# Patient Record
Sex: Female | Born: 1950 | Race: White | Hispanic: No | Marital: Married | Smoking: Never smoker
Health system: Southern US, Community
[De-identification: ages and names within clinical notes are randomized; demographics above are authoritative.]

## PROBLEM LIST (undated history)

## (undated) DIAGNOSIS — S73004A Unspecified dislocation of right hip, initial encounter: Secondary | ICD-10-CM

## (undated) DIAGNOSIS — L719 Rosacea, unspecified: Secondary | ICD-10-CM

## (undated) DIAGNOSIS — K219 Gastro-esophageal reflux disease without esophagitis: Secondary | ICD-10-CM

## (undated) DIAGNOSIS — I7300 Raynaud's syndrome without gangrene: Secondary | ICD-10-CM

## (undated) DIAGNOSIS — M797 Fibromyalgia: Secondary | ICD-10-CM

## (undated) DIAGNOSIS — J309 Allergic rhinitis, unspecified: Secondary | ICD-10-CM

## (undated) DIAGNOSIS — I493 Ventricular premature depolarization: Secondary | ICD-10-CM

## (undated) DIAGNOSIS — L718 Other rosacea: Secondary | ICD-10-CM

## (undated) DIAGNOSIS — G4733 Obstructive sleep apnea (adult) (pediatric): Secondary | ICD-10-CM

## (undated) DIAGNOSIS — M79671 Pain in right foot: Secondary | ICD-10-CM

## (undated) DIAGNOSIS — Z9289 Personal history of other medical treatment: Secondary | ICD-10-CM

## (undated) DIAGNOSIS — G43909 Migraine, unspecified, not intractable, without status migrainosus: Secondary | ICD-10-CM

## (undated) DIAGNOSIS — I10 Essential (primary) hypertension: Secondary | ICD-10-CM

## (undated) DIAGNOSIS — S73005A Unspecified dislocation of left hip, initial encounter: Secondary | ICD-10-CM

## (undated) HISTORY — PX: TOTAL HIP ARTHROPLASTY: SHX124

## (undated) HISTORY — DX: Raynaud's syndrome without gangrene: I73.00

## (undated) HISTORY — PX: VAGINAL HYSTERECTOMY: SUR661

## (undated) HISTORY — DX: Rosacea, unspecified: L71.9

## (undated) HISTORY — PX: HIP SURGERY: SHX245

## (undated) HISTORY — DX: Obstructive sleep apnea (adult) (pediatric): G47.33

## (undated) HISTORY — DX: Other rosacea: L71.8

## (undated) HISTORY — PX: HIP ARTHROPLASTY: SHX981

## (undated) HISTORY — PX: CARDIAC CATHETERIZATION: SHX172

## (undated) HISTORY — DX: Migraine, unspecified, not intractable, without status migrainosus: G43.909

## (undated) HISTORY — DX: Allergic rhinitis, unspecified: J30.9

## (undated) HISTORY — DX: Ventricular premature depolarization: I49.3

## (undated) HISTORY — DX: Unspecified dislocation of right hip, initial encounter: S73.005A

## (undated) HISTORY — DX: Unspecified dislocation of right hip, initial encounter: S73.004A

## (undated) HISTORY — DX: Essential (primary) hypertension: I10

## (undated) HISTORY — PX: BREAST BIOPSY: SHX20

## (undated) HISTORY — DX: Fibromyalgia: M79.7

## (undated) HISTORY — DX: Personal history of other medical treatment: Z92.89

## (undated) HISTORY — DX: Gastro-esophageal reflux disease without esophagitis: K21.9

## (undated) HISTORY — DX: Pain in right foot: M79.671

## (undated) HISTORY — PX: LEG SURGERY: SHX1003

---

## 1998-05-11 ENCOUNTER — Ambulatory Visit (HOSPITAL_COMMUNITY): Admission: RE | Admit: 1998-05-11 | Discharge: 1998-05-11 | Payer: Self-pay | Admitting: Orthopaedic Surgery

## 1999-02-01 ENCOUNTER — Ambulatory Visit (HOSPITAL_COMMUNITY): Admission: RE | Admit: 1999-02-01 | Discharge: 1999-02-01 | Payer: Self-pay | Admitting: *Deleted

## 1999-10-10 ENCOUNTER — Ambulatory Visit (HOSPITAL_COMMUNITY): Admission: RE | Admit: 1999-10-10 | Discharge: 1999-10-10 | Payer: Self-pay | Admitting: *Deleted

## 2000-02-06 ENCOUNTER — Encounter: Payer: Self-pay | Admitting: Family Medicine

## 2000-02-06 ENCOUNTER — Ambulatory Visit (HOSPITAL_COMMUNITY): Admission: RE | Admit: 2000-02-06 | Discharge: 2000-02-06 | Payer: Self-pay | Admitting: Family Medicine

## 2001-02-09 ENCOUNTER — Ambulatory Visit (HOSPITAL_COMMUNITY): Admission: RE | Admit: 2001-02-09 | Discharge: 2001-02-09 | Payer: Self-pay | Admitting: Family Medicine

## 2001-02-09 ENCOUNTER — Encounter: Payer: Self-pay | Admitting: Family Medicine

## 2002-02-11 ENCOUNTER — Ambulatory Visit (HOSPITAL_COMMUNITY): Admission: RE | Admit: 2002-02-11 | Discharge: 2002-02-11 | Payer: Self-pay | Admitting: Obstetrics & Gynecology

## 2002-02-11 ENCOUNTER — Encounter: Payer: Self-pay | Admitting: Obstetrics & Gynecology

## 2002-03-31 ENCOUNTER — Encounter: Payer: Self-pay | Admitting: Orthopedic Surgery

## 2002-04-05 ENCOUNTER — Encounter: Payer: Self-pay | Admitting: Orthopedic Surgery

## 2002-04-05 ENCOUNTER — Inpatient Hospital Stay (HOSPITAL_COMMUNITY): Admission: RE | Admit: 2002-04-05 | Discharge: 2002-04-09 | Payer: Self-pay | Admitting: Orthopedic Surgery

## 2003-03-14 ENCOUNTER — Encounter: Payer: Self-pay | Admitting: Obstetrics & Gynecology

## 2003-03-14 ENCOUNTER — Ambulatory Visit (HOSPITAL_COMMUNITY): Admission: RE | Admit: 2003-03-14 | Discharge: 2003-03-14 | Payer: Self-pay | Admitting: Obstetrics & Gynecology

## 2003-10-12 ENCOUNTER — Other Ambulatory Visit: Admission: RE | Admit: 2003-10-12 | Discharge: 2003-10-12 | Payer: Self-pay | Admitting: Obstetrics & Gynecology

## 2004-04-02 ENCOUNTER — Ambulatory Visit (HOSPITAL_COMMUNITY): Admission: RE | Admit: 2004-04-02 | Discharge: 2004-04-02 | Payer: Self-pay | Admitting: Obstetrics & Gynecology

## 2004-04-05 ENCOUNTER — Encounter: Admission: RE | Admit: 2004-04-05 | Discharge: 2004-04-05 | Payer: Self-pay | Admitting: Obstetrics & Gynecology

## 2004-07-10 ENCOUNTER — Ambulatory Visit (HOSPITAL_COMMUNITY): Admission: RE | Admit: 2004-07-10 | Discharge: 2004-07-10 | Payer: Self-pay | Admitting: Gastroenterology

## 2005-04-03 ENCOUNTER — Ambulatory Visit (HOSPITAL_COMMUNITY): Admission: RE | Admit: 2005-04-03 | Discharge: 2005-04-03 | Payer: Self-pay | Admitting: Obstetrics & Gynecology

## 2005-06-03 ENCOUNTER — Other Ambulatory Visit: Admission: RE | Admit: 2005-06-03 | Discharge: 2005-06-03 | Payer: Self-pay | Admitting: Obstetrics & Gynecology

## 2005-07-25 ENCOUNTER — Encounter: Admission: RE | Admit: 2005-07-25 | Discharge: 2005-07-25 | Payer: Self-pay | Admitting: Obstetrics & Gynecology

## 2006-04-04 ENCOUNTER — Ambulatory Visit (HOSPITAL_COMMUNITY): Admission: RE | Admit: 2006-04-04 | Discharge: 2006-04-04 | Payer: Self-pay | Admitting: Obstetrics & Gynecology

## 2007-09-02 ENCOUNTER — Ambulatory Visit (HOSPITAL_COMMUNITY): Admission: RE | Admit: 2007-09-02 | Discharge: 2007-09-02 | Payer: Self-pay | Admitting: Family Medicine

## 2009-01-05 ENCOUNTER — Ambulatory Visit (HOSPITAL_COMMUNITY): Admission: RE | Admit: 2009-01-05 | Discharge: 2009-01-05 | Payer: Self-pay | Admitting: Obstetrics & Gynecology

## 2009-10-11 ENCOUNTER — Ambulatory Visit (HOSPITAL_BASED_OUTPATIENT_CLINIC_OR_DEPARTMENT_OTHER): Admission: RE | Admit: 2009-10-11 | Discharge: 2009-10-11 | Payer: Self-pay | Admitting: Rheumatology

## 2009-10-15 ENCOUNTER — Ambulatory Visit: Payer: Self-pay | Admitting: Internal Medicine

## 2010-02-15 ENCOUNTER — Ambulatory Visit (HOSPITAL_COMMUNITY): Admission: RE | Admit: 2010-02-15 | Discharge: 2010-02-15 | Payer: Self-pay | Admitting: Obstetrics & Gynecology

## 2010-12-16 ENCOUNTER — Encounter: Payer: Self-pay | Admitting: Obstetrics & Gynecology

## 2011-04-12 NOTE — H&P (Signed)
[facility]  Patient:    Emily Page, Emily Page Visit Number: [id number] MRN: [id number]          Service Type: Attending:  Ollen Gross, M.D. Dictated by:   Druscilla Brownie. Shela Nevin, P.A. Adm. Date:  04/05/02   CC:         Dorthula Perfect, M.D.  Vanita Panda, M.D.   History and Physical  DATE OF BIRTH:  Dec 16, 1950  CHIEF COMPLAINT:  Pain in right hip.  HISTORY OF PRESENT ILLNESS:  The patient is a 60 year old female with hip dysplasia since an infant.  It has progressed to the point where she now has severe problems with her right hip.  She also has problems on the left as well.  Her pain is refractory to nonoperative management.  After much discussion with Dr. Lequita Halt, it was decided the patient would benefit from hip replacement.  She is a relatively young lady, and this hip is markedly interfering with her day-to-day activities.  She has lost range of motion of the right hip with flexion to 95%, rotation to 20, and abduction about 30. She walks with an antalgic gait pattern as well.  She has been seen by Dr. Norlene Campbell for a second opinion, and he agrees that surgical intervention is indicated.  The patient is scheduled for total hip replacement arthroplasty of the right hip.  PAST SURGICAL HISTORY: 1. In 1953, she had bilateral hips casted. 2. Surgical intervention in 1957 for "straightening of the legs." 3. Wisdom teeth removed in 1967. 4. D&C in 1973. 5. Hysterectomy in 1975.  PAST MEDICAL HISTORY: 1. Migraine headaches.  Treated with medications by Dr. Lilian Kapur. 2. Postnasal drip and a dry, tight cough. 3. GERD, and takes the appropriate medication. 4. Fibromyalgia, takes Celebrex for that, which she will stop prior to    surgery.  ALLERGIES:  ZANAFLEX causes hallucinations.  PREMARIN causes nausea. Extremely sensitive to ALL ADHESIVE TAPES.  She is requesting a hypoallergenic tape and, if we do not have  that, then paper tape will be used.  CURRENT MEDICATIONS:  1. ______ 500 mg 1 tablet q.6h. p.r.n., usually takes one at night.  2. Celebrex 200 mg 1 capsule b.i.d.  3. Sonata 10 mg 1 q.d.  4. ______ 20 mg 1 capsule q.d.  5. Nasacort AQ spray 6.5 g 2 sprays each nostril q.d.  6. Astelin nasal spray 137 mcg 2 sprays each nostril b.i.d.  7. Medroxyprogesterone 2.5 mg 1 tablet q.d.  8. Estratest H.S. tablet solution 1 tablet q.d.  9. Vivelle patch 0.1 mg uses twice weekly. 10. Vagifem 25 mcg vaginal tablet use at bedtime two or three times a week. 11. Noritate 1% cream applied to face daily. 12. Relpax 40 mg tablets at start of migraine, repeat in two hours.  That is a     p.r.n. drug.  FAMILY HISTORY:  Positive for heart disease in the father and grandmother. Hypertension in the mother and grandmother.  Diabetes in the father. Grandfather with lymphoma.  Breast cancer in the grandmother.  The grandmother also had strokes and TIAs.  Arthritis in the mother and grandmother.  Paternal grandfather had bleeding ulcers.  SOCIAL HISTORY:  The patient is a married homemaker.  Has two grown children. Has no intake of tobacco products.  Has 0-2 glasses of wine q.d.  She lives in a ranch-style home.  REVIEW OF SYSTEMS:  CNS:  No seizure disorder, paralysis, numbness, or double vision.  The  patient does have migraines.  CARDIOVASCULAR:  No chest pain, no angina, no orthopnea, but she tells me that she has a history of "abnormal EKG" in her previous surgeries back in the 1970s.  CARDIORESPIRATORY:  No productive cough, primarily a dry cough which she attributes to postnasal drip which has been quite chronic.  No hemoptysis, no shortness of breath. GASTROINTESTINAL:  No nausea, vomiting, melena, or bloody stools.  She does quite well when she takes her medications for GERD.  GENITOURINARY:  No discharge, no dysuria, no hematuria.  MUSCULOSKELETAL:  Primarily in present illness.  PHYSICAL  EXAMINATION:  GENERAL:  Alert and cooperative, friendly 60 year old white female who is somewhat apprehensive.  VITAL SIGNS:  Blood pressure 130/82, pulse 60, respirations 12.  HEENT:  Normocephalic.  Oropharynx is clear.  There is some redness in the palatine pillars and some clear vesicles in the posterior pharynx.  No pustules seen.  No injection.  HEART:  Regular rate and rhythm.  No murmurs are heard.  LUNGS:  Clear to auscultation.  No rhonchi, no wheezes.  ABDOMEN:  Soft and nontender.  Spleen not felt.  GENITOURINARY, RECTAL, PELVIC, BREASTS:  Not done.  Not pertinent to present illness.  EXTREMITIES:  Left hip as in present illness above.  ADMISSION DIAGNOSES: 1. 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 2. Migraine headaches. 3. Gastroesophageal reflux disease. 4. Fibromyalgia. 5. 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  PLAN:  The patient will undergo right total hip replacement arthroplasty.  We plan to have home health, utilizing the services of [facility] after surgery.  We must be extremely cautious postoperatively not to use any of the usual tapes. If hypoallergenic tape cannot be found, then we need to use paper tape.  I told her to bring her nasal sprays as well as her creams and her Vagifem to the hospital with her, and we will order this postoperatively.  Due to her fibromyalgia, which is treated quite well with Celebrex, we may want to notify pharmacy and continue her Celebrex postoperatively. Dictated by:   Druscilla Brownie. Shela Nevin, P.A. Attending:  Ollen Gross, M.D. DD:  03/31/02 TD:  04/01/02 Job: [id number]

## 2011-04-12 NOTE — Op Note (Signed)
NAMEROBINETTE, ESTERS                       ACCOUNT NO.:  [id number]   MEDICAL RECORD NO.:  [id number]                   PATIENT TYPE:  AMB   LOCATION:  ENDO                                 FACILITY:  [facility]   PHYSICIAN:  James L. Malon Kindle., M.D.          DATE OF BIRTH:  06-27-1951   DATE OF PROCEDURE:  07/10/2004  DATE OF DISCHARGE:                                 OPERATIVE REPORT   PROCEDURE PERFORMED:  Colonoscopy.   ENDOSCOPIST:  Llana Aliment. Edwards, M.D.   MEDICATIONS:  Fentanyl 90 mcg, Versed 9 mg IV.  Ancef 1 g IV due to previous  hip replacement.   INSTRUMENT USED:  Pediatric Olympus colonoscope.   INDICATIONS FOR PROCEDURE:  Colon cancer screening.   DESCRIPTION OF PROCEDURE:  The procedure had been explained to the patient  and consent obtained.  With the patient in the left lateral decubitus  position, the Olympus scope was inserted and advanced under direct  visualization.  The prep was excellent.  We were able to reach the cecum  using abdominal pressure and position changes.  The scope was withdrawn and  the cecum, ascending colon, transverse colon, splenic flexure, descending  and sigmoid colon were seen well upon removal.  No polyps or other lesions  were seen.  The scope was withdrawn down to the rectum.  The rectum was free  of polyps or other lesions.  The scope was withdrawn.  The patient tolerated  the procedure well.   ASSESSMENT:  Normal screening colonoscopy.   PLAN:  Routine post colonoscopy instructions.  Will recommend yearly  Hemoccults as well.                                               James L. Malon Kindle., M.D.    Waldron Session  D:  07/10/2004  T:  07/10/2004  Job:  [id number]   cc:   Al Decant. Janey Greaser, MD  [address]  [place]  [place] [address]  Fax: [phone]

## 2011-04-12 NOTE — Discharge Summary (Signed)
[facility]  Patient:    Emily Page, Emily Page Visit Number: [id number] MRN: [id number]          Service Type: SUR Location: 4W 0454 01 Attending Physician:  Loanne Drilling Dictated by:   Druscilla Brownie Underwood III, P.A.-C. Admit Date:  04/05/2002 Discharge Date: 04/09/2002   CC:         Jackey Loge, Ph.D.  Vanita Panda, M.D.   Discharge Summary  ADMITTING DIAGNOSES: 1. 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 2. Migraine headaches. 3. Gastroesophageal reflux disease. 4. Fibromyalgia. 5. Extreme sensory ______________ .  DISCHARGE DIAGNOSES: 1. 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 2. Migraine headaches. 3. Gastroesophageal reflux disease. 4. Fibromyalgia. 5. Extreme sensory ______________ . 6. Mild postoperative anemia.  OPERATION:  On Apr 05, 2002, the patient underwent right total hip replacement arthroplasty; French Ana Shuford, P.A.-C. assisted.  CONSULTATIONS:  None.  BRIEF HISTORY OF PRESENT ILLNESS:  This 60 year old lady with hip dysplasia since an infant now has severe problems of the right hip.  Her pain as well as treatment of the right hip nonoperatively has been refractory without much success.  After much discussion, it was felt this patient would benefit with total hip replacement arthroplasty and after a second opinion agreeing with this procedure by Dr. Norlene Campbell surgery was planned.  COURSE IN THE HOSPITAL:  The patient tolerated surgical procedure quite well, was in total hip protocol and eager to progress.  The Hemovac was pulled on the first postop day without difficulty and PCA was discontinued as well. P.o. analgesics controlled this patients discomfort.  She was placed on the total hip protocol and did quite well.  The wound remained dry.  Hemoglobin was stable with the slight expected reduction postoperatively.  Felt this patient could be maintained in home environment and arrangements  were made for discharge.  She will have home health by [place].  She was afebrile and stable at discharge.  LABORATORY DATA:  Laboratory values in the hospital hematologically showed a CBC preoperatively which was 13 and was within normal limits, hemoglobin was 13.9, hematocrit was 40.2.  Final hemoglobin was 11.1 with hematocrit of 32.2. Blood chemistries were in normal limits.  Urinalysis was negative for urinary tract infection.  Electrocardiogram showed normal sinus rhythm and the chest x-ray showed no evidence of active disease in the chest.  CONDITION ON DISCHARGE:  Improved, stable.  PLAN:  The patient was discharged to her home in the care of her family as well as home health.  She is to continue with her home medications and diet. She will have Coumadin for 3 weeks after date of surgery per pharmacy as well as Genevieve Norlander managing this.  Robaxin 500 mg #40 one q.6h. p.r.n. muscle spasm, Vicodin ES #40 with a refill one q.4h. p.r.n. pain, and Benadryl p.r.n.  She will use cortisone cream p.r.n. to her rash area.  A 3-in-1 commode and rolling walker will be used at home. Dictated by:   Druscilla Brownie. Underwood III, P.A.-C. Attending Physician:  Loanne Drilling DD:  04/26/02 TD:  04/27/02 Job: [id number]

## 2011-04-12 NOTE — Op Note (Signed)
[facility]  Patient:    Emily Page, Emily Page Visit Number: [id number] MRN: [id number]          Service Type: SUR Location: 1S Y782 [id number] Attending Physician:  Loanne Drilling Dictated by:   Ollen Gross, M.D. Proc. Date: 04/05/02 Admit Date:  04/05/2002                             Operative Report  PREOPERATIVE DIAGNOSIS:  Right hip dysplasia with degenerative change.  POSTOPERATIVE DIAGNOSIS:  Right hip dysplasia with degenerative change.  PROCEDURE:  Right total hip arthroplasty.  SURGEON:  Ollen Gross, M.D.  ASSISTANT:  Ralene Bathe, P.A.  ANESTHESIA:  General.  ESTIMATED BLOOD LOSS:  300 cc.  DRAIN:  Hemovac x 1.  COMPLICATIONS:  None.  CONDITION:  Stable to recovery.  BRIEF CLINICAL NOTE:  Emily Page is a 60 year old female with severe dysplasia of her right hip and now with degenerative change and progressive subluxation. She has had pain refractory to nonoperative management and presents for total hip arthroplasty.  PROCEDURE IN DETAIL:  After successful administration of general anesthetic, the patient was placed in the left lateral decubitus position with the right side up and held with the hip positioner. The right lower extremity is isolated for her perineum with plastic drapes and prepped and draped in the usual sterile fashion. A standard short posterolateral incision is made with a #10 blade through subcutaneous tissue to the level of the fascia lata which is incised in line with the skin incision. Sciatic nerve is palpated and protected. External rotators isolated off the femur. The capsulectomy is performed and there is a tremendous amount of redundant capsule. Hip is dislocated and center of the femoral head marked. Trial prosthesis is placed such that the center of the trial head corresponds to the center of her native femoral head. An osteotomy line is marked on the femoral neck and then osteotomy made with an oscillating  saw. Femoral head and neck removed. The anterior labrum, which is severely hypertrophic and torn, is removed. Superior labrum is removed. There is a superior bony defect present. Anterior and posterior columns are fine. Acetabular exposure is obtained and the reaming is initiated centrally with a 45 mm reamer coursing in increments of two to a 51 mm. A 52 mm Pinnacle acetabular shell is then placed and approximately 40 degrees of abduction, 20 degrees of forward flexion. There is great coverage on the anterior and posterior column and the overwhelming majority of the superior portion is covered, too. We had a great tight fit once impacted. I then transfixed it with two additional dome screws. I then put the insertion device on there and the cup was moving with the entire pelvis, thus, we had a very stable fit. The 28 mm neutral trial liner is then placed.  Femoral preparation is initiated, first with a canal finder. Canal is irrigated and then axial reaming performed up to 13.5 mm. Proximal reaming is performed until an 18D and the sleeve machined to a small. An 18D small trial sleeve is placed with 18 x 13 stem and a 36 standard neck. A trial 28+0 head is placed. Hip is reduced and there is outstanding stability, full extension, full external rotation, 70 degrees flexion, 40 degrees adduction, 90 degrees internal rotation and 90 degrees flexion, 90 degrees internal rotation. Hip is dislocated, all trials are removed, then a permanent apex hole eliminator and permanent 28 mm neutral  Marathon liner are placed. The permanent 18D small sleeve is placed with an 18 x 13 stem, 36 standard neck, matching her native anteversion. Permanent 28+0 head is placed, hip reduced, and excellent stability again noted. The wound is copiously irrigated with antibiotic solution and short rotators reattached to the femur through drill holes. The fascia lata is closed over Hemovac drain with interrupted #1  Vicryl, subcutaneous closed interrupted 2-0 Vicryl, subcuticular running 4-0 Monocryl. Incision is clean, dry, and Steri-Strips and bulky sterile dressing applied. The patient is awakened and transported to recovery room in stable condition. Dictated by:   Ollen Gross, M.D. Attending Physician:  Loanne Drilling DD:  04/05/02 TD:  04/05/02 Job: [id number]

## 2012-05-12 ENCOUNTER — Other Ambulatory Visit: Payer: Self-pay | Admitting: Obstetrics & Gynecology

## 2012-05-12 DIAGNOSIS — N6310 Unspecified lump in the right breast, unspecified quadrant: Secondary | ICD-10-CM

## 2012-05-18 ENCOUNTER — Ambulatory Visit
Admission: RE | Admit: 2012-05-18 | Discharge: 2012-05-18 | Disposition: A | Discharge status: Home / Self Care | Payer: BC Managed Care – PPO | Source: Ambulatory Visit | Attending: Obstetrics & Gynecology | Admitting: Obstetrics & Gynecology

## 2012-05-18 ENCOUNTER — Other Ambulatory Visit: Payer: Self-pay | Admitting: Obstetrics & Gynecology

## 2012-05-18 DIAGNOSIS — N6310 Unspecified lump in the right breast, unspecified quadrant: Secondary | ICD-10-CM

## 2014-02-02 DIAGNOSIS — R739 Hyperglycemia, unspecified: Secondary | ICD-10-CM | POA: Insufficient documentation

## 2014-03-03 ENCOUNTER — Telehealth: Payer: Self-pay | Admitting: Hematology & Oncology

## 2014-03-03 NOTE — Telephone Encounter (Signed)
Spoke w NEW PATIENT today to remind them of their appointment with Dr. Ennever. Also, advised them to bring all meds and insurance information. ° °

## 2014-03-04 ENCOUNTER — Ambulatory Visit (HOSPITAL_BASED_OUTPATIENT_CLINIC_OR_DEPARTMENT_OTHER): Payer: BC Managed Care – PPO | Admitting: Hematology & Oncology

## 2014-03-04 ENCOUNTER — Ambulatory Visit: Payer: BC Managed Care – PPO

## 2014-03-04 ENCOUNTER — Encounter: Payer: Self-pay | Admitting: Hematology & Oncology

## 2014-03-04 ENCOUNTER — Other Ambulatory Visit (HOSPITAL_BASED_OUTPATIENT_CLINIC_OR_DEPARTMENT_OTHER): Payer: BC Managed Care – PPO | Admitting: Lab

## 2014-03-04 ENCOUNTER — Other Ambulatory Visit (HOSPITAL_COMMUNITY)
Admission: RE | Admit: 2014-03-04 | Discharge: 2014-03-04 | Disposition: A | Discharge status: Home / Self Care | Payer: BC Managed Care – PPO | Source: Ambulatory Visit | Attending: Hematology & Oncology | Admitting: Hematology & Oncology

## 2014-03-04 VITALS — BP 132/62 | HR 61 | Temp 97.9°F | Resp 14 | Ht 62.0 in | Wt 120.0 lb

## 2014-03-04 DIAGNOSIS — D72820 Lymphocytosis (symptomatic): Secondary | ICD-10-CM

## 2014-03-04 LAB — CBC WITH DIFFERENTIAL (CANCER CENTER ONLY)
BASO#: 0 10*3/uL (ref 0.0–0.2)
BASO%: 0.4 % (ref 0.0–2.0)
EOS ABS: 0.1 10*3/uL (ref 0.0–0.5)
EOS%: 1.3 % (ref 0.0–7.0)
HEMATOCRIT: 41.3 % (ref 34.8–46.6)
HEMOGLOBIN: 14 g/dL (ref 11.6–15.9)
LYMPH#: 2.9 10*3/uL (ref 0.9–3.3)
LYMPH%: 53.2 % — AB (ref 14.0–48.0)
MCH: 30.2 pg (ref 26.0–34.0)
MCHC: 33.9 g/dL (ref 32.0–36.0)
MCV: 89 fL (ref 81–101)
MONO#: 0.7 10*3/uL (ref 0.1–0.9)
MONO%: 13 % (ref 0.0–13.0)
NEUT%: 32.1 % — ABNORMAL LOW (ref 39.6–80.0)
NEUTROS ABS: 1.7 10*3/uL (ref 1.5–6.5)
PLATELETS: 239 10*3/uL (ref 145–400)
RBC: 4.63 10*6/uL (ref 3.70–5.32)
RDW: 15.2 % (ref 11.1–15.7)
WBC: 5.4 10*3/uL (ref 3.9–10.0)

## 2014-03-04 LAB — CHCC SATELLITE - SMEAR

## 2014-03-04 NOTE — Progress Notes (Signed)
Referral MD  Reason for Referral: Abnormal blood counts   Chief Complaint  Patient presents with  . NEW PATIENT    high Lymphocytes for last 3 months  : I have not been feeling well. I had mono. My blood counts still or not normal.  HPI: Ms. Emily Page is a very nice 63 year old white female. She does have multiple medical problems. She is on a few medications.  She been doing okay until last October when she began to have some sinus infections. His on antibiotics in October and in December.  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 She apparently had abnormal liver tests. From what she says, her doctor did a mono test which was positive. She was noted to have a high amount of lymphocytes back in January. Addition 72% lymphocytes. Her white cell count was 10.2. She was not anemic. There is no thrombocytopenia.  She's not noted any swollen glands. She says when she has sinus infections, that she does have some swollen glands of her neck. There's been no rashes. She's had no significant weight loss. Her appetite has been okay. She's had no abdominal pain. There's been no change in bowel or bladder habits.  There's been no fever. She's had no bleeding. She's had occasional headaches.     No past medical history on file.:  No past surgical history on file.:  Current outpatient prescriptions:amLODipine (NORVASC) 5 MG tablet, Take 5 mg by mouth daily. , Disp: , Rfl: ;  aspirin 81 MG tablet, Take 81 mg by mouth daily., Disp: , Rfl: ;  azelastine (ASTELIN) 137 MCG/SPRAY nasal spray, Place 2 sprays into both nostrils 2 (two) times daily. Use in each nostril as directed, Disp: , Rfl: ;  diclofenac sodium (VOLTAREN) 1 % GEL, Apply 2 g topically as needed., Disp: , Rfl:  eletriptan (RELPAX) 40 MG tablet, Take 40 mg by mouth as needed for migraine or headache. One tablet by mouth at onset of headache. May repeat in 2 hours, Disp: , Rfl: ;  esomeprazole (NEXIUM) 40 MG capsule, Take by  mouth 2 (two) times daily before a meal. , Disp: , Rfl: ;  fluconazole (DIFLUCAN) 150 MG tablet, Take 150 mg by mouth as needed., Disp: , Rfl:  ketoconazole (NIZORAL) 2 % cream, Apply 1 application topically daily as needed for irritation., Disp: , Rfl: ;  ketoconazole (NIZORAL) 2 % shampoo, Apply 1 application topically 2 (two) times a week. , Disp: , Rfl: ;  LYRICA 50 MG capsule, Take 50 mg by mouth daily. , Disp: , Rfl: ;  magnesium 30 MG tablet, Take 30 mg by mouth 2 (two) times daily., Disp: , Rfl: ;  metronidazole (NORITATE) 1 % cream, Apply 1 % topically daily., Disp: , Rfl:  MINIVELLE 0.1 MG/24HR patch, Place 1 patch onto the skin 2 (two) times a week. , Disp: , Rfl: ;  NASONEX 50 MCG/ACT nasal spray, Place 2 sprays into the nose daily. , Disp: , Rfl: ;  RESTASIS 0.05 % ophthalmic emulsion, Place 1 drop into both eyes 2 (two) times daily. , Disp: , Rfl: ;  VITAMIN A PO, Take 2,000 Units by mouth every morning., Disp: , Rfl: :  :  Allergies  Allergen Reactions  . Penicillins     Swelling of throat  . Sulfa Antibiotics     Swelling of eyes  . Zanaflex [Tizanidine]     halluciations  :  No family history on file.:  History   Social History  .  Marital Status: Married    Spouse Name: N/A    Number of Children: N/A  . Years of Education: N/A   Occupational History  . Not on file.   Social History Main Topics  . Smoking status: Never Smoker   . Smokeless tobacco: Never Used     Comment: never used tobacco  . Alcohol Use: Not on file  . Drug Use: Not on file  . Sexual Activity: Not on file   Other Topics Concern  . Not on file   Social History Narrative  . No narrative on file  :  Pertinent items are noted in HPI.  Exam: @[facility] @  petite white female in no obvious distress. Vital signs show a temperature of 97.9. Pulse 61. Blood pressure 132/60 weight is 120 pounds. Head exam shows no ocular or oral lesions. There is no adenopathy on her neck. There is no scleral  icterus. Lungs are clear. Cardiac exam regular rate and rhythm with a normal S1-S2. There are no murmurs rubs or bruits. Abdomen is soft. She has good bowel sounds. There is no fluid wave. There is a palpable liver or spleen tip. Axillary exam shows no bilateral axillary lymph nodes. Back exam no tenderness over the spine ribs or hips. Extremities shows no clubbing cyanosis or edema. Has good range of motion of her joints. She is no venous cord in her legs. She has good pulses in her distal extremities. Skin exam no rashes ecchymoses or petechia. Neurological exam shows no focal neurological deficits.    Recent Labs  03/04/14 1032  WBC 5.4  HGB 14.0  HCT 41.3  PLT 239   No results found for this basename: NA, K, CL, CO2, GLUCOSE, BUN, CREATININE, CALCIUM,  in the last 72 hours  Blood smear review: Normochromic and normocytic red cells. There is no nucleated red cells. There are no target cells. I see no rouleau formation. White cells show a mild increase in lymphocytes. She has a couple atypical lymphocytes. I see no smudge cells. Myeloid cells appeared normal. There are no hypersegmented polys. This are adequate in number and size.  Pathology: None     Assessment and Plan: Emily Page is a 63 year old female. She has mild lymphocytosis. Her white cell count not elevated. Compared to January, prolymphocytes are coming down.  I am sending her  blood for flow cytometry. I would think however that is probably will be negative.  I still think that the changes in her  CBC are probably reflective of whenever syndrome that she had back in January. I cannot find anything on her physical exam that would suggest an underlying malignancy. I would be surprised if she had a monoclonal population of cells on her flow cytometry.  I spent a good 45 minutes with her and her husband. I answered all their questions. I went over my recommendations. We will see what the flow cytometry shows. If this is positive,  then she will likely need scans and a bone marrow test.  I reassured her. Again, I would think that we are not looking at any underlying marrow abnormalities.  I gave her a prayer blanket. She is very appreciative of this.

## 2014-03-11 LAB — FLOW CYTOMETRY - CHCC SATELLITE

## 2014-03-30 ENCOUNTER — Ambulatory Visit: Payer: BC Managed Care – PPO | Admitting: Cardiology

## 2014-03-31 ENCOUNTER — Encounter: Payer: Self-pay | Admitting: General Surgery

## 2014-03-31 DIAGNOSIS — I493 Ventricular premature depolarization: Secondary | ICD-10-CM

## 2014-03-31 DIAGNOSIS — R079 Chest pain, unspecified: Secondary | ICD-10-CM

## 2014-03-31 DIAGNOSIS — R0789 Other chest pain: Secondary | ICD-10-CM | POA: Insufficient documentation

## 2014-03-31 DIAGNOSIS — I119 Hypertensive heart disease without heart failure: Secondary | ICD-10-CM

## 2014-04-14 ENCOUNTER — Ambulatory Visit (INDEPENDENT_AMBULATORY_CARE_PROVIDER_SITE_OTHER): Payer: BC Managed Care – PPO | Admitting: Cardiology

## 2014-04-14 ENCOUNTER — Encounter: Payer: Self-pay | Admitting: Cardiology

## 2014-04-14 ENCOUNTER — Encounter: Payer: Self-pay | Admitting: *Deleted

## 2014-04-14 VITALS — BP 134/66 | HR 64 | Ht 62.0 in

## 2014-04-14 DIAGNOSIS — K219 Gastro-esophageal reflux disease without esophagitis: Secondary | ICD-10-CM | POA: Insufficient documentation

## 2014-04-14 DIAGNOSIS — M542 Cervicalgia: Secondary | ICD-10-CM | POA: Insufficient documentation

## 2014-04-14 DIAGNOSIS — I493 Ventricular premature depolarization: Secondary | ICD-10-CM

## 2014-04-14 DIAGNOSIS — M2550 Pain in unspecified joint: Secondary | ICD-10-CM | POA: Insufficient documentation

## 2014-04-14 DIAGNOSIS — I7300 Raynaud's syndrome without gangrene: Secondary | ICD-10-CM | POA: Insufficient documentation

## 2014-04-14 DIAGNOSIS — J309 Allergic rhinitis, unspecified: Secondary | ICD-10-CM | POA: Insufficient documentation

## 2014-04-14 DIAGNOSIS — I10 Essential (primary) hypertension: Secondary | ICD-10-CM | POA: Insufficient documentation

## 2014-04-14 DIAGNOSIS — F1990 Other psychoactive substance use, unspecified, uncomplicated: Secondary | ICD-10-CM | POA: Insufficient documentation

## 2014-04-14 DIAGNOSIS — G43009 Migraine without aura, not intractable, without status migrainosus: Secondary | ICD-10-CM | POA: Insufficient documentation

## 2014-04-14 DIAGNOSIS — M25549 Pain in joints of unspecified hand: Secondary | ICD-10-CM | POA: Insufficient documentation

## 2014-04-14 DIAGNOSIS — R5383 Other fatigue: Secondary | ICD-10-CM | POA: Insufficient documentation

## 2014-04-14 DIAGNOSIS — R0789 Other chest pain: Secondary | ICD-10-CM

## 2014-04-14 DIAGNOSIS — I4949 Other premature depolarization: Secondary | ICD-10-CM

## 2014-04-14 DIAGNOSIS — J301 Allergic rhinitis due to pollen: Secondary | ICD-10-CM | POA: Insufficient documentation

## 2014-04-14 LAB — BASIC METABOLIC PANEL
BUN: 17 mg/dL (ref 6–23)
CHLORIDE: 101 meq/L (ref 96–112)
CO2: 28 mEq/L (ref 19–32)
Calcium: 9.2 mg/dL (ref 8.4–10.5)
Creatinine, Ser: 0.8 mg/dL (ref 0.4–1.2)
GFR: 75.79 mL/min (ref >60.00)
Glucose, Bld: 136 mg/dL — ABNORMAL HIGH (ref 70–99)
Potassium: 4 mEq/L (ref 3.5–5.1)
Sodium: 136 mEq/L (ref 135–145)

## 2014-04-14 NOTE — Progress Notes (Addendum)
[address] [place], [place]  [address] Phone: [phone]) [phone] Fax:  [phone]) [phone]  Date:  04/14/2014   ID:  Emily HoltsDeborah J Page, DOB 08-12-1951, MRN [id number]  PCP:  Gaye AlkenBARNES,ELIZABETH STEWART, MD  Cardiologist:  Armanda Magicraci Shabre Kreher ,MD     History of Present Illness: Emily Page is a 63 y.o. female with a history of PVC's and HTN who presents today for followup.  She is doing well.  She occasionally she has chest pain similar to what she had a year ago at the time of her nuclear stress test but she thinks it is better.  She says that it may have happen every 1-2 months off and on for a day for a few minutes and is sharp pain at a pinpoint region in her left breast. She says that back in the fall she had a URI and had to take antibiotics and then her GERD starting acting up. She had severe fatigue at the time as well.  During that time around dinner time she would get palpitations, diaphoresis, nausea and shaking all over before meals.  She also was running a low grade fever.  She went to her PCP at the end of December and it was felt that she had an infection and was treated again with antibiotics and in January she was seen again and her Nexium was increased. She was eventually diagnosed with Mono after her LFTs and WBC were noted to be elevated.  Her symptoms evenatually abated.  She has had some problems with SOB that has gotten worse recently.    Wt Readings from Last 3 Encounters:  03/04/14 120 lb (54.432 kg)     Past Medical History  Diagnosis Date  . Fibromyalgia     followed by Dr Bedelia Personevenshwar  . Allergic rhinitis     followed by Dr Willa RoughHicks in the past  . Migraines     [facility].   . GERD (gastroesophageal reflux disease)     Followed by Dr Randa EvensEdwards in the past  . 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 History of EKG     w palpitations and nonspecific T-wave changes, stress cardiolite 6/08 showed normal LV size in a  systolic function,no maximum effort stress test with moderate work load and good HR achieved,no EKG criteria of ischemia. Did have occassional PVC's  . Raynaud's disease     followed by Dr Corliss Skainseveshwar and norvasc has helped  . Foot pain, right     w intermittent swelling felt to be related to how she is walking on her foot. evaluated by Dr Leticia PennaZiegler (podiatrist)  . Rosacea     and seborrheic dermatitis followed by Dr Levy Sjogrenrowe  . Hypertension   . PVC (premature ventricular contraction)     Current Outpatient Prescriptions  Medication Sig Dispense Refill  . amLODipine (NORVASC) 5 MG tablet Take 5 mg by mouth daily.       Marland Kitchen. aspirin 81 MG tablet Take 81 mg by mouth daily.      Marland Kitchen. azelastine (ASTELIN) 137 MCG/SPRAY nasal spray Place 2 sprays into both nostrils as needed. Use in each nostril as directed      . diclofenac sodium (VOLTAREN) 1 % GEL Apply 2 g topically as needed.      . eletriptan (RELPAX) 40 MG tablet Take 40 mg by mouth as needed for migraine or headache. One tablet by mouth at onset of headache. May  repeat in 2 hours      . esomeprazole (NEXIUM) 40 MG capsule Take by mouth 2 (two) times daily before a meal.       . fluconazole (DIFLUCAN) 150 MG tablet Take 150 mg by mouth as needed.      Marland Kitchen ketoconazole (NIZORAL) 2 % cream Apply 1 application topically daily as needed for irritation.      Marland Kitchen ketoconazole (NIZORAL) 2 % shampoo Apply 1 application topically 2 (two) times a week.       Marland Kitchen LYRICA 50 MG capsule Take 50 mg by mouth daily.       . magnesium 30 MG tablet Take 30 mg by mouth 2 (two) times daily.      . metronidazole (NORITATE) 1 % cream Apply 1 % topically daily.      Marland Kitchen MINIVELLE 0.1 MG/24HR patch Place 1 patch onto the skin 2 (two) times a week.       Marland Kitchen NASONEX 50 MCG/ACT nasal spray Place 2 sprays into the nose daily.       . RESTASIS 0.05 % ophthalmic emulsion Place 1 drop into both eyes 2 (two) times daily.       Marland Kitchen VITAMIN A PO Take 2,000 Units by mouth every morning.        No current facility-administered medications for this visit.    Allergies:    Allergies  Allergen Reactions  . Penicillins     Swelling of throat  . Sulfa Antibiotics     Swelling of eyes  . Zanaflex [Tizanidine]     halluciations  . Amoxil [Amoxicillin]     Felt like throat was swelling  . Nexium [Esomeprazole Magnesium] Nausea Only  . Polytrim [Polymyxin B-Trimethoprim]     Social History:  The patient  reports that she has never smoked. She has never used smokeless tobacco. She reports that she drinks alcohol. She reports that she does not use illicit drugs.   Family History:  The patient's family history is not on file.   ROS:  Please see the history of present illness.      All other systems reviewed and negative.   PHYSICAL EXAM: VS:  BP 134/66  Pulse 64  Ht 5\' 2"  (1.575 m) Well nourished, well developed, in no acute distress HEENT: normal Neck: no JVD Cardiac:  normal S1, S2; RRR; no murmur Lungs:  clear to auscultation bilaterally, no wheezing, rhonchi or rales Abd: soft, nontender, no hepatomegaly Ext: no edema Skin: warm and dry Neuro:  CNs 2-12 intact, no focal abnormalities noted  EKG:  NSR with ST/T wave abnormality in anterior leads - no change from EKG a year ago     ASSESSMENT AND PLAN:  1.  Chronically abnormal EKG with diffuse anterior T wave changes no change from EKG a year ago and no ischemia on nuclear stress test a year ago.  Her CP is atypical and her symptoms have remained stable from last year but she now has SOB that has worsened.  I will get an echo to assess LVF and coronary CTA to assess calcium score and CAD 2.  PVC's - well tolerated 3.  HTN well controlled  Followup with me in 1 year if studies are normal  Signed, Armanda Magic, MD 04/14/2014 2:58 PM   Addendum:  [name] is a patient of mine with a history of abnormal EKG and SOB. She had a normal nuclear stress test a year ago but now has worsening SOB which I am  concerned may be an anginal equivalent. 2D echo was normal. I would like to get a coronary CTA to rule out CAD giving worsening anginal symptoms.

## 2014-04-14 NOTE — Patient Instructions (Signed)
Your physician recommends that you continue on your current medications as directed. Please refer to the Current Medication list given to you today.  Your physician recommends that you go to the lab today for a BMET  Your physician has requested that you have an echocardiogram. Echocardiography is a painless test that uses sound waves to create images of your heart. It provides your doctor with information about the size and shape of your heart and how well your heart's chambers and valves are working. This procedure takes approximately one hour. There are no restrictions for this procedure.  Your physician has requested that you have cardiac CT. Cardiac computed tomography (CT) is a painless test that uses an x-ray machine to take clear, detailed pictures of your heart. For further information please visit [email or web address] Please follow instruction sheet as given.  Your physician wants you to follow-up in: 1 year with Dr Sherlyn Lick will receive a reminder letter in the mail two months in advance. If you don't receive a letter, please call our office to schedule the follow-up appointment.

## 2014-04-20 ENCOUNTER — Ambulatory Visit (HOSPITAL_COMMUNITY)
Admission: RE | Admit: 2014-04-20 | Discharge: 2014-04-20 | Disposition: A | Discharge status: Home / Self Care | Payer: BC Managed Care – PPO | Source: Ambulatory Visit | Attending: Internal Medicine | Admitting: Internal Medicine

## 2014-04-20 DIAGNOSIS — R0789 Other chest pain: Secondary | ICD-10-CM

## 2014-04-20 DIAGNOSIS — R072 Precordial pain: Secondary | ICD-10-CM

## 2014-04-20 DIAGNOSIS — R079 Chest pain, unspecified: Secondary | ICD-10-CM | POA: Insufficient documentation

## 2014-04-20 NOTE — Progress Notes (Signed)
2D Echocardiogram Complete.  04/20/2014   Carri Spillers, RDCS 

## 2014-04-21 ENCOUNTER — Other Ambulatory Visit: Payer: Self-pay | Admitting: *Deleted

## 2014-04-21 MED ORDER — AMLODIPINE BESYLATE 5 MG PO TABS: ORAL_TABLET | ORAL | Status: DC

## 2014-06-09 ENCOUNTER — Other Ambulatory Visit: Payer: Self-pay | Admitting: General Surgery

## 2014-06-09 DIAGNOSIS — R0602 Shortness of breath: Secondary | ICD-10-CM

## 2014-07-04 ENCOUNTER — Ambulatory Visit (HOSPITAL_COMMUNITY): Payer: BC Managed Care – PPO | Attending: Cardiology | Admitting: Radiology

## 2014-07-04 VITALS — BP 139/71 | HR 59 | Ht 62.0 in | Wt 121.0 lb

## 2014-07-04 DIAGNOSIS — Z8249 Family history of ischemic heart disease and other diseases of the circulatory system: Secondary | ICD-10-CM | POA: Insufficient documentation

## 2014-07-04 DIAGNOSIS — R42 Dizziness and giddiness: Secondary | ICD-10-CM | POA: Insufficient documentation

## 2014-07-04 DIAGNOSIS — R002 Palpitations: Secondary | ICD-10-CM | POA: Insufficient documentation

## 2014-07-04 DIAGNOSIS — R079 Chest pain, unspecified: Secondary | ICD-10-CM

## 2014-07-04 DIAGNOSIS — R0602 Shortness of breath: Secondary | ICD-10-CM | POA: Insufficient documentation

## 2014-07-04 MED ORDER — TECHNETIUM TC 99M SESTAMIBI GENERIC - CARDIOLITE
33.0000 | Freq: Once | INTRAVENOUS | Status: AC | PRN
  Administered 2014-07-04: 33 via INTRAVENOUS

## 2014-07-04 MED ORDER — TECHNETIUM TC 99M SESTAMIBI GENERIC - CARDIOLITE
11.0000 | Freq: Once | INTRAVENOUS | Status: AC | PRN
  Administered 2014-07-04: 11 via INTRAVENOUS

## 2014-07-04 MED ORDER — REGADENOSON 0.4 MG/5ML IV SOLN
0.4000 mg | Freq: Once | INTRAVENOUS | Status: AC
  Administered 2014-07-04: 0.4 mg via INTRAVENOUS

## 2014-07-04 NOTE — Progress Notes (Signed)
[facility] SITE 3 NUCLEAR MED [address] [place], [place] [address] [phone]    Cardiology Nuclear Med Study  Emily Page is a 63 y.o. female     MRN : [id number]     DOB: 1951/02/20  Procedure Date: 07/04/2014  Nuclear Med Background Indication for Stress Test:  Evaluation for Ischemia and Abnormal EKG:T wave abnormality History:  No known CAD, PVC's, Echo 2015 EF 55-60%, MPI 2014 (normal) Cardiac Risk Factors: Family History - CAD and Hypertension  Symptoms:  Chest Pain (last date of chest discomfort was two weeks ago), Dizziness, Palpitations and SOB   Nuclear Pre-Procedure Caffeine/Decaff Intake:  None NPO After: 9:30pm   Lungs:  clear O2 Sat: 98% on room air. IV 0.9% NS with Angio Cath:  22g  IV Site: R Forearm  IV Started by:  Cathlyn Parsons, RN  Chest Size (in):  34 Cup Size: B  Height: 5\' 2"  (1.575 m)  Weight:  121 lb (54.885 kg)  BMI:  Body mass index is 22.13 kg/(m^2). Tech Comments:  n/a    Nuclear Med Study 1 or 2 day study: 1 day  Stress Test Type:  Treadmill/Lexiscan  Reading MD: n/a  Order Authorizing Provider:  Gloris Manchester Turner,MD  Resting Radionuclide: Technetium 35m Sestamibi  Resting Radionuclide Dose: 11.0 mCi   Stress Radionuclide:  Technetium 73m Sestamibi  Stress Radionuclide Dose: 33.0 mCi           Stress Protocol Rest HR: 59 Stress HR: 129  Rest BP: 139/71 Stress BP: 123/85  Exercise Time (min): n/a METS: n/a           Dose of Adenosine (mg):  n/a Dose of Lexiscan: 0.4 mg  Dose of Atropine (mg): n/a Dose of Dobutamine: n/a mcg/kg/min (at max HR)  Stress Test Technologist: Nelson Chimes, BS-ES  Nuclear Technologist:  Doyne Keel, CNMT     Rest Procedure:  Myocardial perfusion imaging was performed at rest 45 minutes following the intravenous administration of Technetium 93m Sestamibi. Rest ECG: Sinus bradycardia, anterior and inferior T wave inversion  Stress Procedure:  The patient received IV Lexiscan 0.4 mg  over 15-seconds with concurrent low level exercise and then Technetium 63m Sestamibi was injected at 30-seconds while the patient continued walking one more minute.  Quantitative spect images were obtained after a 45-minute delay.  During the infusion of Lexiscan the patient complained of SOB, lightheadedness and headache.  These symptoms began to ease in recovery.  Stress ECG: No significant ST segment change suggestive of ischemia.  QPS Raw Data Images:  Significant bowel artifact; inferior wall difficult to assess. Stress Images:  There is decreased uptake in the apex. Rest Images:  There is decreased uptake in the apex. Subtraction (SDS):  No evidence of ischemia. Transient Ischemic Dilatation (Normal <1.22):  1.09 Lung/Heart Ratio (Normal <0.45):  0.29  Quantitative Gated Spect Images QGS EDV:  89 ml QGS ESV:  47 ml  Impression Exercise Capacity:  Lexiscan with low level exercise. BP Response:  Normal blood pressure response. Clinical Symptoms:  There is dyspnea. ECG Impression:  No significant ST segment change suggestive of ischemia. Comparison with Prior Nuclear Study: No images to compare  Overall Impression:  Low risk stress nuclear study with a small, mild intensity, fixed apical defect consistent with apical thinning; no ischemia; inferior wall difficult to evaluate due to bowel artifact.  LV Ejection Fraction: 47%.  LV Wall Motion:  Mild global hypokinesis.  Olga Millers

## 2014-07-05 ENCOUNTER — Other Ambulatory Visit: Payer: Self-pay | Admitting: General Surgery

## 2014-07-05 DIAGNOSIS — R9439 Abnormal result of other cardiovascular function study: Secondary | ICD-10-CM

## 2014-07-05 DIAGNOSIS — R0602 Shortness of breath: Secondary | ICD-10-CM

## 2014-07-12 ENCOUNTER — Ambulatory Visit (HOSPITAL_COMMUNITY): Payer: BC Managed Care – PPO | Attending: Cardiology

## 2014-07-12 DIAGNOSIS — R9439 Abnormal result of other cardiovascular function study: Secondary | ICD-10-CM

## 2014-07-12 DIAGNOSIS — R0602 Shortness of breath: Secondary | ICD-10-CM | POA: Diagnosis not present

## 2014-07-12 NOTE — Progress Notes (Signed)
2D Echo completed. 07/12/2014 

## 2014-07-14 ENCOUNTER — Ambulatory Visit (INDEPENDENT_AMBULATORY_CARE_PROVIDER_SITE_OTHER): Payer: BC Managed Care – PPO | Admitting: Cardiology

## 2014-07-14 ENCOUNTER — Encounter: Payer: Self-pay | Admitting: Cardiology

## 2014-07-14 ENCOUNTER — Encounter (INDEPENDENT_AMBULATORY_CARE_PROVIDER_SITE_OTHER): Payer: BC Managed Care – PPO

## 2014-07-14 ENCOUNTER — Encounter: Payer: Self-pay | Admitting: *Deleted

## 2014-07-14 VITALS — BP 124/74 | HR 78 | Ht 62.0 in | Wt 122.0 lb

## 2014-07-14 DIAGNOSIS — R9439 Abnormal result of other cardiovascular function study: Secondary | ICD-10-CM | POA: Insufficient documentation

## 2014-07-14 DIAGNOSIS — I519 Heart disease, unspecified: Secondary | ICD-10-CM | POA: Insufficient documentation

## 2014-07-14 DIAGNOSIS — R0789 Other chest pain: Secondary | ICD-10-CM

## 2014-07-14 DIAGNOSIS — I493 Ventricular premature depolarization: Secondary | ICD-10-CM

## 2014-07-14 DIAGNOSIS — R002 Palpitations: Secondary | ICD-10-CM | POA: Insufficient documentation

## 2014-07-14 DIAGNOSIS — I4949 Other premature depolarization: Secondary | ICD-10-CM

## 2014-07-14 DIAGNOSIS — R0602 Shortness of breath: Secondary | ICD-10-CM | POA: Insufficient documentation

## 2014-07-14 DIAGNOSIS — I10 Essential (primary) hypertension: Secondary | ICD-10-CM

## 2014-07-14 NOTE — Progress Notes (Signed)
[address] [place], [place]  [address] Phone: [phone]) [phone] Fax:  [phone]  Date:  07/14/2014   ID:  Emily HoltsDeborah J Parke, DOB 09-07-1951, MRN [id number]  PCP:  Gaye AlkenBARNES,ELIZABETH STEWART, MD  Cardiologist:  Armanda Magicraci Ebon Ketchum, MD     History of Present Illness: Emily HoltsDeborah J Page is a 63 y.o. female with a history of PVC's and HTN who presents today for followup. She is doing well. When I last saw her she was having some chest pain similar to what she had a year ago at the time of her nuclear stress test but she thought it was better but she was also having problems with SOB. Nuclear stress test showed a small, mild in intensity, fixed apical defect c/w apical thinning with no ischemia and mildly reduced LVF with EF 47%.  Since the stress test she has had a lot of palpitations that are even waking her up at night.  She continues to have CP that is sharp at a pinpoint location to the left of her sternum.  She denies any chest pressure but has been having a lot more SOB mainly when her pulse increases.   Wt Readings from Last 3 Encounters:  07/04/14 121 lb (54.885 kg)  03/04/14 120 lb (54.432 kg)     Past Medical History  Diagnosis Date  . Fibromyalgia     followed by Dr Bedelia Personevenshwar  . Allergic rhinitis     followed by Dr Willa RoughHicks in the past  . Migraines     followed by [facility] in past.   . GERD (gastroesophageal reflux disease)     Followed by Dr Randa EvensEdwards in the past  . 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 History of EKG     w palpitations and nonspecific T-wave changes, stress cardiolite 6/08 showed normal LV size in a systolic function,no maximum effort stress test with moderate work load and good HR achieved,no EKG criteria of ischemia. Did have occassional PVC's  . Raynaud's disease     followed by Dr Corliss Skainseveshwar and norvasc has helped  . Foot pain, right     w intermittent swelling felt to be related to how she is walking on her foot.  evaluated by Dr Leticia PennaZiegler (podiatrist)  . Rosacea     and seborrheic dermatitis followed by Dr Levy Sjogrenrowe  . Hypertension   . PVC (premature ventricular contraction)     Current Outpatient Prescriptions  Medication Sig Dispense Refill  . amLODipine (NORVASC) 5 MG tablet May take 1 1/2 tablets as needed  45 tablet  11  . aspirin 81 MG tablet Take 81 mg by mouth daily.      Marland Kitchen. azelastine (ASTELIN) 137 MCG/SPRAY nasal spray Place 2 sprays into both nostrils as needed. Use in each nostril as directed      . diclofenac sodium (VOLTAREN) 1 % GEL Apply 2 g topically as needed.      . eletriptan (RELPAX) 40 MG tablet Take 40 mg by mouth as needed for migraine or headache. One tablet by mouth at onset of headache. May repeat in 2 hours      . esomeprazole (NEXIUM) 40 MG capsule Take by mouth 2 (two) times daily before a meal.       . fluconazole (DIFLUCAN) 150 MG tablet Take 150 mg by mouth as needed.      Marland Kitchen. ketoconazole (NIZORAL) 2 % cream Apply 1 application topically daily as needed for irritation.      .Marland Kitchen  ketoconazole (NIZORAL) 2 % shampoo Apply 1 application topically 2 (two) times a week.       Marland Kitchen LYRICA 50 MG capsule Take 50 mg by mouth daily.       . magnesium 30 MG tablet Take 30 mg by mouth 2 (two) times daily.      . metronidazole (NORITATE) 1 % cream Apply 1 % topically daily.      Marland Kitchen MINIVELLE 0.1 MG/24HR patch Place 1 patch onto the skin 2 (two) times a week.       Marland Kitchen NASONEX 50 MCG/ACT nasal spray Place 2 sprays into the nose daily.       . RESTASIS 0.05 % ophthalmic emulsion Place 1 drop into both eyes 2 (two) times daily.       Marland Kitchen VITAMIN A PO Take 2,000 Units by mouth every morning.       No current facility-administered medications for this visit.    Allergies:    Allergies  Allergen Reactions  . Penicillins     Swelling of throat  . Sulfa Antibiotics     Swelling of eyes  . Zanaflex [Tizanidine]     halluciations  . Amoxil [Amoxicillin]     Felt like throat was swelling  .  Nexium [Esomeprazole Magnesium] Nausea Only  . Polytrim [Polymyxin B-Trimethoprim]     Social History:  The patient  reports that she has never smoked. She has never used smokeless tobacco. She reports that she drinks alcohol. She reports that she does not use illicit drugs.   Family History:  The patient's family history is not on file.   ROS:  Please see the history of present illness.      All other systems reviewed and negative.   PHYSICAL EXAM: VS:  There were no vitals taken for this visit. Well nourished, well developed, in no acute distress HEENT: normal Neck: no JVD Cardiac:  normal S1, S2; RRR; no murmur Lungs:  clear to auscultation bilaterally, no wheezing, rhonchi or rales Abd: soft, nontender, no hepatomegaly Ext: no edema Skin: warm and dry Neuro:  CNs 2-12 intact, no focal abnormalities noted      ASSESSMENT AND PLAN:  1. Atypical chest pain with apical thinning on recent stress test.  Since she continues to complain of severe SOB (which could be an anginal equivalent)  and EF is mildly reduced on both echo and nuclear stress test as well as a fixed apical defect, I will proceed with Coronary CTA to rule out CAD. 2. Mild LV dysfunction by echo EF 45-50% 3. HTN well controlled 4. PVC's - now with more palpitations.  The palpitations have significantly increased and she says it dose not feel like her PVC's and it wakens her at night. - will get a 30 day Lifewatch heart monitor to assess further.    Followup with me in 6 months  Signed, Armanda Magic, MD 07/14/2014 10:33 AM

## 2014-07-14 NOTE — Patient Instructions (Signed)
Your physician recommends that you continue on your current medications as directed. Please refer to the Current Medication list given to you today.  Your physician has recommended that you wear an event monitor. Event monitors are medical devices that record the heart's electrical activity. Doctors most often [place] these monitors to diagnose arrhythmias. Arrhythmias are problems with the speed or rhythm of the heartbeat. The monitor is a small, portable device. You can wear one while you do your normal daily activities. This is usually used to diagnose what is causing palpitations/syncope (passing out).  Your physician has requested that you have cardiac CT. Cardiac computed tomography (CT) is a painless test that uses an x-ray machine to take clear, detailed pictures of your heart. For further information please visit [email or web address] Please follow instruction sheet as given. ( To Be done at [facility])  Your physician wants you to follow-up in: 6 months with Dr Sherlyn Lick will receive a reminder letter in the mail two months in advance. If you don't receive a letter, please call our office to schedule the follow-up appointment.

## 2014-07-14 NOTE — Addendum Note (Signed)
Addended by: Nita Sells on: 07/14/2014 10:53 AM   Modules accepted: Orders

## 2014-07-14 NOTE — Progress Notes (Signed)
Patient ID: Emily Page, female   DOB: 1951/06/16, 63 y.o.   MRN: [id number] Lifewatch 30 day cardiac event monitor applied to patient.

## 2014-07-18 ENCOUNTER — Telehealth: Payer: Self-pay | Admitting: *Deleted

## 2014-07-18 NOTE — Telephone Encounter (Signed)
Emily Page            Good morning ladies,   Can one of you get this patient in to see Dr. Mayford Knife on Thursday please. Her insurance is not covering Cardiac CTA and per Dr. Mayford Knife she would like to discuss cath.   Thank you so much!      Spoke w/pt.  She will see Dr. Valma Cava 8/27 at 3:15.

## 2014-07-18 NOTE — Telephone Encounter (Signed)
Message copied by Lorayne Bender on Mon Jul 18, 2014  9:53 AM ------      Message from: Carmelina Paddock      Created: Mon Jul 18, 2014  9:20 AM      Regarding: FW: Cardiac CTA       Good morning ladies,            Can one of you get this patient in to see Dr. Mayford Knife on Thursday please.  Her insurance is not covering Cardiac CTA and per Dr. Mayford Knife she would like to discuss cath.            Thank you so much!             ----- Message -----         From: Quintella Reichert, MD         Sent: 07/18/2014   8:59 AM           To: Carmelina Paddock      Subject: RE: Cardiac CTA                                          Patient's insurance will not cover coronary CTA unless she has had a cath first.  Please work her in on Thursday 8/27 to discuss cath      ----- Message -----         From: Carmelina Paddock         Sent: 07/18/2014   8:21 AM           To: Quintella Reichert, MD      Subject: Cardiac CTA                                              Clinicals and myoview have been sent to [employer].            Per RN reviewer her specific health considers Cardiac CTA investigational.            You may call for MD review.  Case is closing tomorrow.            Please Call [phone]      Option 1, option 1 then option 2            Thank you so much!            Morrie Sheldon                   ------

## 2014-07-21 ENCOUNTER — Other Ambulatory Visit: Payer: Self-pay | Admitting: Obstetrics & Gynecology

## 2014-07-21 ENCOUNTER — Encounter: Payer: Self-pay | Admitting: Cardiology

## 2014-07-21 ENCOUNTER — Encounter (INDEPENDENT_AMBULATORY_CARE_PROVIDER_SITE_OTHER): Payer: BC Managed Care – PPO | Admitting: Cardiology

## 2014-07-21 ENCOUNTER — Encounter: Payer: Self-pay | Admitting: General Surgery

## 2014-07-21 VITALS — BP 128/78 | HR 74 | Ht 62.0 in | Wt 122.0 lb

## 2014-07-21 DIAGNOSIS — R0602 Shortness of breath: Secondary | ICD-10-CM

## 2014-07-21 NOTE — Patient Instructions (Signed)
Your physician recommends that you continue on your current medications as directed. Please refer to the Current Medication list given to you today. Your physician has requested that you have a cardiac catheterization. Cardiac catheterization is used to diagnose and/or treat various heart conditions. Doctors may recommend this procedure for a number of different reasons. The most common reason is to evaluate chest pain. Chest pain can be a symptom of coronary artery disease (CAD), and cardiac catheterization can show whether plaque is narrowing or blocking your heart's arteries. This procedure is also used to evaluate the valves, as well as measure the blood flow and oxygen levels in different parts of your heart. For further information please visit [email or web address]. Please follow instruction sheet, as given.   

## 2014-07-21 NOTE — Progress Notes (Signed)
[address] [place], [place]  [address] Phone: [phone] Fax:  [phone]  Date:  07/21/2014   ID:  Emily Page, DOB 10-Apr-1951, MRN [id number]  PCP:  Gaye Alken, MD  Cardiologist:  Armanda Magic, MD     History of Present Illness:  Emily Page is a 63 y.o. female with a history of PVC's and HTN who presents today for followup. She is doing well. When I last saw her she was having some chest pain similar to what she had a year ago at the time of her nuclear stress test but she thought it was better but she was also having problems with SOB. Nuclear stress test showed a small, mild in intensity, fixed apical defect c/w apical thinning with no ischemia and mildly reduced LVF with EF 47%. Since the stress test she has had a lot of palpitations that are even waking her up at night. She continues to have CP that is sharp at a pinpoint location to the left of her sternum. She denies any chest pressure but her main complaint is that she is having a lot more SOB and is worn out.   She was supposed to wear a heart monitor but did not tolerate the patches due to allergic reaction to the electrodes.  She is very frustrated that she is feeling bad.    Wt Readings from Last 3 Encounters:  07/21/14 122 lb (55.339 kg)  07/14/14 122 lb (55.339 kg)  07/04/14 121 lb (54.885 kg)     Past Medical History  Diagnosis Date  . Fibromyalgia     followed by Dr Bedelia Person  . Allergic rhinitis     followed by Dr Willa Rough in the past  . Migraines     followed by [facility] in past.   . GERD (gastroesophageal reflux disease)     Followed by Dr Randa Evens in the past  . 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 History of EKG     w palpitations and nonspecific T-wave changes, stress cardiolite 6/08 showed normal LV size in a systolic function,no maximum effort stress test with moderate work load and good HR achieved,no EKG criteria of ischemia.  Did have occassional PVC's  . Raynaud's disease     followed by Dr Corliss Skains and norvasc has helped  . Foot pain, right     w intermittent swelling felt to be related to how she is walking on her foot. evaluated by Dr Leticia Penna (podiatrist)  . Rosacea     and seborrheic dermatitis followed by Dr Levy Sjogren  . Hypertension   . PVC (premature ventricular contraction)     Current Outpatient Prescriptions  Medication Sig Dispense Refill  . cetirizine (ZYRTEC) 10 MG tablet Take 10 mg by mouth daily.      Marland Kitchen amLODipine (NORVASC) 5 MG tablet May take 1 1/2 tablets as needed  45 tablet  11  . aspirin 81 MG tablet Take 81 mg by mouth daily.      Marland Kitchen azelastine (ASTELIN) 137 MCG/SPRAY nasal spray Place 2 sprays into both nostrils as needed. Use in each nostril as directed      . diclofenac sodium (VOLTAREN) 1 % GEL Apply 2 g topically as needed.      . eletriptan (RELPAX) 40 MG tablet Take 40 mg by mouth as needed for migraine or headache. One tablet by mouth at onset of headache. May repeat in 2 hours      .  esomeprazole (NEXIUM) 40 MG capsule Take by mouth daily.       . fluconazole (DIFLUCAN) 150 MG tablet Take 150 mg by mouth as needed.      Marland Kitchen ketoconazole (NIZORAL) 2 % cream Apply 1 application topically daily as needed for irritation.      Marland Kitchen ketoconazole (NIZORAL) 2 % shampoo Apply 1 application topically daily.       Marland Kitchen LYRICA 50 MG capsule Take 50 mg by mouth daily.       Marland Kitchen MINIVELLE 0.1 MG/24HR patch Place 1 patch onto the skin 2 (two) times a week.       Marland Kitchen NASONEX 50 MCG/ACT nasal spray Place 2 sprays into the nose daily.       Marland Kitchen nystatin-triamcinolone ointment (MYCOLOG) as needed.       . RESTASIS 0.05 % ophthalmic emulsion Place 1 drop into both eyes 2 (two) times daily.       Marland Kitchen VITAMIN A PO Take 2,000 Units by mouth every morning.       No current facility-administered medications for this visit.    Allergies:    Allergies  Allergen Reactions  . Penicillins     Swelling of throat  .  Sulfa Antibiotics     Swelling of eyes  . Zanaflex [Tizanidine]     halluciations  . Amoxil [Amoxicillin]     Felt like throat was swelling  . Polytrim [Polymyxin B-Trimethoprim]     Social History:  The patient  reports that she has never smoked. She has never used smokeless tobacco. She reports that she drinks alcohol. She reports that she does not use illicit drugs.   Family History:  The patient's family history is not on file.   ROS:  Please see the history of present illness.     All other systems reviewed and negative.   PHYSICAL EXAM: VS:  BP 128/78  Pulse 74  Ht 5\' 2"  (1.575 m)  Wt 122 lb (55.339 kg)  BMI 22.31 kg/m2 Well nourished, well developed, in no acute distress HEENT: normal Neck: no JVD Cardiac:  normal S1, S2; RRR; no murmur Lungs:  clear to auscultation bilaterally, no wheezing, rhonchi or rales Abd: soft, nontender, no hepatomegaly Ext: no edema Skin: warm and dry Neuro:  CNs 2-12 intact, no focal abnormalities noted  ASSESSMENT AND PLAN:  1.   Atypical chest pain but significant DOE that is becoming worse.  Her nuclear stress test showed apical thinning with mildly reduced LVF. Since she continues to complain of severe SOB (which could be an anginal equivalent) and EF is mildly reduced on both echo and nuclear stress test as well as a fixed apical defect, and her insurance will not cover coronary CTA, we will proceed with cardiac cath to define coronary anatomy.  Cardiac catheterization was discussed with the patient fully including risks on myocardial infarction, death, stroke, bleeding, arrhythmia, dye allergy, renal insufficiency or bleeding.  All patient questions and concerns were discussed and the patient understands and is willing to proceed.   2.  Mild LV dysfunction by echo EF 45-50% 3.  HTN well controlled 4.  PVC's - now with more palpitations. The palpitations have significantly increased and she says it dose not feel like her PVC's and it wakens  her at night.  She is wearing a heart monitor.  Followup with me in 6 months if heart cath and heart monitor are fine       Signed, Armanda Magic, MD

## 2014-07-22 ENCOUNTER — Encounter (HOSPITAL_COMMUNITY): Payer: Self-pay | Admitting: Pharmacy Technician

## 2014-07-22 LAB — BASIC METABOLIC PANEL
BUN: 11 mg/dL (ref 6–23)
CHLORIDE: 104 meq/L (ref 96–112)
CO2: 30 mEq/L (ref 19–32)
Calcium: 9.2 mg/dL (ref 8.4–10.5)
Creatinine, Ser: 0.7 mg/dL (ref 0.4–1.2)
GFR: 85.38 mL/min (ref >60.00)
GLUCOSE: 150 mg/dL — AB (ref 70–99)
Potassium: 4.5 mEq/L (ref 3.5–5.1)
Sodium: 140 mEq/L (ref 135–145)

## 2014-07-22 LAB — CBC WITH DIFFERENTIAL/PLATELET
BASOS ABS: 0 10*3/uL (ref 0.0–0.1)
Basophils Relative: 0.4 % (ref 0.0–3.0)
EOS PCT: 2.7 % (ref 0.0–5.0)
Eosinophils Absolute: 0.2 10*3/uL (ref 0.0–0.7)
HEMATOCRIT: 41.3 % (ref 36.0–46.0)
Hemoglobin: 13.9 g/dL (ref 12.0–15.0)
LYMPHS ABS: 3.7 10*3/uL (ref 0.7–4.0)
Lymphocytes Relative: 48.7 % — ABNORMAL HIGH (ref 12.0–46.0)
MCHC: 33.8 g/dL (ref 30.0–36.0)
MCV: 92.8 fl (ref 78.0–100.0)
Monocytes Absolute: 0.7 10*3/uL (ref 0.1–1.0)
Monocytes Relative: 8.6 % (ref 3.0–12.0)
Neutro Abs: 3 10*3/uL (ref 1.4–7.7)
Neutrophils Relative %: 39.6 % — ABNORMAL LOW (ref 43.0–77.0)
PLATELETS: 250 10*3/uL (ref 150.0–400.0)
RBC: 4.45 Mil/uL (ref 3.87–5.11)
RDW: 14.5 % (ref 11.5–15.5)
WBC: 7.6 10*3/uL (ref 4.0–10.5)

## 2014-07-22 LAB — CYTOLOGY - PAP

## 2014-07-22 LAB — PROTIME-INR
INR: 1 ratio (ref 0.8–1.0)
Prothrombin Time: 10.9 s (ref 9.6–13.1)

## 2014-07-25 ENCOUNTER — Encounter: Payer: Self-pay | Admitting: General Surgery

## 2014-07-27 ENCOUNTER — Other Ambulatory Visit: Payer: Self-pay | Admitting: Cardiology

## 2014-07-27 DIAGNOSIS — R0602 Shortness of breath: Secondary | ICD-10-CM

## 2014-07-27 NOTE — H&P (Signed)
[address] [place], [place]  [address] Phone: [phone] Fax:  [phone]   Date:  07/14/2014    ID:  Emily Page, DOB [birth year], MRN [id number]   PCP:  Gaye Alken, MD            Cardiologist:  Armanda Magic, MD              History of Present Illness: Emily Page is a 63 y.o. female with a history of PVC's and HTN who presents today for followup. She is doing well. When I last saw her she was having some chest pain similar to what she had a year ago at the time of her nuclear stress test but she thought it was better but she was also having problems with SOB. Nuclear stress test showed a small, mild in intensity, fixed apical defect c/w apical thinning with no ischemia and mildly reduced LVF with EF 47%.  Since the stress test she has had a lot of palpitations that are even waking her up at night.  She continues to have CP that is sharp at a pinpoint location to the left of her sternum.  She denies any chest pressure but has been having a lot more SOB mainly when her pulse increases.      Wt Readings from Last 3 Encounters:   07/04/14  121 lb (54.885 kg)   03/04/14  120 lb (54.432 kg)         Past Medical History   Diagnosis  Date   .  Fibromyalgia         followed by Dr Bedelia Person   .  Allergic rhinitis         followed by Dr Willa Rough in the past   .  Migraines         followed by [facility] in past.    .  GERD (gastroesophageal reflux disease)         Followed by Dr Randa Evens in the past   .  Hip dislocation, bilateral         as infant. was corrected   .  History of EKG         w palpitations and nonspecific T-wave changes, stress cardiolite 6/08 showed normal LV size in a systolic function,no maximum effort stress test with moderate work load and good HR achieved,no EKG criteria of ischemia. Did have occassional PVC's   .  Raynaud's disease         followed by Dr Corliss Skains and norvasc has helped   .  Foot pain,  right         w intermittent swelling felt to be related to how she is walking on her foot. evaluated by Dr Leticia Penna (podiatrist)   .  Rosacea         and seborrheic dermatitis followed by Dr Levy Sjogren   .  Hypertension     .  PVC (premature ventricular contraction)           Current Outpatient Prescriptions   Medication  Sig  Dispense  Refill   .  amLODipine (NORVASC) 5 MG tablet  May take 1 1/2 tablets as needed   45 tablet   11   .  aspirin 81 MG tablet  Take 81 mg by mouth daily.         Marland Kitchen  azelastine (ASTELIN) 137 MCG/SPRAY nasal spray  Place 2 sprays into both nostrils as needed. Use  in each nostril as directed         .  diclofenac sodium (VOLTAREN) 1 % GEL  Apply 2 g topically as needed.         .  eletriptan (RELPAX) 40 MG tablet  Take 40 mg by mouth as needed for migraine or headache. One tablet by mouth at onset of headache. May repeat in 2 hours         .  esomeprazole (NEXIUM) 40 MG capsule  Take by mouth 2 (two) times daily before a meal.          .  fluconazole (DIFLUCAN) 150 MG tablet  Take 150 mg by mouth as needed.         Marland Kitchen  ketoconazole (NIZORAL) 2 % cream  Apply 1 application topically daily as needed for irritation.         Marland Kitchen  ketoconazole (NIZORAL) 2 % shampoo  Apply 1 application topically 2 (two) times a week.          Marland Kitchen  LYRICA 50 MG capsule  Take 50 mg by mouth daily.          .  magnesium 30 MG tablet  Take 30 mg by mouth 2 (two) times daily.         .  metronidazole (NORITATE) 1 % cream  Apply 1 % topically daily.         Marland Kitchen  MINIVELLE 0.1 MG/24HR patch  Place 1 patch onto the skin 2 (two) times a week.          Marland Kitchen  NASONEX 50 MCG/ACT nasal spray  Place 2 sprays into the nose daily.          .  RESTASIS 0.05 % ophthalmic emulsion  Place 1 drop into both eyes 2 (two) times daily.          Marland Kitchen  VITAMIN A PO  Take 2,000 Units by mouth every morning.             No current facility-administered medications for this visit.        Allergies:    Allergies   Allergen   Reactions   .  Penicillins         Swelling of throat   .  Sulfa Antibiotics         Swelling of eyes   .  Zanaflex [Tizanidine]         halluciations   .  Amoxil [Amoxicillin]         Felt like throat was swelling   .  Nexium [Esomeprazole Magnesium]  Nausea Only   .  Polytrim [Polymyxin B-Trimethoprim]          Social History:  The patient  reports that she has never smoked. She has never used smokeless tobacco. She reports that she drinks alcohol. She reports that she does not use illicit drugs.    Family History:  The patient's family history is not on file.    ROS:  Please see the history of present illness.      All other systems reviewed and negative.     PHYSICAL EXAM: VS:  There were no vitals taken for this visit. Well nourished, well developed, in no acute distress  HEENT: normal  Neck: no JVD  Cardiac:  normal S1, S2; RRR; no murmur  Lungs:  clear to auscultation bilaterally, no wheezing, rhonchi or rales  Abd: soft, nontender, no hepatomegaly  Ext: no edema  Skin: warm  and dry  Neuro:  CNs 2-12 intact, no focal abnormalities noted        ASSESSMENT AND PLAN:    Atypical chest pain with apical thinning on recent stress test.  Since she continues to complain of severe SOB (which could be an anginal equivalent)  and EF is mildly reduced on both echo and nuclear stress test as well as a fixed apical defect, I will proceed with Coronary CTA to rule out CAD. Mild LV dysfunction by echo EF 45-50% HTN well controlled PVC's - now with more palpitations.  The palpitations have significantly increased and she says it dose not feel like her PVC's and it wakens her at night. - will get a 30 day [employer] heart monitor to assess further.     Followup with me in 6 months   Signed, Fawaz Borquez, MD 07/14/2014 10:33 AM       

## 2014-07-28 ENCOUNTER — Ambulatory Visit (HOSPITAL_COMMUNITY)
Admission: RE | Admit: 2014-07-28 | Discharge: 2014-07-28 | Disposition: A | Discharge status: Home / Self Care | Payer: BC Managed Care – PPO | Source: Ambulatory Visit | Attending: Cardiology | Admitting: Cardiology

## 2014-07-28 ENCOUNTER — Encounter (HOSPITAL_COMMUNITY)
Admission: RE | Disposition: A | Discharge status: Home / Self Care | Payer: Self-pay | Source: Ambulatory Visit | Attending: Cardiology

## 2014-07-28 DIAGNOSIS — Z7982 Long term (current) use of aspirin: Secondary | ICD-10-CM | POA: Insufficient documentation

## 2014-07-28 DIAGNOSIS — R0989 Other specified symptoms and signs involving the circulatory and respiratory systems: Principal | ICD-10-CM | POA: Insufficient documentation

## 2014-07-28 DIAGNOSIS — I10 Essential (primary) hypertension: Secondary | ICD-10-CM | POA: Diagnosis not present

## 2014-07-28 DIAGNOSIS — R0609 Other forms of dyspnea: Secondary | ICD-10-CM | POA: Diagnosis present

## 2014-07-28 DIAGNOSIS — K219 Gastro-esophageal reflux disease without esophagitis: Secondary | ICD-10-CM | POA: Insufficient documentation

## 2014-07-28 DIAGNOSIS — R0602 Shortness of breath: Secondary | ICD-10-CM

## 2014-07-28 DIAGNOSIS — I209 Angina pectoris, unspecified: Secondary | ICD-10-CM

## 2014-07-28 DIAGNOSIS — R9439 Abnormal result of other cardiovascular function study: Secondary | ICD-10-CM

## 2014-07-28 HISTORY — PX: LEFT HEART CATHETERIZATION WITH CORONARY ANGIOGRAM: SHX5451

## 2014-07-28 SURGERY — LEFT HEART CATHETERIZATION WITH CORONARY ANGIOGRAM
Anesthesia: LOCAL

## 2014-07-28 MED ORDER — SODIUM CHLORIDE 0.9 % IV SOLN
INTRAVENOUS | Status: DC
  Administered 2014-07-28: 10:00:00 via INTRAVENOUS

## 2014-07-28 MED ORDER — SODIUM CHLORIDE 0.9 % IJ SOLN: 3.0000 mL | INTRAMUSCULAR | Status: DC | PRN

## 2014-07-28 MED ORDER — FENTANYL CITRATE 0.05 MG/ML IJ SOLN
INTRAMUSCULAR | Status: AC
  Filled 2014-07-28: qty 2

## 2014-07-28 MED ORDER — MIDAZOLAM HCL 2 MG/2ML IJ SOLN
INTRAMUSCULAR | Status: AC
  Filled 2014-07-28: qty 2

## 2014-07-28 MED ORDER — LIDOCAINE HCL (PF) 1 % IJ SOLN
INTRAMUSCULAR | Status: AC
  Filled 2014-07-28: qty 30

## 2014-07-28 MED ORDER — SODIUM CHLORIDE 0.9 % IV SOLN: 250.0000 mL | INTRAVENOUS | Status: DC | PRN

## 2014-07-28 MED ORDER — ONDANSETRON HCL 4 MG/2ML IJ SOLN: 4.0000 mg | Freq: Four times a day (QID) | INTRAMUSCULAR | Status: DC | PRN

## 2014-07-28 MED ORDER — HEPARIN SODIUM (PORCINE) 1000 UNIT/ML IJ SOLN
INTRAMUSCULAR | Status: AC
  Filled 2014-07-28: qty 1

## 2014-07-28 MED ORDER — HEPARIN (PORCINE) IN NACL 2-0.9 UNIT/ML-% IJ SOLN
INTRAMUSCULAR | Status: AC
  Filled 2014-07-28: qty 1500

## 2014-07-28 MED ORDER — NITROGLYCERIN 0.2 MG/ML ON CALL CATH LAB
INTRAVENOUS | Status: AC
  Filled 2014-07-28: qty 1

## 2014-07-28 MED ORDER — SODIUM CHLORIDE 0.9 % IV SOLN: 1.0000 mL/kg/h | INTRAVENOUS | Status: DC

## 2014-07-28 MED ORDER — ACETAMINOPHEN 325 MG PO TABS: 650.0000 mg | ORAL_TABLET | ORAL | Status: DC | PRN

## 2014-07-28 MED ORDER — VERAPAMIL HCL 2.5 MG/ML IV SOLN
INTRAVENOUS | Status: AC
  Filled 2014-07-28: qty 2

## 2014-07-28 MED ORDER — SODIUM CHLORIDE 0.9 % IJ SOLN: 3.0000 mL | Freq: Two times a day (BID) | INTRAMUSCULAR | Status: DC

## 2014-07-28 MED ORDER — MORPHINE SULFATE 2 MG/ML IJ SOLN
1.0000 mg | INTRAMUSCULAR | Status: DC | PRN
Start: 2014-07-28 — End: 2014-07-28

## 2014-07-28 NOTE — Progress Notes (Signed)
TR BAND REMOVAL  LOCATION:    right radial  DEFLATED PER PROTOCOL:    Yes.    TIME BAND OFF / DRESSING APPLIED:    1445   SITE UPON ARRIVAL:    Level 0  SITE AFTER BAND REMOVAL:    Level 0  CIRCULATION SENSATION AND MOVEMENT:    Within Normal Limits   Yes.    COMMENTS:   TEGADERM DSG APPLIED

## 2014-07-28 NOTE — Discharge Instructions (Signed)
Radial Site Care °Refer to this sheet in the next few weeks. These instructions provide you with information on caring for yourself after your procedure. Your caregiver may also give you more specific instructions. Your treatment has been planned according to current medical practices, but problems sometimes occur. Call your caregiver if you have any problems or questions after your procedure. °HOME CARE INSTRUCTIONS °· You may shower the day after the procedure. Remove the bandage (dressing) and gently wash the site with plain soap and water. Gently pat the site dry. °· Do not apply powder or lotion to the site. °· Do not submerge the affected site in water for 3 to 5 days. °· Inspect the site at least twice daily. °· Do not flex or bend the affected arm for 24 hours. °· No lifting over 5 pounds (2.3 kg) for 5 days after your procedure. °· Do not drive home if you are discharged the same day of the procedure. Have someone else drive you. °· You may drive 24 hours after the procedure unless otherwise instructed by your caregiver. °· Do not operate machinery or power tools for 24 hours. °· A responsible adult should be with you for the first 24 hours after you arrive home. °What to expect: °· Any bruising will usually fade within 1 to 2 weeks. °· Blood that collects in the tissue (hematoma) may be painful to the touch. It should usually decrease in size and tenderness within 1 to 2 weeks. °SEEK IMMEDIATE MEDICAL CARE IF: °· You have unusual pain at the radial site. °· You have redness, warmth, swelling, or pain at the radial site. °· You have drainage (other than a small amount of blood on the dressing). °· You have chills. °· You have a fever or persistent symptoms for more than 72 hours. °· You have a fever and your symptoms suddenly get worse. °· Your arm becomes pale, cool, tingly, or numb. °· You have heavy bleeding from the site. Hold pressure on the site. °Document Released: 12/14/2010 Document Revised:  02/03/2012 Document Reviewed: 12/14/2010 °ExitCare® Patient Information ©2015 [employer]. This information is not intended to replace advice given to you by your health care provider. Make sure you discuss any questions you have with your health care provider. ° °

## 2014-07-28 NOTE — CV Procedure (Signed)
CARDIAC CATHETERIZATION REPORT  NAME:  Emily Page   MRN: [id number] DOB:  1951-05-31   ADMIT DATE: 07/28/2014 Procedure Date: 07/28/2014  INTERVENTIONAL CARDIOLOGIST: Leonie Man, M.D., MS PRIMARY CARE PROVIDER: Gerrit Heck, MD PRIMARY CARDIOLOGIST: Fransico Him  MD  PATIENT:  Emily Page is a 63 y.o. female with a history of PVCs and hypertension who was seen by Dr. Joette Catching for evaluation of intermittent chest pain but mostly exertional dyspnea. She had a stress test performed which showed a fixed apical defect that was probably related to apical thinning. There is no evidence of ischemia EF of 45%. Palpitations keep her up at night but has been having a tremendous amount of exertional dyspnea. Due to her persistent symptoms, Dr. Joette Catching is concerned that this could be representative of her anginal public. She is therefore referred for invasive evaluation to delineate her anatomy  PRE-OPERATIVE DIAGNOSIS:    Class III Exertional Dyspnea/Possible Angina  PROCEDURES PERFORMED:    Left Heart Catheterization with Native Coronary Angiography  via Right Radial Artery   Left Ventriculography  PROCEDURE: The patient was brought to the [facility] in the fasting state and prepped and draped in the usual sterile fashion for  Right Radial  artery access. A modified Allen's test was performed on the right wrist demonstrating excellent collateral flow for radial access.   Sterile technique was used including antiseptics, cap, gloves, gown, hand hygiene, mask and sheet. Skin prep: Chlorhexidine.   Consent: Risks of procedure as well as the alternatives and risks of each were explained to the (patient/caregiver). Consent for procedure obtained.   Time Out: Verified patient identification, verified procedure, site/side was marked, verified correct patient position, special equipment/implants available, medications/allergies/relevent  history reviewed, required imaging and test results available. Performed.  Access:    Right Radial  Artery: 6 Fr Sheath -  Seldinger Technique (Angiocath Micropuncture Kit)  Radial Cocktail - 10 mL; IV Heparin 3000 Units   Left Heart Catheterization: 5 Fr Catheters advanced or exchanged over a Long Exchange Safety J-wire; TIG 4.0 catheter advanced first.  Left and Right Coronary Artery Cineangiography: TIG 4.0  Catheter   LV Hemodynamics (LV Gram): Angled Pigtail  Sheath removed in the cath lab with TR band placement for hemostasis.  TR Band: 1250  Hours; 12 mL air  FINDINGS:  Hemodynamics:   Central Aortic Pressure / Mean: 150/59/83 mmHg  Left Ventricular Pressure / LVEDP:  116/0/3 mmHg  Left Ventriculography:  EF: Roughly 60 %  Wall Motion: Normal  Coronary Anatomy:  Dominance: Right  Left Main: Large-caliber vessel that gives off the LAD and Circumflex. Angiographically normal LAD: Normal caliber vessel that appears to be a branch vessel of the circumflex coming off a good angle. The vessel gives off a proximal diagonal branch which courses down along the anterior wall. It does not reach all the way to the apex. It tapers distally, but no luminal irregularities noted besides the taper.  D1: Moderate caliber proximal branch. Angiographically normal.  Left Circumflex: Very large caliber vessel that gives off a small AV groove branch before terminating as a large lateral OM with 2 small branches. Angiographically normal.   RCA: Large-caliber, dominant vessel that has a high bifurcation and what appears to be the crux of the vessel. There is actually one branch that would appear to be the main RCA terminates as a posterolateral branch with maybe a small PDA branch. The middle appears to be a marginal vessel  that also terminates as a small tandem PDA.  A third moderate caliber branches that are marginal branch. Minimal luminal irregularities distally but otherwise  angiographically normal.  MEDICATIONS:  Anesthesia:  Local Lidocaine 2 ml  Sedation:  2 mg IV Versed, 25 mcg IV fentanyl ;   Omnipaque Contrast: 80 ml  Anticoagulation:  IV Heparin  3000 Units  Radial Cocktail: 5 mg Verapamil, 400 mcg NTG, 2 ml 2% Lidocaine in 10 ml NS  PATIENT DISPOSITION:    The patient was transferred to the PACU holding area in a hemodynamicaly stable, chest pain free condition.  The patient tolerated the procedure well, and there were no complications.  EBL:   < 5 ml  The patient was stable before, during, and after the procedure.  POST-OPERATIVE DIAGNOSIS:    Angiographically normal coronary arteries with no evidence of obstructive disease.  The presence of apical thinning may be related to the small distal LAD.  Normal LV function with normal to low filling pressures.  PLAN OF CARE:  Standard post radial cath care with anticipated discharge later today.  Followup with Dr. Fransico Him and will   Leonie Man, M.D., M.S. [facility] [address] [place], [place]  [address]  [phone]  07/28/2014 12:56 PM

## 2014-07-28 NOTE — H&P (View-Only) (Signed)
[address] [place], [place]  [address] Phone: [phone] Fax:  [phone]   Date:  07/14/2014    ID:  Emily Page, DOB [birth year], MRN [id number]   PCP:  Gaye Alken, MD            Cardiologist:  Armanda Magic, MD              History of Present Illness: Emily Page is a 63 y.o. female with a history of PVC's and HTN who presents today for followup. She is doing well. When I last saw her she was having some chest pain similar to what she had a year ago at the time of her nuclear stress test but she thought it was better but she was also having problems with SOB. Nuclear stress test showed a small, mild in intensity, fixed apical defect c/w apical thinning with no ischemia and mildly reduced LVF with EF 47%.  Since the stress test she has had a lot of palpitations that are even waking her up at night.  She continues to have CP that is sharp at a pinpoint location to the left of her sternum.  She denies any chest pressure but has been having a lot more SOB mainly when her pulse increases.      Wt Readings from Last 3 Encounters:   07/04/14  121 lb (54.885 kg)   03/04/14  120 lb (54.432 kg)         Past Medical History   Diagnosis  Date   .  Fibromyalgia         followed by Dr Bedelia Person   .  Allergic rhinitis         followed by Dr Willa Rough in the past   .  Migraines         followed by [facility] in past.    .  GERD (gastroesophageal reflux disease)         Followed by Dr Randa Evens in the past   .  Hip dislocation, bilateral         as infant. was corrected   .  History of EKG         w palpitations and nonspecific T-wave changes, stress cardiolite 6/08 showed normal LV size in a systolic function,no maximum effort stress test with moderate work load and good HR achieved,no EKG criteria of ischemia. Did have occassional PVC's   .  Raynaud's disease         followed by Dr Corliss Skains and norvasc has helped   .  Foot pain,  right         w intermittent swelling felt to be related to how she is walking on her foot. evaluated by Dr Leticia Penna (podiatrist)   .  Rosacea         and seborrheic dermatitis followed by Dr Levy Sjogren   .  Hypertension     .  PVC (premature ventricular contraction)           Current Outpatient Prescriptions   Medication  Sig  Dispense  Refill   .  amLODipine (NORVASC) 5 MG tablet  May take 1 1/2 tablets as needed   45 tablet   11   .  aspirin 81 MG tablet  Take 81 mg by mouth daily.         Marland Kitchen  azelastine (ASTELIN) 137 MCG/SPRAY nasal spray  Place 2 sprays into both nostrils as needed. Use  in each nostril as directed         .  diclofenac sodium (VOLTAREN) 1 % GEL  Apply 2 g topically as needed.         .  eletriptan (RELPAX) 40 MG tablet  Take 40 mg by mouth as needed for migraine or headache. One tablet by mouth at onset of headache. May repeat in 2 hours         .  esomeprazole (NEXIUM) 40 MG capsule  Take by mouth 2 (two) times daily before a meal.          .  fluconazole (DIFLUCAN) 150 MG tablet  Take 150 mg by mouth as needed.         Marland Kitchen  ketoconazole (NIZORAL) 2 % cream  Apply 1 application topically daily as needed for irritation.         Marland Kitchen  ketoconazole (NIZORAL) 2 % shampoo  Apply 1 application topically 2 (two) times a week.          Marland Kitchen  LYRICA 50 MG capsule  Take 50 mg by mouth daily.          .  magnesium 30 MG tablet  Take 30 mg by mouth 2 (two) times daily.         .  metronidazole (NORITATE) 1 % cream  Apply 1 % topically daily.         Marland Kitchen  MINIVELLE 0.1 MG/24HR patch  Place 1 patch onto the skin 2 (two) times a week.          Marland Kitchen  NASONEX 50 MCG/ACT nasal spray  Place 2 sprays into the nose daily.          .  RESTASIS 0.05 % ophthalmic emulsion  Place 1 drop into both eyes 2 (two) times daily.          Marland Kitchen  VITAMIN A PO  Take 2,000 Units by mouth every morning.             No current facility-administered medications for this visit.        Allergies:    Allergies   Allergen   Reactions   .  Penicillins         Swelling of throat   .  Sulfa Antibiotics         Swelling of eyes   .  Zanaflex [Tizanidine]         halluciations   .  Amoxil [Amoxicillin]         Felt like throat was swelling   .  Nexium [Esomeprazole Magnesium]  Nausea Only   .  Polytrim [Polymyxin B-Trimethoprim]          Social History:  The patient  reports that she has never smoked. She has never used smokeless tobacco. She reports that she drinks alcohol. She reports that she does not use illicit drugs.    Family History:  The patient's family history is not on file.    ROS:  Please see the history of present illness.      All other systems reviewed and negative.     PHYSICAL EXAM: VS:  There were no vitals taken for this visit. Well nourished, well developed, in no acute distress  HEENT: normal  Neck: no JVD  Cardiac:  normal S1, S2; RRR; no murmur  Lungs:  clear to auscultation bilaterally, no wheezing, rhonchi or rales  Abd: soft, nontender, no hepatomegaly  Ext: no edema  Skin: warm  and dry  Neuro:  CNs 2-12 intact, no focal abnormalities noted        ASSESSMENT AND PLAN:    Atypical chest pain with apical thinning on recent stress test.  Since she continues to complain of severe SOB (which could be an anginal equivalent)  and EF is mildly reduced on both echo and nuclear stress test as well as a fixed apical defect, I will proceed with Coronary CTA to rule out CAD. Mild LV dysfunction by echo EF 45-50% HTN well controlled PVC's - now with more palpitations.  The palpitations have significantly increased and she says it dose not feel like her PVC's and it wakens her at night. - will get a 30 day [employer] heart monitor to assess further.     Followup with me in 6 months   Signed, Armanda Magic, MD 07/14/2014 10:33 AM

## 2014-07-28 NOTE — Interval H&P Note (Signed)
History and Physical Interval Note:  07/28/2014 11:58 AM  Emily Page  has presented today for surgery, with the diagnosis of shortness of breath - concerning for Angina.  Stress test was abnormal, but low risk. Low risk stress nuclear study with a small, mild intensity, fixed apical defect consistent with apical thinning; no ischemia; inferior wall difficult to evaluate due to bowel artifact  Principal Problem:   Angina, class III - with Exertional Dyspnea as the Anginal Equivalent. Active Problems:   Exertional shortness of breath     The various methods of treatment have been discussed with the patient and family. After consideration of risks, benefits and other options for treatment, the patient has consented to  Procedure(s): LEFT HEART CATHETERIZATION WITH CORONARY ANGIOGRAM (N/A) as a surgical intervention .  The patient's history has been reviewed, patient examined, no change in status, stable for surgery.  I have reviewed the patient's chart and labs.  Questions were answered to the patient's satisfaction.     Emily Page W  Cath Lab Visit (complete for each Cath Lab visit)  Clinical Evaluation Leading to the Procedure:   ACS: No.  Non-ACS:    Anginal Classification: CCS III  Anti-ischemic medical therapy: Minimal Therapy (1 class of medications)  Non-Invasive Test Results: Low-risk stress test findings: cardiac mortality <1%/year  Prior CABG: No previous CABG

## 2014-08-16 ENCOUNTER — Telehealth: Payer: Self-pay | Admitting: Cardiology

## 2014-08-16 NOTE — Telephone Encounter (Signed)
Please let patient know that heart monitor showed normal sinus rhythm with no arrhythmias

## 2014-08-17 NOTE — Telephone Encounter (Signed)
lmtrc

## 2014-08-19 ENCOUNTER — Encounter: Payer: Self-pay | Admitting: General Surgery

## 2014-08-19 NOTE — Telephone Encounter (Signed)
Letter sent to pt to make aware.  

## 2014-11-03 ENCOUNTER — Encounter (HOSPITAL_COMMUNITY): Payer: Self-pay | Admitting: Cardiology

## 2014-11-14 ENCOUNTER — Encounter: Payer: Self-pay | Admitting: Cardiology

## 2015-01-16 ENCOUNTER — Ambulatory Visit (INDEPENDENT_AMBULATORY_CARE_PROVIDER_SITE_OTHER): Payer: BLUE CROSS/BLUE SHIELD | Admitting: Cardiology

## 2015-01-16 ENCOUNTER — Encounter: Payer: Self-pay | Admitting: Cardiology

## 2015-01-16 VITALS — BP 122/78 | HR 76 | Ht 62.75 in | Wt 126.1 lb

## 2015-01-16 DIAGNOSIS — I119 Hypertensive heart disease without heart failure: Secondary | ICD-10-CM

## 2015-01-16 DIAGNOSIS — I493 Ventricular premature depolarization: Secondary | ICD-10-CM

## 2015-01-16 DIAGNOSIS — R0602 Shortness of breath: Secondary | ICD-10-CM

## 2015-01-16 DIAGNOSIS — I10 Essential (primary) hypertension: Secondary | ICD-10-CM

## 2015-01-16 DIAGNOSIS — R0789 Other chest pain: Secondary | ICD-10-CM

## 2015-01-16 DIAGNOSIS — R002 Palpitations: Secondary | ICD-10-CM

## 2015-01-16 NOTE — Patient Instructions (Addendum)
Your physician has recommended that you wear an event monitor. Event monitors are medical devices that record the heart's electrical activity. Doctors most often [place] these monitors to diagnose arrhythmias. Arrhythmias are problems with the speed or rhythm of the heartbeat. The monitor is a small, portable device. You can wear one while you do your normal daily activities. This is usually used to diagnose what is causing palpitations/syncope (passing out).  Dr. Mayford Knife recommends you have a CARDIOPULMONARY STRESS TEST.  Your physician wants you to follow-up in: 1 year with Dr. Mayford Knife. You will receive a reminder letter in the mail two months in advance. If you don't receive a letter, please call our office to schedule the follow-up appointment.

## 2015-01-16 NOTE — Progress Notes (Signed)
Cardiology Office Note   Date:  01/16/2015   ID:  Emily Page, DOB 1951/03/29, MRN [id number]  PCP:  Gaye Alken, MD  Cardiologist:   Quintella Reichert, MD   No chief complaint on file.     History of Present Illness: Emily Page is a 64 y.o. female with a history of PVC's, noncardiac CP with normal coronary arteries on cath 07/2014 and HTN who presents today for followup. She is doing well. She continues to have chronic chest pain and SOB. The SOB occurs with exercise and with the palpitations.   She is continues to have episodes of palpitations that last about 30 minutes to an hour and feels like her heart is beating fast and hard.     Past Medical History  Diagnosis Date  . Fibromyalgia     followed by Dr Bedelia Person  . Allergic rhinitis     followed by Dr Willa Rough in the past  . Migraines     followed by [facility] in past.   . GERD (gastroesophageal reflux disease)     Followed by Dr Randa Evens in the past  . 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 History of EKG     w palpitations and nonspecific T-wave changes, stress cardiolite 6/08 showed normal LV size in a systolic function,no maximum effort stress test with moderate work load and good HR achieved,no EKG criteria of ischemia. Did have occassional PVC's  . Raynaud's disease     followed by Dr Corliss Skains and norvasc has helped  . Foot pain, right     w intermittent swelling felt to be related to how she is walking on her foot. evaluated by Dr Leticia Penna (podiatrist)  . Rosacea     and seborrheic dermatitis followed by Dr Levy Sjogren  . Hypertension   . PVC (premature ventricular contraction)     Past Surgical History  Procedure Laterality Date  . Left heart catheterization with coronary angiogram N/A 07/28/2014    Procedure: LEFT HEART CATHETERIZATION WITH CORONARY ANGIOGRAM;  Surgeon: Marykay Lex, MD;  Location: [facility] CATH LAB;  Service: Cardiovascular;  Laterality: N/A;       Current Outpatient Prescriptions  Medication Sig Dispense Refill  . amLODipine (NORVASC) 5 MG tablet Take 5 mg by mouth every evening.    Marland Kitchen aspirin EC 81 MG tablet Take 81 mg by mouth every evening.    Marland Kitchen azelastine (ASTELIN) 137 MCG/SPRAY nasal spray Place 2 sprays into both nostrils daily as needed for rhinitis or allergies. Use in each nostril as directed    . cetirizine (ZYRTEC) 10 MG tablet Take 10 mg by mouth daily.    . Cholecalciferol 2000 UNITS TABS Take 2,000 Units by mouth every morning.    . diclofenac sodium (VOLTAREN) 1 % GEL Apply 2 g topically daily as needed (for pain).     Marland Kitchen eletriptan (RELPAX) 40 MG tablet Take 40 mg by mouth daily as needed for migraine or headache. One tablet by mouth at onset of headache. May repeat in 2 hours    . esomeprazole (NEXIUM) 40 MG capsule Take by mouth daily with supper.     . fluconazole (DIFLUCAN) 150 MG tablet Take 150 mg by mouth daily as needed (for yeast infection).     Marland Kitchen ketoconazole (NIZORAL) 2 % cream Apply 1 application topically daily as needed for irritation.    Marland Kitchen ketoconazole (NIZORAL) 2 % shampoo Apply 1 application topically daily.     Marland Kitchen  LYRICA 50 MG capsule Take 50 mg by mouth every evening.     Marland Kitchen MINIVELLE 0.1 MG/24HR patch Place 1 patch onto the skin 2 (two) times a week.     . nystatin-triamcinolone ointment (MYCOLOG) Apply 1 application topically daily as needed (to rash).     Illa Level 80 MCG/ACT AERS Place 1 spray into the nose daily.  0  . RESTASIS 0.05 % ophthalmic emulsion Place 1 drop into both eyes 2 (two) times daily.      No current facility-administered medications for this visit.    Allergies:   Amoxil; Penicillins; Tape; Sulfa antibiotics; Zanaflex; and Polytrim    Social History:  The patient  reports that she has never smoked. She has never used smokeless tobacco. She reports that she drinks alcohol. She reports that she does not use illicit drugs.   Family History:  The patient's family history is  not on file.    ROS:  Please see the history of present illness.   Otherwise, review of systems are positive for none.   All other systems are reviewed and negative.    PHYSICAL EXAM: VS:  BP 122/78 mmHg  Pulse 76  Ht 5' 2.75" (1.594 m)  Wt 126 lb 1.9 oz (57.208 kg)  BMI 22.52 kg/m2  SpO2 98% , BMI Body mass index is 22.52 kg/(m^2). GEN: Well nourished, well developed, in no acute distress HEENT: normal Neck: no JVD, carotid bruits, or masses Cardiac: RRR; no murmurs, rubs, or gallops,no edema  Respiratory:  clear to auscultation bilaterally, normal work of breathing GI: soft, nontender, nondistended, + BS MS: no deformity or atrophy Skin: warm and dry, no rash Neuro:  Strength and sensation are intact Psych: euthymic mood, full affect   EKG:  EKG is not ordered today.    Recent Labs: 07/21/2014: BUN 11; Creatinine 0.7; Hemoglobin 13.9; Platelets 250.0; Potassium 4.5; Sodium 140    Lipid Panel No results found for: CHOL, TRIG, HDL, CHOLHDL, VLDL, LDLCALC, LDLDIRECT    Wt Readings from Last 3 Encounters:  01/16/15 126 lb 1.9 oz (57.208 kg)  07/28/14 120 lb (54.432 kg)  07/21/14 122 lb (55.339 kg)       ASSESSMENT AND PLAN:  1.  Noncardiac chest pain with normal coronary arteries on cath 07/2014 2.  PVC's - now with more palpitations with a very fast and hard heart beat.  She is very sensitive to the electrodes.  I have recommended that we get her an event monitor and when she gets the palpitations she places the heart monitor on since they seem to last several hours at a time. 3.  HTN - controlled 4.  DOE with exercise and with palpitations.  Cath showed normal coronary arteries. Her EF at cath was 60% with normal LVEDP despite low normal LVF on echo.   I will get a cardiopulmonary stress test to evaluate further.  She may have exercise induced asthma.     Current medicines are reviewed at length with the patient today.  The patient does not have concerns regarding  medicines.  The following changes have been made:  no change  Labs/ tests ordered today include: none  No orders of the defined types were placed in this encounter.     Disposition:   FU with me in 1 year   SignedQuintella Reichert, MD  01/16/2015 11:09 AM    [facility] [address] [place], [place], [place]  [address] Phone: [phone]; Fax: [phone]

## 2015-01-19 ENCOUNTER — Encounter (INDEPENDENT_AMBULATORY_CARE_PROVIDER_SITE_OTHER): Payer: BLUE CROSS/BLUE SHIELD

## 2015-01-19 ENCOUNTER — Encounter: Payer: Self-pay | Admitting: *Deleted

## 2015-01-19 DIAGNOSIS — R002 Palpitations: Secondary | ICD-10-CM

## 2015-01-19 NOTE — Progress Notes (Signed)
Patient ID: Emily Page, female   DOB: 11-08-1951, 64 y.o.   MRN: [id number] [employer] 30 day cardiac event monitor applied to patient using 68m Red Dot electrodes.  Patient has history of allergy to electrodes.  [employer] was notified the patient will only be applying monitor when her symptoms occur.  Her symptoms usually last 45 minutes or longer per episode.

## 2015-02-03 ENCOUNTER — Ambulatory Visit (HOSPITAL_COMMUNITY): Payer: BLUE CROSS/BLUE SHIELD | Attending: Cardiology

## 2015-02-03 DIAGNOSIS — R0600 Dyspnea, unspecified: Secondary | ICD-10-CM | POA: Diagnosis not present

## 2015-02-03 DIAGNOSIS — R0602 Shortness of breath: Secondary | ICD-10-CM | POA: Diagnosis not present

## 2015-02-21 ENCOUNTER — Telehealth: Payer: Self-pay | Admitting: Cardiology

## 2015-02-21 NOTE — Telephone Encounter (Signed)
Please let patient know that heart monitor showed NSR with no arrhythmias 

## 2015-02-22 NOTE — Telephone Encounter (Signed)
Left message to call back  

## 2015-02-23 NOTE — Telephone Encounter (Signed)
Left message to call back  

## 2015-03-02 NOTE — Telephone Encounter (Signed)
Left message to call back.    Letter sent to patient to call the office.

## 2015-03-07 NOTE — Telephone Encounter (Signed)
Informed patient of results and verbal understanding expressed.  

## 2015-03-23 ENCOUNTER — Telehealth (HOSPITAL_COMMUNITY): Payer: Self-pay

## 2015-03-23 NOTE — Telephone Encounter (Signed)
I have called and left a message with [place] to inquire about participation in Pulmonary Rehab per Dr. Turner's referral. Will send letter in mail and follow up.  

## 2015-03-27 ENCOUNTER — Telehealth (HOSPITAL_COMMUNITY): Payer: Self-pay

## 2015-03-27 NOTE — Telephone Encounter (Signed)
I have called and left a message with [place] to inquire about participation in Pulmonary Rehab per Dr. Norris Cross referral. Will send letter in mail and follow up.

## 2015-03-30 ENCOUNTER — Telehealth (HOSPITAL_COMMUNITY): Payer: Self-pay

## 2015-03-30 NOTE — Telephone Encounter (Signed)
Called patient regarding entrance to Pulmonary Rehab.  Patient states that they are interested in attending the program.  Emily Page is going to verify insurance coverage and follow up.

## 2015-04-05 ENCOUNTER — Telehealth: Payer: Self-pay | Admitting: Cardiology

## 2015-04-05 NOTE — Telephone Encounter (Signed)
New message         [employer] states the nurse needs to call for pre-authorization before pt can start cardiac rehab

## 2015-04-05 NOTE — Telephone Encounter (Signed)
Calling stating her insurance is telling her that they do not have any information stating that she needs cardiac rehab.  Spoke w/Maria at Cardiac rehab and she will fax over the information to Charmaine to get precert.  Will forward message to Charmaine.

## 2015-04-07 NOTE — Telephone Encounter (Signed)
Called pt's insurance [employer], [phone], call [id number], per Mercie Eon s, No precert required for outpt cardiac rehab, CPT [phone].  Pt has met her $200 deductible and $217.52 of her out of pocket.  Co-insurance is 90/10.  Left msg at cardiac rehab = no precert required.

## 2015-04-13 ENCOUNTER — Telehealth (HOSPITAL_COMMUNITY): Payer: Self-pay | Admitting: *Deleted

## 2015-04-17 ENCOUNTER — Encounter (HOSPITAL_COMMUNITY)
Admission: RE | Admit: 2015-04-17 | Discharge: 2015-04-17 | Disposition: A | Discharge status: Home / Self Care | Payer: BLUE CROSS/BLUE SHIELD | Source: Ambulatory Visit | Attending: Cardiology | Admitting: Cardiology

## 2015-04-17 ENCOUNTER — Encounter (HOSPITAL_COMMUNITY): Payer: Self-pay

## 2015-04-17 VITALS — BP 153/79

## 2015-04-17 DIAGNOSIS — R0602 Shortness of breath: Secondary | ICD-10-CM | POA: Diagnosis present

## 2015-04-17 DIAGNOSIS — E119 Type 2 diabetes mellitus without complications: Secondary | ICD-10-CM | POA: Insufficient documentation

## 2015-04-17 DIAGNOSIS — I5030 Unspecified diastolic (congestive) heart failure: Secondary | ICD-10-CM | POA: Insufficient documentation

## 2015-04-17 DIAGNOSIS — R0600 Dyspnea, unspecified: Secondary | ICD-10-CM

## 2015-04-17 NOTE — Progress Notes (Signed)
Emily Page 64 y.o. female Pulmonary Rehab Orientation Note Patient arrived today in Cardiac and Pulmonary Rehab for orientation to Pulmonary Rehab.  She walked from valet parking unassisted. She does not carry portable oxygen. Per pt, she uses oxygen never. Color good, skin warm and dry. Patient is oriented to time and place. Patient's medical history and medications reviewed. Heart rate is normal, breath sounds clear to auscultation, no wheezes, rales, or rhonchi. Grip strength equal, strong. Distal pulses present bilaterally 2+ without peripheral edema.  Patient reports she does take medications as prescribed. Patient states she follows a Regular diet. The patient reports no specific efforts to gain or lose weight..Patient's weight will be monitored closely. Demonstration and practice of PLB using pulse oximeter. Patient able to return demonstration satisfactorily. Safety and hand hygiene in the exercise area reviewed with patient. Patient voices understanding of the information reviewed. Department expectations discussed with patient and achievable goals were set. The patient shows enthusiasm about attending the program and we look forward to working with this nice lady. The patient is scheduled for a 6 min walk test on Thursday June 2nd at 330 pm. and to begin exercise on Tuesday June 7th at 130 pm.  0930-1400.

## 2015-04-27 ENCOUNTER — Encounter: Payer: Self-pay | Admitting: Cardiology

## 2015-04-27 ENCOUNTER — Encounter (HOSPITAL_COMMUNITY)
Admission: RE | Admit: 2015-04-27 | Discharge: 2015-04-27 | Disposition: A | Discharge status: Home / Self Care | Payer: BLUE CROSS/BLUE SHIELD | Source: Ambulatory Visit | Attending: Cardiology | Admitting: Cardiology

## 2015-04-27 DIAGNOSIS — I5030 Unspecified diastolic (congestive) heart failure: Secondary | ICD-10-CM | POA: Diagnosis not present

## 2015-04-27 DIAGNOSIS — E119 Type 2 diabetes mellitus without complications: Secondary | ICD-10-CM | POA: Insufficient documentation

## 2015-04-27 DIAGNOSIS — R0602 Shortness of breath: Secondary | ICD-10-CM | POA: Diagnosis present

## 2015-04-27 NOTE — Progress Notes (Signed)
Emily Page completed a Six-Minute Walk Test on 04/27/2015 . Emily Page walked 927 feet with 0 breaks.  The patient's lowest oxygen saturation was 95 %, highest heart rate was 114 bpm , and highest blood pressure was 120/80. The patient was on room air. Patient stated that nothing hindered their walk test.

## 2015-05-02 ENCOUNTER — Encounter (HOSPITAL_COMMUNITY)
Admission: RE | Admit: 2015-05-02 | Discharge: 2015-05-02 | Disposition: A | Discharge status: Home / Self Care | Payer: BLUE CROSS/BLUE SHIELD | Source: Ambulatory Visit | Attending: Cardiology | Admitting: Cardiology

## 2015-05-02 DIAGNOSIS — I5030 Unspecified diastolic (congestive) heart failure: Secondary | ICD-10-CM | POA: Diagnosis not present

## 2015-05-02 NOTE — Progress Notes (Signed)
Today, Emily Page exercised at [employer]. Service time was from 1330 to 1500.  The patient exercised by performing aerobic, strengthening, and stretching exercises. Oxygen saturation, heart rate, blood pressure, rate of perceived exertion, and shortness of breath were all monitored before, during, and after exercise. Emily Page presented with no problems at today's exercise session.  The patient did not have an increase in workload intensity during today's exercise session.  Pre-exercise vitals: . Weight kg: 58.8 . Liters of O2: RA . SpO2: 98 . HR: 90 . BP: 106/60 . CBG: NA  Exercise vitals: . Highest heartrate:  121 . Lowest oxygen saturation: 95 . Highest blood pressure: 134/70 . Liters of 02: RA  Post-exercise vitals: . SpO2: 97 . HR: 83 . BP: 108/70 . Liters of O2: RA . CBG: NA Dr. Kalman Shan, Medical Director Dr. Janee Morn is immediately available during today's Pulmonary Rehab session for Emily Page on 05/02/2015  at 1330 class time.  Marland Kitchen

## 2015-05-04 ENCOUNTER — Encounter (HOSPITAL_COMMUNITY)
Admission: RE | Admit: 2015-05-04 | Discharge: 2015-05-04 | Disposition: A | Discharge status: Home / Self Care | Payer: BLUE CROSS/BLUE SHIELD | Source: Ambulatory Visit | Attending: Cardiology | Admitting: Cardiology

## 2015-05-04 DIAGNOSIS — I5030 Unspecified diastolic (congestive) heart failure: Secondary | ICD-10-CM | POA: Diagnosis not present

## 2015-05-04 NOTE — Progress Notes (Signed)
Today, Emily Page exercised at [employer]. Service time was from 1330 to 1515.  The patient exercised by performing aerobic, strengthening, and stretching exercises. Oxygen saturation, heart rate, blood pressure, rate of perceived exertion, and shortness of breath were all monitored before, during, and after exercise. Emily Page presented with no problems at today's exercise session. She attended pursed lip education class with the respiratory therapist today.  The patient did not have an increase in workload intensity during today's exercise session.  Pre-exercise vitals: . Weight kg: 58.9 . Liters of O2: RA . SpO2: 97 . HR: 95 . BP: 112/70 . CBG: NA  Exercise vitals: . Highest heartrate:  96 . Lowest oxygen saturation: 96 . Highest blood pressure: 120/68 . Liters of 02: RA  Post-exercise vitals: . SpO2: 96 . HR: 93 . BP: 102/60 . Liters of O2: RA . CBG: NA Dr. Kalman Shan, Medical Director Dr. Arthor Captain is immediately available during today's Pulmonary Rehab session for Emily Page on 05/04/2015  at 1330 class time.  Marland Kitchen

## 2015-05-09 ENCOUNTER — Encounter (HOSPITAL_COMMUNITY)
Admission: RE | Admit: 2015-05-09 | Discharge: 2015-05-09 | Disposition: A | Discharge status: Home / Self Care | Payer: BLUE CROSS/BLUE SHIELD | Source: Ambulatory Visit | Attending: Cardiology | Admitting: Cardiology

## 2015-05-09 DIAGNOSIS — I5030 Unspecified diastolic (congestive) heart failure: Secondary | ICD-10-CM | POA: Diagnosis not present

## 2015-05-09 NOTE — Progress Notes (Signed)
Today, Melita exercised at [employer]. Service time was from 1330 to 1515.  The patient exercised by performing aerobic, strengthening, and stretching exercises. Oxygen saturation, heart rate, blood pressure, rate of perceived exertion, and shortness of breath were all monitored before, during, and after exercise. Kailah presented with no problems at today's exercise session.  The patient did not have an increase in workload intensity during today's exercise session.  Pre-exercise vitals: . Weight kg: 58.6 . Liters of O2: RA . SpO2: 97 . HR: 109 . BP: 110/76 . CBG: na  Exercise vitals: . Highest heartrate:  115 . Lowest oxygen saturation: 95 . Highest blood pressure: 120/78 . Liters of 02: RA  Post-exercise vitals: . SpO2: 93 . HR: 84 . BP: 118/60 . Liters of O2: RA . CBG: na Dr. Kalman Shan, Medical Director Dr. Arthor Captain is immediately available during today's Pulmonary Rehab session for ARMEDA CHISLOM on 05/09/2015  at 1330 class time.  Marland Kitchen

## 2015-05-11 ENCOUNTER — Encounter (HOSPITAL_COMMUNITY)
Admission: RE | Admit: 2015-05-11 | Discharge: 2015-05-11 | Disposition: A | Discharge status: Home / Self Care | Payer: BLUE CROSS/BLUE SHIELD | Source: Ambulatory Visit | Attending: Cardiology | Admitting: Cardiology

## 2015-05-11 DIAGNOSIS — I5030 Unspecified diastolic (congestive) heart failure: Secondary | ICD-10-CM | POA: Diagnosis not present

## 2015-05-11 NOTE — Progress Notes (Signed)
Today, Emily Page exercised at [employer]. Service time was from 1330 to 1540.  The patient exercised by performing aerobic, strengthening, and stretching exercises. Oxygen saturation, heart rate, blood pressure, rate of perceived exertion, and shortness of breath were all monitored before, during, and after exercise. Emily Page presented with no problems at today's exercise session. Emily Page also attended an education session on oxygen use and safety.  The patient did not have an increase in workload intensity during today's exercise session.  Pre-exercise vitals: . Weight kg: 58.4 . Liters of O2: ra . SpO2: 98 . HR: 98 . BP: 128/70 . CBG: na  Exercise vitals: . Highest heartrate:  113 . Lowest oxygen saturation: 94 . Highest blood pressure: 139/70 . Liters of 02: ra  Post-exercise vitals: . SpO2: 100 . HR: 73 . BP: 124/80 . Liters of O2: ra . CBG: na  Dr. Kalman Shan, Medical Director Dr. Isidoro Donning is immediately available during today's Pulmonary Rehab session for Emily Page on 05/11/2015 at 1330 class time.

## 2015-05-16 ENCOUNTER — Encounter (HOSPITAL_COMMUNITY)
Admission: RE | Admit: 2015-05-16 | Discharge: 2015-05-16 | Disposition: A | Discharge status: Home / Self Care | Payer: BLUE CROSS/BLUE SHIELD | Source: Ambulatory Visit | Attending: Cardiology | Admitting: Cardiology

## 2015-05-16 DIAGNOSIS — I5030 Unspecified diastolic (congestive) heart failure: Secondary | ICD-10-CM | POA: Diagnosis not present

## 2015-05-16 NOTE — Progress Notes (Signed)
Today, [name] exercised at [facility] [facility]. Service time was from 1300 to 1500.  The patient exercised by performing aerobic, strengthening, and stretching exercises. Oxygen saturation, heart rate, blood pressure, rate of perceived exertion, and shortness of breath were all monitored before, during, and after exercise. [name] presented with no problems at today's exercise session.  The patient did have an increase in workload intensity during today's exercise session.  Pre-exercise vitals: . Weight kg: 58.7 . Liters of O2: RA . SpO2: 97 . HR: 90 . BP: 120/62 . CBG: NA  Exercise vitals: . Highest heartrate:  126 . Lowest oxygen saturation: 96 . Highest blood pressure: 122/60 . Liters of 02: RA  Post-exercise vitals: . SpO2: 98 . HR: 76 . BP: 106/60 . Liters of O2: RA . CBG: NA Dr. Kalman Shan, Medical Director Dr. Isidoro Donning is immediately available during today's Pulmonary Rehab session for Emily Page on 05/16/2015  at 1330 class time.  .  .  .

## 2015-05-18 ENCOUNTER — Encounter (HOSPITAL_COMMUNITY)
Admission: RE | Admit: 2015-05-18 | Discharge: 2015-05-18 | Disposition: A | Discharge status: Home / Self Care | Payer: BLUE CROSS/BLUE SHIELD | Source: Ambulatory Visit | Attending: Cardiology | Admitting: Cardiology

## 2015-05-18 DIAGNOSIS — I5030 Unspecified diastolic (congestive) heart failure: Secondary | ICD-10-CM | POA: Diagnosis not present

## 2015-05-18 NOTE — Progress Notes (Signed)
Today, Emily Page exercised at [facility] [facility]. Service time was from 1330 to 1500.  The patient exercised by performing aerobic, strengthening, and stretching exercises. Oxygen saturation, heart rate, blood pressure, rate of perceived exertion, and shortness of breath were all monitored before, during, and after exercise. Emily Page presented with no problems at today's exercise session. She watched Living with Pulmonary Disease video today.  The patient did not have an increase in workload intensity during today's exercise session.  Pre-exercise vitals: . Weight kg: 58.9 . Liters of O2: RA . SpO2: 98 . HR: 78 . BP: 104/64 . CBG: NA  Exercise vitals: . Highest heartrate:  85 . Lowest oxygen saturation: 100 . Highest blood pressure: 120/64 . Liters of 02: RA  Post-exercise vitals: . SpO2: 98 . HR: 78 . BP: 106/70 . Liters of O2: RA . CBG: NA Dr. Kalman Shan, Medical Director Dr. Arthor Captain is immediately available during today's Pulmonary Rehab session for Emily Page on 05/18/2015  at 1330 class time.  Marland Kitchen

## 2015-05-23 ENCOUNTER — Encounter (HOSPITAL_COMMUNITY): Payer: BLUE CROSS/BLUE SHIELD

## 2015-05-25 ENCOUNTER — Encounter (HOSPITAL_COMMUNITY): Admission: RE | Admit: 2015-05-25 | Payer: BLUE CROSS/BLUE SHIELD | Source: Ambulatory Visit

## 2015-05-26 ENCOUNTER — Other Ambulatory Visit: Payer: Self-pay | Admitting: *Deleted

## 2015-05-26 MED ORDER — AMLODIPINE BESYLATE 5 MG PO TABS: 5.0000 mg | ORAL_TABLET | Freq: Every evening | ORAL | Status: DC

## 2015-05-30 ENCOUNTER — Encounter (HOSPITAL_COMMUNITY)
Admission: RE | Admit: 2015-05-30 | Discharge: 2015-05-30 | Disposition: A | Discharge status: Home / Self Care | Payer: BLUE CROSS/BLUE SHIELD | Source: Ambulatory Visit | Attending: Cardiology | Admitting: Cardiology

## 2015-05-30 DIAGNOSIS — E119 Type 2 diabetes mellitus without complications: Secondary | ICD-10-CM | POA: Insufficient documentation

## 2015-05-30 DIAGNOSIS — I5030 Unspecified diastolic (congestive) heart failure: Secondary | ICD-10-CM | POA: Diagnosis not present

## 2015-05-30 DIAGNOSIS — R0602 Shortness of breath: Secondary | ICD-10-CM | POA: Diagnosis present

## 2015-05-30 NOTE — Progress Notes (Signed)
Emily Page 64 y.o. female Nutrition Note Spoke with pt. Pt is at a normal wt. Pt is making healthy food choices the majority of the time. Pt does not use cow's milk or yogurt due to "it's heavy on my stomach and I won't be able to eat all day." Pt does not feel she is lactose intolerant. Pt eats cheese "sparingly." Pt's Rate Your Plate results reviewed with pt. Pt does not avoid salty food; "eats out frequently over the last 2 years due to multiple family deaths and illnesses."  Ways to decrease sodium intake when eating out discussed. Pt does not add salt to food at the table. The role of sodium in lung disease reviewed with pt. Pt expressed understanding of the information reviewed. No results found for: HGBA1C  Nutrition Diagnosis ? Food-and nutrition-related knowledge deficit related to lack of exposure to information as related to diagnosis of pulmonary disease ?  Nutrition Intervention ? Pt's individual nutrition plan and goals reviewed with pt. ? Benefits of adopting healthy eating habits discussed when pt's Rate Your Plate reviewed. ? Handouts given for: Nutrition I and II classes ? Pt to attend the Nutrition and Lung Disease class ? Continual client-centered nutrition education by RD, as part of interdisciplinary care. Goal(s) 1. Pt to identify and limit food sources of sodium. 2. Describe the benefit of including fruits, vegetables, whole grains, and low-fat dairy/dairy alternative products in a healthy meal plan. Monitor and Evaluate progress toward nutrition goal with team.   Mickle Plumb, M.Ed, RD, LDN, CDE 05/30/2015 2:22 PM

## 2015-05-30 NOTE — Progress Notes (Signed)
I have reviewed a Home Exercise Prescription with Emily Page . Emily Page is not currently exercising at home.  The patient was advised to walk 2-3 days a week for 30 minutes.  Emily Page and I discussed how to progress their exercise prescription.  The patient stated that their goals were to be able to increase stamina and establish an exercise regimine.  The patient stated that they understand the exercise prescription.  We reviewed exercise guidelines, target heart rate during exercise, oxygen use, weather, home pulse oximeter, endpoints for exercise, and goals.  Patient is encouraged to come to me with any questions. I will continue to follow up with the patient to assist them with progression and safety.

## 2015-05-30 NOTE — Progress Notes (Signed)
Today, Emily Page exercised at [facility] [facility]. Service time was from 1330 to 1500.  The patient exercised by performing aerobic, strengthening, and stretching exercises. Oxygen saturation, heart rate, blood pressure, rate of perceived exertion, and shortness of breath were all monitored before, during, and after exercise. [name] presented with no problems at today's exercise session.  The patient did have an increase in workload intensity during today's exercise session.  Pre-exercise vitals: . Weight kg: 58.9 . Liters of O2: RA . SpO2: 97 . HR: 98 . BP: 110/80 . CBG: NA  Exercise vitals: . Highest heartrate:  136 . Lowest oxygen saturation: 97 . Highest blood pressure: 110/60 . Liters of 02: RA  Post-exercise vitals: . SpO2: 97 . HR: 94 . BP: 120/60 . Liters of O2: RA . CBG: NA Dr. Kalman Shan, Medical Director Dr. Jerral Ralph is immediately available during today's Pulmonary Rehab session for Emily Page on 05/30/2015  at 1330 class time.  Marland Kitchen

## 2015-06-01 ENCOUNTER — Encounter (HOSPITAL_COMMUNITY)
Admission: RE | Admit: 2015-06-01 | Discharge: 2015-06-01 | Disposition: A | Discharge status: Home / Self Care | Payer: BLUE CROSS/BLUE SHIELD | Source: Ambulatory Visit | Attending: Cardiology | Admitting: Cardiology

## 2015-06-01 DIAGNOSIS — I5030 Unspecified diastolic (congestive) heart failure: Secondary | ICD-10-CM | POA: Diagnosis not present

## 2015-06-01 NOTE — Progress Notes (Signed)
Today, Emily Page exercised at [facility] [employer] [facility]. Service time was from 1330 to 1515.  The patient exercised by performing aerobic, strengthening, and stretching exercises. Oxygen saturation, heart rate, blood pressure, rate of perceived exertion, and shortness of breath were all monitored before, during, and after exercise. Emily Page presented with no problems at today's exercise session. She attended exercise for the pulmonary patient class today.  The patient did not have an increase in workload intensity during today's exercise session.  Pre-exercise vitals: . Weight kg: 58.8 . Liters of O2: RA . SpO2: 97 . HR: 109 . BP: 110/62 . CBG: NA  Exercise vitals: . Highest heartrate:  105 . Lowest oxygen saturation: 96 . Highest blood pressure: 120/64 . Liters of 02: RA  Post-exercise vitals: . SpO2: 98 . HR: 87 . BP: 110/60 . Liters of O2: RA . CBG: NA Dr. Kalman Shan, Medical Director Dr. Vanessa Barbara is immediately available during today's Pulmonary Rehab session for Emily Page on 06/01/2015  at 1330 class time.  Marland Kitchen

## 2015-06-06 ENCOUNTER — Encounter (HOSPITAL_COMMUNITY): Payer: BLUE CROSS/BLUE SHIELD

## 2015-06-08 ENCOUNTER — Encounter (HOSPITAL_COMMUNITY): Payer: BLUE CROSS/BLUE SHIELD

## 2015-06-13 ENCOUNTER — Encounter (HOSPITAL_COMMUNITY)
Admission: RE | Admit: 2015-06-13 | Discharge: 2015-06-13 | Disposition: A | Discharge status: Home / Self Care | Payer: BLUE CROSS/BLUE SHIELD | Source: Ambulatory Visit | Attending: Cardiology | Admitting: Cardiology

## 2015-06-13 DIAGNOSIS — I5030 Unspecified diastolic (congestive) heart failure: Secondary | ICD-10-CM | POA: Diagnosis not present

## 2015-06-13 NOTE — Progress Notes (Signed)
Today, Emily Page exercised at [facility]. Emily [facility]. Service time was from 1330 to 1515.  The patient exercised by performing aerobic, strengthening, and stretching exercises. Oxygen saturation, heart rate, blood pressure, rate of perceived exertion, and shortness of breath were all monitored before, during, and after exercise. Emily Page presented with no problems at today's exercise session.  The patient did not have an increase in workload intensity during today's exercise session.  Pre-exercise vitals: . Weight kg: 59.9 . Liters of O2: RA . SpO2: 100 . HR: 88 . BP: 122/74 . CBG: na  Exercise vitals: . Highest heartrate:  115 . Lowest oxygen saturation: 98 . Highest blood pressure: 130/60 . Liters of 02: RA  Post-exercise vitals: . SpO2: 98 . HR: 87 . BP: 118/78 . Liters of O2: RA . CBG: na Dr. Kalman Shan, Medical Director Dr. Gwenlyn Perking is immediately available during today's Pulmonary Page session for Emily Page on 06/13/2015  at 1330 class time.  Marland Kitchen

## 2015-06-15 ENCOUNTER — Encounter (HOSPITAL_COMMUNITY): Payer: BLUE CROSS/BLUE SHIELD

## 2015-06-20 ENCOUNTER — Encounter (HOSPITAL_COMMUNITY)
Admission: RE | Admit: 2015-06-20 | Discharge: 2015-06-20 | Disposition: A | Discharge status: Home / Self Care | Payer: BLUE CROSS/BLUE SHIELD | Source: Ambulatory Visit | Attending: Cardiology | Admitting: Cardiology

## 2015-06-20 DIAGNOSIS — I5030 Unspecified diastolic (congestive) heart failure: Secondary | ICD-10-CM | POA: Diagnosis not present

## 2015-06-20 NOTE — Progress Notes (Signed)
Today, Emily Page exercised at [facility] [facility]. Service time was from 1330 to 1525.  The patient exercised by performing aerobic, strengthening, and stretching exercises. Oxygen saturation, heart rate, blood pressure, rate of perceived exertion, and shortness of breath were all monitored before, during, and after exercise. Emily Page presented with no problems at today's exercise session. Reviewed pursed lip and diaphragmatic breathing with Emily Page today.  The patient did not have an increase in workload intensity during today's exercise session.  Pre-exercise vitals: . Weight kg: 59.4 . Liters of O2: RA . SpO2: 97 . HR: 89 . BP: 98/58 . CBG: NA  Exercise vitals: . Highest heartrate:  126 . Lowest oxygen saturation: 96 . Highest blood pressure: 118/70 . Liters of 02: RA  Post-exercise vitals: . SpO2: 95 . HR: 99 . BP: 108/64 . Liters of O2: RA . CBG: NA Dr. Kalman Shan, Medical Director Dr. Vanessa Barbara is immediately available during today's Pulmonary Rehab session for Emily Page on 06/20/2015  at 1330 class time.

## 2015-06-22 ENCOUNTER — Encounter (HOSPITAL_COMMUNITY)
Admission: RE | Admit: 2015-06-22 | Discharge: 2015-06-22 | Disposition: A | Discharge status: Home / Self Care | Payer: BLUE CROSS/BLUE SHIELD | Source: Ambulatory Visit | Attending: Cardiology | Admitting: Cardiology

## 2015-06-22 DIAGNOSIS — I5030 Unspecified diastolic (congestive) heart failure: Secondary | ICD-10-CM | POA: Diagnosis not present

## 2015-06-22 NOTE — Progress Notes (Signed)
Today, Emily Page exercised at [facility] [facility]. Service time was from 1230 to 1445.  The patient exercised by performing aerobic, strengthening, and stretching exercises. Oxygen saturation, heart rate, blood pressure, rate of perceived exertion, and shortness of breath were all monitored before, during, and after exercise. Emily Page presented with no problems at today's exercise session. Patient attended the MD Day with Dr. Molli Knock.  The patient did not have an increase in workload intensity during today's exercise session.  Pre-exercise vitals: . Weight kg: 59.3 . Liters of O2: RA . SpO2: 98 . HR: 86 . BP: 124/68 . CBG: na  Exercise vitals: . Highest heartrate:  81 . Lowest oxygen saturation: 98 . Highest blood pressure: 144/74 . Liters of 02: RA  Post-exercise vitals: . SpO2: 98 . HR: 80 . BP: 100/54 . Liters of O2: RA . CBG: na Dr. Kalman Shan, Medical Director Dr. Gwenlyn Perking is immediately available during today's Pulmonary Rehab session for Emily Page on 06/22/2015  at 1330 class time.  Marland Kitchen

## 2015-06-27 ENCOUNTER — Telehealth: Payer: Self-pay

## 2015-06-27 ENCOUNTER — Encounter (HOSPITAL_COMMUNITY)
Admission: RE | Admit: 2015-06-27 | Discharge: 2015-06-27 | Disposition: A | Discharge status: Home / Self Care | Payer: 59 | Source: Ambulatory Visit | Attending: Cardiology | Admitting: Cardiology

## 2015-06-27 DIAGNOSIS — R0602 Shortness of breath: Secondary | ICD-10-CM | POA: Diagnosis present

## 2015-06-27 DIAGNOSIS — E119 Type 2 diabetes mellitus without complications: Secondary | ICD-10-CM | POA: Insufficient documentation

## 2015-06-27 DIAGNOSIS — I5030 Unspecified diastolic (congestive) heart failure: Secondary | ICD-10-CM | POA: Insufficient documentation

## 2015-06-27 NOTE — Progress Notes (Signed)
Today, Emanuel exercised at [facility] [facility]. Service time was from 1330 to 1500.  The patient exercised by performing aerobic, strengthening, and stretching exercises. Oxygen saturation, heart rate, blood pressure, rate of perceived exertion, and shortness of breath were all monitored before, during, and after exercise. Ryilee presented with no problems at today's exercise session.  The patient did  have an increase in workload intensity during today's exercise session.  Pre-exercise vitals: . Weight kg: 59.3 . Liters of O2: RA . SpO2: 97 . HR: 89 . BP: 104/60 . CBG: NA  Exercise vitals: . Highest heartrate:  112 . Lowest oxygen saturation: 97 . Highest blood pressure: 140/70 . Liters of 02: RA  Post-exercise vitals: . SpO2: 97 . HR: 72 . BP: 104/62 . Liters of O2: RA . CBG: NA Dr. Kalman Shan, Medical Director Dr. Gwenlyn Perking is immediately available during today's Pulmonary Rehab session for LORETHA CROMBIE on 06/27/2015  at 1330 class time.  Marland Kitchen

## 2015-06-27 NOTE — Telephone Encounter (Signed)
Needs to be rescheduled     ----- Message -----     From: SYSTEM     Sent: 06/15/2015 12:05 AM      To: Quintella Reichert, MD                 CT Heart Morp W/Cta Cor W/Score W/Ca W/Cm &/Or Wo/Cm  Status: Active Visible to patient:  Not Released Nextappt: 06/29/2015 at 01:30 PM in Cardiac Rehabilitation ([facility]) Dx:  Chest pain, atypical     Patient st she did not get this done last year because her insurance didn't pay enough. She st she will get it done after November when she is covered by Medicare.  She will discuss this with Dr. Mayford Knife at [facility] in September to see if she still wants the test done.

## 2015-06-27 NOTE — Telephone Encounter (Signed)
Patient subsequently had a cath and does not need coronary CT done

## 2015-06-29 ENCOUNTER — Encounter (HOSPITAL_COMMUNITY)
Admission: RE | Admit: 2015-06-29 | Discharge: 2015-06-29 | Disposition: A | Discharge status: Home / Self Care | Payer: 59 | Source: Ambulatory Visit | Attending: Cardiology | Admitting: Cardiology

## 2015-06-29 DIAGNOSIS — I5030 Unspecified diastolic (congestive) heart failure: Secondary | ICD-10-CM | POA: Diagnosis not present

## 2015-06-29 NOTE — Telephone Encounter (Signed)
Informed patient she no longer needs coronary CT. Patient grateful for call.

## 2015-06-29 NOTE — Progress Notes (Signed)
Today, Emily Page exercised at [facility] [facility]. Service time was from 1330 to 1515.  The patient exercised by performing aerobic, strengthening, and stretching exercises. Oxygen saturation, heart rate, blood pressure, rate of perceived exertion, and shortness of breath were all monitored before, during, and after exercise. Emily Page presented with no problems at today's exercise session.She attended risk factor reduction class today.  The patient did not have an increase in workload intensity during today's exercise session.  Pre-exercise vitals: . Weight kg: 59.2 . Liters of O2: RA . SpO2: 98 . HR: 83 . BP: 120/64 . CBG: NA  Exercise vitals: . Highest heartrate:  118 . Lowest oxygen saturation: 96 . Highest blood pressure: 146/60 . Liters of 02: RA  Post-exercise vitals: . SpO2: 99 . HR: 80 . BP: 122/60 . Liters of O2: RA . CBG: NA Dr. Kalman Shan, Medical Director Dr. Carmell Austria is immediately available during today's Pulmonary Rehab session for Emily Page on 06/29/2015  at 1330 class time.  Marland Kitchen

## 2015-07-04 ENCOUNTER — Encounter (HOSPITAL_COMMUNITY): Payer: 59

## 2015-07-06 ENCOUNTER — Encounter (HOSPITAL_COMMUNITY): Payer: 59

## 2015-07-11 ENCOUNTER — Encounter (HOSPITAL_COMMUNITY)
Admission: RE | Admit: 2015-07-11 | Discharge: 2015-07-11 | Disposition: A | Discharge status: Home / Self Care | Payer: 59 | Source: Ambulatory Visit | Attending: Cardiology | Admitting: Cardiology

## 2015-07-11 DIAGNOSIS — I5030 Unspecified diastolic (congestive) heart failure: Secondary | ICD-10-CM | POA: Diagnosis not present

## 2015-07-11 NOTE — Progress Notes (Signed)
Today, Makinzi exercised at [facility] [facility]. Service time was from 1:30pm to 3:05pm.  The patient exercised by performing aerobic, strengthening, and stretching exercises. Oxygen saturation, heart rate, blood pressure, rate of perceived exertion, and shortness of breath were all monitored before, during, and after exercise. Kazaria presented with no problems at today's exercise session.  The patient did not have an increase in workload intensity during today's exercise session.  Pre-exercise vitals: . Weight kg: 59.0 . Liters of O2: ra . SpO2: 92 . HR: 103 . BP: 118/70 . CBG: na  Exercise vitals: . Highest heartrate:  125 . Lowest oxygen saturation: 97 . Highest blood pressure: 130/72 . Liters of 02: ra  Post-exercise vitals: . SpO2: 98 . HR: 104 . BP: 110/68 . Liters of O2: ra . CBG: na  Dr. Kalman Shan, Medical Director Dr. Jerral Ralph is immediately available during today's Pulmonary Rehab session for Emily Page on 07/11/15 at 1:30pm class time.

## 2015-07-13 ENCOUNTER — Encounter (HOSPITAL_COMMUNITY)
Admission: RE | Admit: 2015-07-13 | Discharge: 2015-07-13 | Disposition: A | Discharge status: Home / Self Care | Payer: 59 | Source: Ambulatory Visit | Attending: Cardiology | Admitting: Cardiology

## 2015-07-13 DIAGNOSIS — I5030 Unspecified diastolic (congestive) heart failure: Secondary | ICD-10-CM | POA: Diagnosis not present

## 2015-07-13 NOTE — Progress Notes (Signed)
Today, Britten exercised at [facility] [facility]. Service time was from 1:30 to 3:30pm.  The patient exercised by performing aerobic, strengthening, and stretching exercises. Oxygen saturation, heart rate, blood pressure, rate of perceived exertion, and shortness of breath were all monitored before, during, and after exercise. Hiedi presented with no problems at today's exercise session. The patient attended education class today on Nutrition with Mickle Plumb.   The patient did have an increase in workload intensity during today's exercise session.  Pre-exercise vitals: . Weight kg: 59.2 . Liters of O2: ra . SpO2: 98 . HR: 69 . BP: 130/70 . CBG: na  Exercise vitals: . Highest heartrate:  104 . Lowest oxygen saturation: 92 . Highest blood pressure: 124/70 . Liters of 02: ra  Post-exercise vitals: . SpO2: 97 . HR: 75 . BP: 100/60 . Liters of O2: ra . CBG: na  Dr. Kalman Shan, Medical Director Dr. Jerral Ralph is immediately available during today's Pulmonary Rehab session for KROSBY BRING on 07/13/15 at 1:30pm class time.

## 2015-07-18 ENCOUNTER — Encounter (HOSPITAL_COMMUNITY): Payer: 59

## 2015-07-20 ENCOUNTER — Encounter (HOSPITAL_COMMUNITY): Payer: 59

## 2015-07-24 ENCOUNTER — Telehealth (HOSPITAL_COMMUNITY): Payer: Self-pay | Admitting: *Deleted

## 2015-07-25 ENCOUNTER — Encounter (HOSPITAL_COMMUNITY): Payer: 59

## 2015-07-27 ENCOUNTER — Encounter (HOSPITAL_COMMUNITY): Payer: 59

## 2015-07-27 ENCOUNTER — Encounter (HOSPITAL_COMMUNITY)
Admission: RE | Admit: 2015-07-27 | Discharge: 2015-07-27 | Disposition: A | Discharge status: Home / Self Care | Payer: 59 | Source: Ambulatory Visit | Attending: Cardiology | Admitting: Cardiology

## 2015-07-27 DIAGNOSIS — I5030 Unspecified diastolic (congestive) heart failure: Secondary | ICD-10-CM | POA: Insufficient documentation

## 2015-07-27 DIAGNOSIS — R0602 Shortness of breath: Secondary | ICD-10-CM | POA: Diagnosis present

## 2015-07-27 DIAGNOSIS — E119 Type 2 diabetes mellitus without complications: Secondary | ICD-10-CM | POA: Diagnosis not present

## 2015-07-27 NOTE — Progress Notes (Signed)
Emily Page completed a Six-Minute Walk Test on 07/27/15 . Emily Page walked 1498 feet with 0 breaks.  The patient's lowest oxygen saturation was 89 %, highest heart rate was 132 bpm , and highest blood pressure was 138/80. The patient was on room air. Patient stated that nothing hindered their walk test.

## 2015-08-01 ENCOUNTER — Encounter (HOSPITAL_COMMUNITY): Payer: 59

## 2015-08-03 ENCOUNTER — Encounter (HOSPITAL_COMMUNITY): Payer: 59

## 2015-08-08 ENCOUNTER — Encounter (HOSPITAL_COMMUNITY): Payer: 59

## 2015-08-10 ENCOUNTER — Encounter (HOSPITAL_COMMUNITY): Payer: 59

## 2015-08-15 NOTE — Progress Notes (Signed)
Pulmonary Rehab Discharge Note Emily Page has graduated from the pulmonary rehab program.  She did increase her 6 minute walk test distance 38%.  She attended 15 sessions and was graduated sooner due to her family commitments which was requiring her to go out of town and miss exercise sessions.  She did gain knowledge re:how to handle her dyspnea,  how to purse lip breathe, and how to exercise safely using our program guidelines.   She will continue exercising on her own at home.  She was a pleasure to work with.

## 2015-08-15 NOTE — Addendum Note (Signed)
Encounter addended by: Drema Pry, RN on: 08/15/2015 12:47 PM<BR>     Documentation filed: Notes Section

## 2015-08-25 ENCOUNTER — Ambulatory Visit: Payer: BLUE CROSS/BLUE SHIELD | Admitting: Cardiology

## 2015-08-28 ENCOUNTER — Ambulatory Visit (INDEPENDENT_AMBULATORY_CARE_PROVIDER_SITE_OTHER): Payer: 59 | Admitting: Cardiology

## 2015-08-28 ENCOUNTER — Encounter: Payer: Self-pay | Admitting: Cardiology

## 2015-08-28 VITALS — BP 140/64 | HR 74 | Ht 62.75 in | Wt 130.2 lb

## 2015-08-28 DIAGNOSIS — I493 Ventricular premature depolarization: Secondary | ICD-10-CM

## 2015-08-28 DIAGNOSIS — I10 Essential (primary) hypertension: Secondary | ICD-10-CM | POA: Diagnosis not present

## 2015-08-28 DIAGNOSIS — R0602 Shortness of breath: Secondary | ICD-10-CM | POA: Diagnosis not present

## 2015-08-28 DIAGNOSIS — R0789 Other chest pain: Secondary | ICD-10-CM | POA: Diagnosis not present

## 2015-08-28 NOTE — Progress Notes (Signed)
Cardiology Office Note   Date:  08/28/2015   ID:  KATELY Page, DOB Feb 09, 1951, MRN [id number]  PCP:  Gaye Alken, MD    Chief Complaint  Patient presents with  . Hypertension      History of Present Illness: Emily Page is a 64 y.o. female with a history of PVC's, noncardiac CP with normal coronary arteries on cath 07/2014 and HTN who presents today for followup. She is doing well. She had a cardiopulmonary stress test done which showed a mild functional capacity consistent with primary cardiac limitation.  She was placed in Cardiac Rehab for 3 months but did not notice that she had any improvement in the CP.  Her SOB is mildly improved.  She has been using a pulse oximetry and her O2 sats drop to a low of 95%.  The SOB occurs with exercise and she will notice racing of her heart beat. She has mild LE edema.    Past Medical History  Diagnosis Date  . Fibromyalgia     followed by Dr Bedelia Person  . Allergic rhinitis     followed by Dr Willa Rough in the past  . Migraines     [facility].   . GERD (gastroesophageal reflux disease)     Followed by Dr Randa Evens in the past  . 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 History of EKG     w palpitations and nonspecific T-wave changes, stress cardiolite 6/08 showed normal LV size in a systolic function,no maximum effort stress test with moderate work load and good HR achieved,no EKG criteria of ischemia. Did have occassional PVC's  . Raynaud's disease     followed by Dr Corliss Skains and norvasc has helped  . Foot pain, right     w intermittent swelling felt to be related to how she is walking on her foot. evaluated by Dr Leticia Penna (podiatrist)  . Rosacea     and seborrheic dermatitis followed by Dr Levy Sjogren  . Hypertension   . PVC (premature ventricular contraction)     Past Surgical History  Procedure Laterality Date  . Left heart catheterization  with coronary angiogram N/A 07/28/2014    Procedure: LEFT HEART CATHETERIZATION WITH CORONARY ANGIOGRAM;  Surgeon: Marykay Lex, MD;  Location: [facility];  Service: Cardiovascular;  Laterality: N/A;  . Cardiac catheterization       Current Outpatient Prescriptions  Medication Sig Dispense Refill  . amLODipine (NORVASC) 5 MG tablet Take 1 tablet (5 mg total) by mouth every evening. 30 tablet 5  . aspirin EC 81 MG tablet Take 81 mg by mouth every evening.    Marland Kitchen azelastine (ASTELIN) 137 MCG/SPRAY nasal spray Place 2 sprays into both nostrils daily as needed for rhinitis or allergies. Use in each nostril as directed    . cetirizine (ZYRTEC) 10 MG tablet Take 10 mg by mouth daily.    . Cholecalciferol 2000 UNITS TABS Take 2,000 Units by mouth every morning.    . cyclobenzaprine (FLEXERIL) 10 MG tablet Take 5 mg by mouth at bedtime as needed.  2  . diclofenac sodium (VOLTAREN) 1 % GEL Apply 2 g topically daily as needed (for pain).     Marland Kitchen eletriptan (RELPAX) 40 MG tablet Take 40 mg by mouth daily as needed for migraine or headache. One tablet by mouth at onset  of headache. May repeat in 2 hours    . esomeprazole (NEXIUM) 40 MG capsule Take by mouth daily with supper.     . fluconazole (DIFLUCAN) 150 MG tablet Take 150 mg by mouth daily as needed (for yeast infection).     Marland Kitchen ketoconazole (NIZORAL) 2 % cream Apply 1 application topically daily as needed for irritation.    Marland Kitchen ketoconazole (NIZORAL) 2 % shampoo Apply 1 application topically daily.     Marland Kitchen LYRICA 50 MG capsule Take 50 mg by mouth every evening.     Marland Kitchen MINIVELLE 0.1 MG/24HR patch Place 1 patch onto the skin 2 (two) times a week.     . nystatin-triamcinolone ointment (MYCOLOG) Apply 1 application topically daily as needed (to rash).     Illa Level 80 MCG/ACT AERS Place 1 spray into the nose daily.  0  . RESTASIS 0.05 % ophthalmic emulsion Place 1 drop into both eyes 2 (two) times daily.      No current facility-administered medications for  this visit.    Allergies:   Amoxil; Penicillins; Tape; Sulfa antibiotics; Zanaflex; and Polytrim    Social History:  The patient  reports that she has never smoked. She has never used smokeless tobacco. She reports that she drinks about 8.4 oz of alcohol per week. She reports that she does not use illicit drugs.   Family History:  The patient's family history includes Heart attack in her father; Hyperlipidemia in her father; Hypertension in her father and mother.    ROS:  Please see the history of present illness.   Otherwise, review of systems are positive for none.   All other systems are reviewed and negative.    PHYSICAL EXAM: VS:  BP 140/64 mmHg  Pulse 74  Ht 5' 2.75" (1.594 m)  Wt 130 lb 3.2 oz (59.058 kg)  BMI 23.24 kg/m2 , BMI Body mass index is 23.24 kg/(m^2). GEN: Well nourished, well developed, in no acute distress HEENT: normal Neck: no JVD, carotid bruits, or masses Cardiac: RRR; no murmurs, rubs, or gallops,no edema  Respiratory:  clear to auscultation bilaterally, normal work of breathing GI: soft, nontender, nondistended, + BS MS: no deformity or atrophy Skin: warm and dry, no rash Neuro:  Strength and sensation are intact Psych: euthymic mood, full affect   EKG:  EKG was ordered today showing NSR at 74bpm with T wave inversion in the precordial leads    Recent Labs: No results found for requested labs within last 365 days.    Lipid Panel No results found for: CHOL, TRIG, HDL, CHOLHDL, VLDL, LDLCALC, LDLDIRECT    Wt Readings from Last 3 Encounters:  08/28/15 130 lb 3.2 oz (59.058 kg)  01/16/15 126 lb 1.9 oz (57.208 kg)  07/28/14 120 lb (54.432 kg)    ASSESSMENT AND PLAN:  1. Noncardiac chest pain with normal coronary arteries on cath 07/2014.  She continues to have this. 2. PVC's  3. HTN - controlled 4. DOE with exercise and with palpitations. Cath showed normal coronary arteries. Her EF at cath was 60% with normal LVEDP despite low normal LVF  on echo. Cardiopulmonary stress test showed mild functional capacity decline when compared to her age group and was felt to be due to primary cardiac limitation.  She is not done 3 months of cardiac rehab and still has DOE.  I will repeat an echo and cardiopulmonary stress test as recommended by Dr. Gala Romney.  I am going to get a Cardiac MRI given her abnormal  EKG showing persistent T wave inversions in the anterior leads.   Current medicines are reviewed at length with the patient today.  The patient does not have concerns regarding medicines.  The following changes have been made:  no change  Labs/ tests ordered today: See above Assessment and Plan No orders of the defined types were placed in this encounter.     Disposition:   FU with me in 1 year  Signed, Quintella Reichert, MD  08/28/2015 2:08 PM    [facility] [address] [place], [place], [place]  [address] Phone: [phone]; Fax: [phone]

## 2015-08-28 NOTE — Patient Instructions (Addendum)
Medication Instructions:  Your physician recommends that you continue on your current medications as directed. Please refer to the Current Medication list given to you today.   Labwork: TODAY: BMET  Testing/Procedures: Your physician has recommended that you have a cardiopulmonary stress test (CPX). CPX testing is a non-invasive measurement of heart and lung function. It replaces a traditional treadmill stress test. This type of test provides a tremendous amount of information that relates not only to your present condition but also for future outcomes. This test combines measurements of you ventilation, respiratory gas exchange in the lungs, electrocardiogram (EKG), blood pressure and physical response before, during, and following an exercise protocol.  Dr. Mayford Knife recommends you have a CARDIAC MRI.  Follow-Up: Your physician wants you to follow-up in: 1 year with Dr. Mayford Knife. You will receive a reminder letter in the mail two months in advance. If you don't receive a letter, please call our office to schedule the follow-up appointment.   Any Other Special Instructions Will Be Listed Below (If Applicable).

## 2015-09-06 ENCOUNTER — Ambulatory Visit (HOSPITAL_COMMUNITY): Payer: 59

## 2015-09-07 ENCOUNTER — Encounter: Payer: Self-pay | Admitting: Cardiology

## 2015-09-22 ENCOUNTER — Telehealth (HOSPITAL_COMMUNITY): Payer: Self-pay | Admitting: Vascular Surgery

## 2015-09-22 NOTE — Telephone Encounter (Signed)
Left pt message to resch CPX

## 2015-09-28 ENCOUNTER — Ambulatory Visit (HOSPITAL_COMMUNITY)
Admission: RE | Admit: 2015-09-28 | Discharge: 2015-09-28 | Disposition: A | Discharge status: Home / Self Care | Payer: Medicare Other | Source: Ambulatory Visit | Attending: Cardiology | Admitting: Cardiology

## 2015-09-28 DIAGNOSIS — R0602 Shortness of breath: Secondary | ICD-10-CM | POA: Diagnosis not present

## 2015-09-28 DIAGNOSIS — R0789 Other chest pain: Secondary | ICD-10-CM | POA: Diagnosis not present

## 2015-09-28 DIAGNOSIS — I429 Cardiomyopathy, unspecified: Secondary | ICD-10-CM | POA: Diagnosis not present

## 2015-09-28 LAB — CREATININE, SERUM
CREATININE: 0.85 mg/dL (ref 0.44–1.00)
GFR calc Af Amer: >60 mL/min (ref >60)
GFR calc non Af Amer: >60 mL/min (ref >60)

## 2015-09-28 MED ORDER — GADOBENATE DIMEGLUMINE 529 MG/ML IV SOLN
20.0000 mL | Freq: Once | INTRAVENOUS | Status: AC | PRN
  Administered 2015-09-28: 20 mL via INTRAVENOUS

## 2015-09-29 ENCOUNTER — Encounter (HOSPITAL_COMMUNITY): Payer: 59

## 2015-10-02 ENCOUNTER — Ambulatory Visit (HOSPITAL_COMMUNITY): Payer: Medicare Other | Attending: Cardiology

## 2015-10-02 DIAGNOSIS — R0789 Other chest pain: Secondary | ICD-10-CM

## 2015-10-02 DIAGNOSIS — R0602 Shortness of breath: Secondary | ICD-10-CM | POA: Diagnosis not present

## 2015-10-02 DIAGNOSIS — R079 Chest pain, unspecified: Secondary | ICD-10-CM | POA: Diagnosis not present

## 2015-10-09 ENCOUNTER — Telehealth: Payer: Self-pay

## 2015-10-09 DIAGNOSIS — R0602 Shortness of breath: Secondary | ICD-10-CM

## 2015-10-09 NOTE — Telephone Encounter (Signed)
-----   Message from Quintella Reichert, MD sent at 10/01/2015  7:39 PM EST ----- Low normal LVF otherwise normal - refer to pulmonary for evaluation of SOB that is noncardiac

## 2015-10-09 NOTE — Telephone Encounter (Signed)
Informed patient of results and verbal understanding expressed.  Pulmonary referral placed. Patient agrees with treatment plan. 

## 2015-10-26 ENCOUNTER — Telehealth: Payer: Self-pay | Admitting: Cardiology

## 2015-10-26 ENCOUNTER — Telehealth: Payer: Self-pay

## 2015-10-26 DIAGNOSIS — R0602 Shortness of breath: Secondary | ICD-10-CM

## 2015-10-26 NOTE — Telephone Encounter (Signed)
New problem   Pt need another referral for Pulmonary because the one that is in the system was cancelled due to pt not scheduling.

## 2015-10-26 NOTE — Telephone Encounter (Signed)
-----   Message from Quintella Reichert, MD sent at 10/26/2015  4:08 PM EST ----- Please set up appt with Dr. Gala Romney in HF clinic

## 2015-10-26 NOTE — Telephone Encounter (Signed)
Patient was out of town when pulmonary attempted to schedule OV, so referral was cancelled. Patient is back and requesting new pulmonary referral placed. Referral ordered for scheduling.

## 2015-10-26 NOTE — Telephone Encounter (Signed)
Patient agrees to be seen in HF Clinic. Referral placed.

## 2015-10-31 ENCOUNTER — Telehealth (HOSPITAL_COMMUNITY): Payer: Self-pay | Admitting: *Deleted

## 2015-10-31 ENCOUNTER — Ambulatory Visit (INDEPENDENT_AMBULATORY_CARE_PROVIDER_SITE_OTHER): Payer: Medicare Other | Admitting: Emergency Medicine

## 2015-10-31 ENCOUNTER — Encounter: Payer: Self-pay | Admitting: Emergency Medicine

## 2015-10-31 ENCOUNTER — Ambulatory Visit (INDEPENDENT_AMBULATORY_CARE_PROVIDER_SITE_OTHER)
Admission: RE | Admit: 2015-10-31 | Discharge: 2015-10-31 | Disposition: A | Discharge status: Home / Self Care | Payer: Medicare Other | Source: Ambulatory Visit | Attending: Emergency Medicine | Admitting: Emergency Medicine

## 2015-10-31 VITALS — BP 140/86 | HR 78 | Ht 62.0 in | Wt 134.0 lb

## 2015-10-31 DIAGNOSIS — R0602 Shortness of breath: Secondary | ICD-10-CM | POA: Diagnosis not present

## 2015-10-31 DIAGNOSIS — R0609 Other forms of dyspnea: Secondary | ICD-10-CM

## 2015-10-31 NOTE — Patient Instructions (Signed)
We will perform full PFT to evaluate your airflows. We will perform a CXR today.  We will follow up next available after your testing to review the results together.

## 2015-10-31 NOTE — Assessment & Plan Note (Signed)
Her cardiopulmonary exercise testing has been suggestive of a possible cardiac limitation. At the same time some of her sx are suggestive of UA irritation and airflow obstruction. Her spirometry at [facility] suggested possible restriction. i believe she needs full PFT , CXR today.  Depending on PFT we will decide whether to pursue UA disease vs restriction, etc.

## 2015-10-31 NOTE — Telephone Encounter (Signed)
Left pt a message to call back and schedule an appt with Dr.McLean

## 2015-10-31 NOTE — Progress Notes (Signed)
Subjective:    Patient ID: Emily Page, female    DOB: [birth year], 64 y.o.   MRN: [id number]  HPI 64 year old woman, never smoker, with a history of fibromyalgia, raynaud's,  Allergic rhinitis, migraine headaches, hypertension. She is referred by Dr. Mayford Knife for evaluation of exertional SOB. She underwent cardiopulmonary exercise testing on 02/03/15 and again on 10/02/15. Interpretation was somewhat limited based on sub-maximal effort and difficulty with hip pain. There was some suggestion of a cardiac limitation. Spirometry on both occasions was normal, although the FEV1 to FVC ratio was elevated and could be suggestive of restriction. TTE from 07/12/14 showed diffuse hypokinesis and evidence for grade 1 diastolic dysfunction. LVEF 45-50%. RV function was normal. Can happen at rest as well as exertion. Often when walking an incline. An influence seems to be barometric pressure and weather.  She was walking 3x a week, not doing so currently. Now she feels weak, has tachycardia with exertion. Sometimes notices these episodes correlate w lower BP. Denies cough or wheeze. She has chronic allergic sx and throat discomfort, globus sensation. Clears her throat frequently.    Review of Systems  Constitutional: Negative for fever and unexpected weight change.  HENT: Positive for congestion. Negative for dental problem, ear pain, nosebleeds, postnasal drip, rhinorrhea, sinus pressure, sneezing, sore throat and trouble swallowing.   Eyes: Negative for redness and itching.  Respiratory: Positive for cough and shortness of breath. Negative for chest tightness and wheezing.   Cardiovascular: Positive for chest pain and palpitations. Negative for leg swelling.  Gastrointestinal: Negative for nausea and vomiting.       Acid Heartburn  Genitourinary: Negative for dysuria.  Musculoskeletal: Negative for joint swelling.       Hand/Feet Swelling  Skin: Negative for rash.  Neurological: Negative for headaches.    Hematological: Does not bruise/bleed easily.  Psychiatric/Behavioral: Negative for dysphoric mood. The patient is not nervous/anxious.     Past Medical History  Diagnosis Date  . Fibromyalgia     followed by Dr Bedelia Person  . Allergic rhinitis     followed by Dr Willa Rough in the past  . Migraines     followed by [facility] in past.   . GERD (gastroesophageal reflux disease)     Followed by Dr Randa Evens in the past  . 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 History of EKG     w palpitations and nonspecific T-wave changes, stress cardiolite 6/08 showed normal LV size in a systolic function,no maximum effort stress test with moderate work load and good HR achieved,no EKG criteria of ischemia. Did have occassional PVC's  . Raynaud's disease     followed by Dr Corliss Skains and norvasc has helped  . Foot pain, right     w intermittent swelling felt to be related to how she is walking on her foot. evaluated by Dr Leticia Penna (podiatrist)  . Rosacea     and seborrheic dermatitis followed by Dr Levy Sjogren  . Hypertension   . PVC (premature ventricular contraction)      Family History  Problem Relation Age of Onset  . Hypertension Mother   . Heart attack Father   . Hypertension Father   . Hyperlipidemia Father      Social History   Social History  . Marital Status: Married    Spouse Name: N/A  . Number of Children: N/A  . Years of Education: N/A   Occupational History  . Not on file.  Social History Main Topics  . Smoking status: Never Smoker   . Smokeless tobacco: Never Used     Comment: never used tobacco  . Alcohol Use: 8.4 oz/week    7 Glasses of wine, 7 Shots of liquor per week     Comment: 1-2 daily, wine  . Drug Use: No  . Sexual Activity: Not on file   Other Topics Concern  . 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   Allergies  Allergen Reactions  . Amoxil  [Amoxicillin]     Felt like throat was swelling  . Penicillins     Swelling of throat  . Tape     Electrodes cause Blisters, even with the sensitive kind   . Sulfa Antibiotics     Swelling of eyes  . Zanaflex [Tizanidine]     halluciations  . Polytrim [Polymyxin B-Trimethoprim]     unknown     Outpatient Prescriptions Prior to Visit  Medication Sig Dispense Refill  . amLODipine (NORVASC) 5 MG tablet Take 1 tablet (5 mg total) by mouth every evening. 30 tablet 5  . aspirin EC 81 MG tablet Take 81 mg by mouth every evening.    Marland Kitchen azelastine (ASTELIN) 137 MCG/SPRAY nasal spray Place 2 sprays into both nostrils daily as needed for rhinitis or allergies. Use in each nostril as directed    . cetirizine (ZYRTEC) 10 MG tablet Take 10 mg by mouth daily.    . Cholecalciferol 2000 UNITS TABS Take 2,000 Units by mouth every morning.    . cyclobenzaprine (FLEXERIL) 10 MG tablet Take 5 mg by mouth at bedtime as needed for muscle spasms.   2  . diclofenac sodium (VOLTAREN) 1 % GEL Apply 2 g topically daily as needed (for pain).     Marland Kitchen eletriptan (RELPAX) 40 MG tablet Take 40 mg by mouth daily as needed for migraine or headache. One tablet by mouth at onset of headache. May repeat in 2 hours    . esomeprazole (NEXIUM) 40 MG capsule Take by mouth daily with supper.     . fluconazole (DIFLUCAN) 150 MG tablet Take 150 mg by mouth daily as needed (for yeast infection).     Marland Kitchen ketoconazole (NIZORAL) 2 % shampoo Apply 1 application topically daily.     Marland Kitchen LYRICA 50 MG capsule Take 50 mg by mouth every evening.     . Magnesium Malate POWD Take 2 tablets by mouth daily.    Marland Kitchen MINIVELLE 0.1 MG/24HR patch Place 1 patch onto the skin 2 (two) times a week.     . nystatin-triamcinolone ointment (MYCOLOG) Apply 1 application topically daily as needed (to rash).     Illa Level 80 MCG/ACT AERS Place 1 spray into the nose daily.  0  . RESTASIS 0.05 % ophthalmic emulsion Place 1 drop into both eyes 2 (two) times daily.       Marland Kitchen UNABLE TO FIND Med Name: zyflomed herbal supplement     No facility-administered medications prior to visit.         Objective:   Physical Exam  Filed Vitals:   10/31/15 1401  BP: 140/86  Pulse: 78  Height: 5\' 2"  (1.575 m)  Weight: 134 lb (60.782 kg)  SpO2: 94%   Gen: Pleasant, well-nourished, in no distress,  normal affect  ENT: No lesions,  mouth clear,  oropharynx clear, no postnasal drip  Neck: No  JVD, some prolonged exp and UA noise, no overt stridor  Lungs: No use of accessory muscles, somewhat prolonged exp that appears to be UA in nature.   Cardiovascular: RRR, heart sounds normal, no murmur or gallops, no peripheral edema  Musculoskeletal: No deformities, no cyanosis or clubbing  Neuro: alert, non focal  Skin: Warm, no lesions or rashes      Assessment & Plan:  Exertional shortness of breath Her cardiopulmonary exercise testing has been suggestive of a possible cardiac limitation. At the same time some of her sx are suggestive of UA irritation and airflow obstruction. Her spirometry at [facility] suggested possible restriction. i believe she needs full PFT , CXR today.  Depending on PFT we will decide whether to pursue UA disease vs restriction, etc.

## 2015-11-02 ENCOUNTER — Telehealth: Payer: Self-pay | Admitting: Cardiology

## 2015-11-02 NOTE — Telephone Encounter (Signed)
New Message   Pt states that she got a call from [facility] that she needs to see a new cardiologist and she wants to know if   This is true and why

## 2015-11-02 NOTE — Telephone Encounter (Signed)
Explained to patient that she will not be switching cardiologists, but that Dr. Mayford Knife referred her to the [facility] Clinic for further evaluation of SOB. Patient grateful for clarification.

## 2015-12-08 ENCOUNTER — Encounter (HOSPITAL_COMMUNITY): Payer: Self-pay | Admitting: Internal Medicine

## 2015-12-08 ENCOUNTER — Ambulatory Visit (HOSPITAL_COMMUNITY)
Admission: RE | Admit: 2015-12-08 | Discharge: 2015-12-08 | Disposition: A | Discharge status: Home / Self Care | Payer: Medicare Other | Source: Ambulatory Visit | Attending: Internal Medicine | Admitting: Internal Medicine

## 2015-12-08 VITALS — BP 132/70 | HR 95 | Ht 62.0 in | Wt 136.8 lb

## 2015-12-08 DIAGNOSIS — R0602 Shortness of breath: Secondary | ICD-10-CM

## 2015-12-08 DIAGNOSIS — R0789 Other chest pain: Secondary | ICD-10-CM

## 2015-12-08 DIAGNOSIS — R0600 Dyspnea, unspecified: Secondary | ICD-10-CM | POA: Insufficient documentation

## 2015-12-08 DIAGNOSIS — R0609 Other forms of dyspnea: Secondary | ICD-10-CM

## 2015-12-08 DIAGNOSIS — R079 Chest pain, unspecified: Secondary | ICD-10-CM | POA: Diagnosis not present

## 2015-12-08 NOTE — Patient Instructions (Signed)
Non-Cardiac CT scanning, (CAT scanning), is a noninvasive, special x-ray that produces cross-sectional images of the body using x-rays and a computer. CT scans help physicians diagnose and treat medical conditions. For some CT exams, a contrast material is used to enhance visibility in the area of the body being studied. CT scans provide greater clarity and reveal more details than regular x-ray exams. 12/13/15 @ 9:00 am- [facility]  Your physician has recommended that you have a pulmonary function test. Pulmonary Function Tests are a group of tests that measure how well air moves in and out of your lungs. Already scheduled  Your physician has recommended that you have a sleep study. This test records several body functions during sleep, including: brain activity, eye movement, oxygen and carbon dioxide blood levels, heart rate and rhythm, breathing rate and rhythm, the flow of air through your mouth and nose, snoring, body muscle movements, and chest and belly movement. Dr.Turner [facility]  Your physician wants you to follow-up with your PCP to have your thyroid panel checked  Your physician recommends that you schedule a follow-up appointment in: 6 weeks with Dr.Bensimhon

## 2015-12-08 NOTE — Progress Notes (Signed)
Advanced [facility] Clinc Note   Date:  12/08/2015   ID:  [name], DOB [birth year], MRN [id number]  PCP:  Gaye Alken, MD    Chief Complaint  Patient presents with  . New Evaluation      History of Present Illness: Emily Page is a 65 y.o. female with a history of fibromyalgia, GERD, migraines, PVC's, noncardiac CP with normal coronary arteries on cath 07/2014 and HTN who is referred by Dr. Mayford Knife for evaluation of of ongoing dyspnea and CP without clear etiology.   She has had CP and SOB since 2015. In 2015 she had mononucleosis then her husband lost both of his parents and she lost her father. Since that time she has had an extensive work-up as below which showed normal coronaries with LVEDP 3. On echo and cMRI EF 45-50% with grade 1 DD. RV normal   She had a cardiopulmonary stress test in 3/16 and 11/6 which was submaximal effort and showed mild cardiac limitation at most. PFTs normal. No exercise induced bronchospasm. She was placed in Cardiac Rehab for 3 months but did not notice that she had any improvement in the CP.  Her SOB is mildly improved.   Recently saw Dr. Delton Coombes who was concerned for upper airway disorder or restriction. CXR was normal. Full PFTs not done yet.  Says she gets sharp chest pain about 2-3x/week. Very brief stabbing pain then goes away in a minute. Says she gets SOB intermittently with no real pattern. Prior to Christmas was walking 2.5 miles with her husband 3x/week. On some walks she would feel great but on other days could only go 1/2 mile before getting winded and light headed. Could not relate her bad days to any factor except she says it is harder to walk when air pressure is low. Occasional feet swelling, +post nasal drip. No wheezing. No orthopnea or PND. Husband says she snores lightly. She says he says she has rare apnea at night. Sleep study in 2010 with AHI 17.8 and sats down into 80s. Denies depression or anxiety.  Recent Hgb 13.9. Thyroid panel not checked recently.  Denies rashes or severe arthalgias.No history of symptoms of CTD.    Echo 8/15:  EF 45-50% Normal RV Grade 1 DD  Cardiac cath 9/15 Normal coronaries. LVEDP = 3  cMRI 11/16: 1) Normal RV size and function with no evidence of RV dysplasia 2) Mild LVE with mild diffuse hypokinesis EF 47% 3) No delayed gadolinium enhancement or evidence of scar/infiltration  CPX 11/16 FVC 2.83 (100%)    FEV1 2.13 (94%)     FEV1/FVC 75 (96%)     No exertion desat  Resting HR: 80 Peak HR: 125  (80% age predicted max HR) BP rest: 122/72 BP peak: 160/74 Peak VO2: 15.2 (67% predicted peak VO2) VE/VCO2 slope: 34.7 OUES: 1.11 Peak RER:   0.95 VE/MVV: 32.8% PETCO2 at peak: 35 O2pulse: 7  (77.8% predicted O2pulse)  Conclusion:  Interpretation of CPX data limited by submaximal effort. Based on submaximal parameters there is likely a mild cardiac limitation. When correcting for the submax effort there probably is not a significant change from her previous study in 3/16. If symptoms persist could consider CPX testing with invasive monitoring.     Past Medical History  Diagnosis Date  . Fibromyalgia     followed by Dr Bedelia Person  . Allergic rhinitis     followed  by Dr Willa Rough in the past  . Migraines     followed by [facility] in past.   . GERD (gastroesophageal reflux disease)     Followed by Dr Randa Evens in the past  . 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 History of EKG     w palpitations and nonspecific T-wave changes, stress cardiolite 6/08 showed normal LV size in a systolic function,no maximum effort stress test with moderate work load and good HR achieved,no EKG criteria of ischemia. Did have occassional PVC's  . Raynaud's disease     followed by Dr Corliss Skains and norvasc has helped  . Foot pain, right     w intermittent swelling felt to be related to how she is walking on her  foot. evaluated by Dr Leticia Penna (podiatrist)  . Rosacea     and seborrheic dermatitis followed by Dr Levy Sjogren  . Hypertension   . PVC (premature ventricular contraction)     Past Surgical History  Procedure Laterality Date  . Left heart catheterization with coronary angiogram N/A 07/28/2014    Procedure: LEFT HEART CATHETERIZATION WITH CORONARY ANGIOGRAM;  Surgeon: Marykay Lex, MD;  Location: [facility] CATH LAB;  Service: Cardiovascular;  Laterality: N/A;  . Cardiac catheterization       Current Outpatient Prescriptions  Medication Sig Dispense Refill  . amLODipine (NORVASC) 5 MG tablet Take 1 tablet (5 mg total) by mouth every evening. 30 tablet 5  . aspirin EC 81 MG tablet Take 81 mg by mouth every evening.    Marland Kitchen azelastine (ASTELIN) 137 MCG/SPRAY nasal spray Place 2 sprays into both nostrils daily as needed for rhinitis or allergies. Use in each nostril as directed    . cetirizine (ZYRTEC) 10 MG tablet Take 10 mg by mouth daily.    . Cholecalciferol 2000 UNITS TABS Take 2,000 Units by mouth every morning.    . cyclobenzaprine (FLEXERIL) 10 MG tablet Take 5 mg by mouth at bedtime as needed for muscle spasms.   2  . diclofenac sodium (VOLTAREN) 1 % GEL Apply 2 g topically daily as needed (for pain).     Marland Kitchen eletriptan (RELPAX) 40 MG tablet Take 40 mg by mouth daily as needed for migraine or headache. One tablet by mouth at onset of headache. May repeat in 2 hours    . esomeprazole (NEXIUM) 40 MG capsule Take by mouth daily with supper.     . fluconazole (DIFLUCAN) 150 MG tablet Take 150 mg by mouth daily as needed (for yeast infection).     Marland Kitchen ketoconazole (NIZORAL) 2 % shampoo Apply 1 application topically daily.     Marland Kitchen LYRICA 50 MG capsule Take 50 mg by mouth every evening.     . Magnesium Malate POWD Take 2 tablets by mouth daily.    Marland Kitchen MINIVELLE 0.1 MG/24HR patch Place 1 patch onto the skin 2 (two) times a week.     . nystatin-triamcinolone ointment (MYCOLOG) Apply 1 application topically daily  as needed (to rash).     Illa Level 80 MCG/ACT AERS Place 1 spray into the nose daily.  0  . RESTASIS 0.05 % ophthalmic emulsion Place 1 drop into both eyes 2 (two) times daily.     Marland Kitchen UNABLE TO FIND Med Name: zyflomed herbal supplement     No current facility-administered medications for this encounter.    Allergies:   Amoxil; Penicillins; Tape; Sulfa antibiotics; Zanaflex; and Polytrim    Social History:  The patient  reports that she has never smoked. She has never used smokeless tobacco. She reports that she drinks about 8.4 oz of alcohol per week. She reports that she does not use illicit drugs.   Family History:  The patient's family history includes Heart attack in her father; Hyperlipidemia in her father; Hypertension in her father and mother.    ROS:  Please see the history of present illness.   Otherwise, review of systems are positive for none.   All other systems are reviewed and negative.    PHYSICAL EXAM: VS:  BP 132/70 mmHg  Pulse 95  Ht 5\' 2"  (1.575 m)  Wt 136 lb 12.8 oz (62.052 kg)  BMI 25.01 kg/m2  SpO2 98% , BMI Body mass index is 25.01 kg/(m^2). GEN: Well nourished, well developed, in no acute distress HEENT: normal Neck: no JVD, carotid bruits, or masses Cardiac: RRR; no murmurs, rubs, or gallops,no edema  Respiratory:  clear to auscultation bilaterally, normal work of breathing GI: soft, nontender, nondistended, + BS MS: no deformity or atrophy Skin: warm and dry, no rash Neuro:  Strength and sensation are intact Psych: euthymic mood, full affect   Recent Labs: 09/28/2015: Creatinine, Ser 0.85    Lipid Panel No results found for: CHOL, TRIG, HDL, CHOLHDL, VLDL, LDLCALC, LDLDIRECT    Wt Readings from Last 3 Encounters:  12/08/15 136 lb 12.8 oz (62.052 kg)  10/31/15 134 lb (60.782 kg)  08/28/15 130 lb 3.2 oz (59.058 kg)    ASSESSMENT AND PLAN:  1. Dyspnea and chest pain.     --Her CP is atypical and clearly non-cardiac. I reassured her of this.     --Her dyspnea is a difficult situation. It clearly waxes and wanes with no clear pattern. I have reviewed all her studies personally and discussed them with her individually. I also candidly raised the question if she thought some of her symptoms could be related to depression or anxiety given all she has gone through the last year and she did not feel that this was the case. Her CPX test is submaximal but relatively normal except for a mildly elevated slope which could signify some increase in pulmonary pressures with exercise but no other indications of a cardiac limitation and she did not desaturate thus my concern here for something really bad is low. I called Dr. Delton Coombes to discuss and he raised the possibility of restriction or vocal cord dysfunction but we both feel these are less likely. She has full PFTs pending to further evaluate.  I also discussed with her the option of an exercise RHC to evaluate for exercise-induced PAH but she would like to defer this for now. Finally, she does have symptoms of OSA and sleep study in 2010 showed moderate OSA.  Plan for now will be:  1) Await PFTs 2) Schedule repeat sleep study with Dr. Mayford Knife 3) Check CT scan of chest with contrast to evaluate for PE or other chest pathology 4) Check thyroid profile with PCP 5) Place ReDS Vesst in clinic to measure lung water 6) See me back in 1 month. If no improvement or other answer consider exercise RHC   Total time spent >50 minutes. Over half that time spent discussing above.   Mary-Anne Polizzi,MD 10:32 PM

## 2015-12-11 DIAGNOSIS — Z6824 Body mass index (BMI) 24.0-24.9, adult: Secondary | ICD-10-CM | POA: Diagnosis not present

## 2015-12-11 DIAGNOSIS — Z01419 Encounter for gynecological examination (general) (routine) without abnormal findings: Secondary | ICD-10-CM | POA: Diagnosis not present

## 2015-12-11 DIAGNOSIS — Z1272 Encounter for screening for malignant neoplasm of vagina: Secondary | ICD-10-CM | POA: Diagnosis not present

## 2015-12-11 DIAGNOSIS — Z124 Encounter for screening for malignant neoplasm of cervix: Secondary | ICD-10-CM | POA: Diagnosis not present

## 2015-12-11 DIAGNOSIS — Z1231 Encounter for screening mammogram for malignant neoplasm of breast: Secondary | ICD-10-CM | POA: Diagnosis not present

## 2015-12-12 NOTE — Addendum Note (Signed)
Encounter addended by: Noralee Space, RN on: 12/12/2015 11:16 AM<BR>     Documentation filed: [employer], Orders

## 2015-12-13 ENCOUNTER — Ambulatory Visit (HOSPITAL_COMMUNITY)
Admission: RE | Admit: 2015-12-13 | Discharge: 2015-12-13 | Disposition: A | Discharge status: Home / Self Care | Payer: Medicare Other | Source: Ambulatory Visit | Attending: Internal Medicine | Admitting: Internal Medicine

## 2015-12-13 ENCOUNTER — Encounter (HOSPITAL_COMMUNITY): Payer: Self-pay

## 2015-12-13 ENCOUNTER — Encounter (INDEPENDENT_AMBULATORY_CARE_PROVIDER_SITE_OTHER): Payer: Self-pay

## 2015-12-13 DIAGNOSIS — I10 Essential (primary) hypertension: Secondary | ICD-10-CM | POA: Diagnosis not present

## 2015-12-13 DIAGNOSIS — R079 Chest pain, unspecified: Secondary | ICD-10-CM | POA: Diagnosis not present

## 2015-12-13 DIAGNOSIS — R0602 Shortness of breath: Secondary | ICD-10-CM | POA: Diagnosis not present

## 2015-12-13 LAB — BASIC METABOLIC PANEL
ANION GAP: 8 (ref 5–15)
BUN: 13 mg/dL (ref 6–20)
CALCIUM: 9 mg/dL (ref 8.9–10.3)
CO2: 29 mmol/L (ref 22–32)
Chloride: 102 mmol/L (ref 101–111)
Creatinine, Ser: 0.77 mg/dL (ref 0.44–1.00)
GFR calc Af Amer: >60 mL/min (ref >60)
GFR calc non Af Amer: >60 mL/min (ref >60)
Glucose, Bld: 103 mg/dL — ABNORMAL HIGH (ref 65–99)
Potassium: 3.8 mmol/L (ref 3.5–5.1)
Sodium: 139 mmol/L (ref 135–145)

## 2015-12-13 MED ORDER — IOHEXOL 300 MG/ML  SOLN
100.0000 mL | Freq: Once | INTRAMUSCULAR | Status: AC | PRN
  Administered 2015-12-13: 100 mL via INTRAVENOUS

## 2016-01-04 DIAGNOSIS — K219 Gastro-esophageal reflux disease without esophagitis: Secondary | ICD-10-CM | POA: Diagnosis not present

## 2016-01-04 DIAGNOSIS — M797 Fibromyalgia: Secondary | ICD-10-CM | POA: Diagnosis not present

## 2016-01-04 DIAGNOSIS — I493 Ventricular premature depolarization: Secondary | ICD-10-CM | POA: Diagnosis not present

## 2016-01-04 DIAGNOSIS — J309 Allergic rhinitis, unspecified: Secondary | ICD-10-CM | POA: Diagnosis not present

## 2016-01-04 DIAGNOSIS — G43909 Migraine, unspecified, not intractable, without status migrainosus: Secondary | ICD-10-CM | POA: Diagnosis not present

## 2016-01-04 DIAGNOSIS — E559 Vitamin D deficiency, unspecified: Secondary | ICD-10-CM | POA: Diagnosis not present

## 2016-01-04 DIAGNOSIS — Z0000 Encounter for general adult medical examination without abnormal findings: Secondary | ICD-10-CM | POA: Diagnosis not present

## 2016-01-04 DIAGNOSIS — Z1389 Encounter for screening for other disorder: Secondary | ICD-10-CM | POA: Diagnosis not present

## 2016-01-04 DIAGNOSIS — I10 Essential (primary) hypertension: Secondary | ICD-10-CM | POA: Diagnosis not present

## 2016-01-04 DIAGNOSIS — Z23 Encounter for immunization: Secondary | ICD-10-CM | POA: Diagnosis not present

## 2016-01-04 DIAGNOSIS — Z1211 Encounter for screening for malignant neoplasm of colon: Secondary | ICD-10-CM | POA: Diagnosis not present

## 2016-01-04 DIAGNOSIS — R0981 Nasal congestion: Secondary | ICD-10-CM | POA: Diagnosis not present

## 2016-01-05 ENCOUNTER — Ambulatory Visit (INDEPENDENT_AMBULATORY_CARE_PROVIDER_SITE_OTHER): Payer: Medicare Other | Admitting: Emergency Medicine

## 2016-01-05 ENCOUNTER — Encounter: Payer: Self-pay | Admitting: Emergency Medicine

## 2016-01-05 VITALS — BP 128/78 | HR 71 | Ht 62.0 in | Wt 138.2 lb

## 2016-01-05 DIAGNOSIS — R0609 Other forms of dyspnea: Secondary | ICD-10-CM

## 2016-01-05 DIAGNOSIS — R0602 Shortness of breath: Secondary | ICD-10-CM | POA: Diagnosis not present

## 2016-01-05 LAB — PULMONARY FUNCTION TEST
DL/VA % PRED: 86 %
DL/VA: 4.04 ml/min/mmHg/L
DLCO COR: 16.32 ml/min/mmHg
DLCO UNC % PRED: 72 %
DLCO UNC: 16.57 ml/min/mmHg
DLCO cor % pred: 71 %
FEF 25-75 PRE: 1.77 L/s
FEF 25-75 Post: 2.38 L/sec
FEF2575-%CHANGE-POST: 34 %
FEF2575-%PRED-PRE: 85 %
FEF2575-%Pred-Post: 115 %
FEV1-%Change-Post: 5 %
FEV1-%PRED-POST: 100 %
FEV1-%PRED-PRE: 94 %
FEV1-POST: 2.33 L
FEV1-Pre: 2.2 L
FEV1FVC-%CHANGE-POST: 6 %
FEV1FVC-%Pred-Pre: 98 %
FEV6-%CHANGE-POST: 3 %
FEV6-%PRED-PRE: 95 %
FEV6-%Pred-Post: 98 %
FEV6-Post: 2.88 L
FEV6-Pre: 2.79 L
FEV6FVC-%CHANGE-POST: 3 %
FEV6FVC-%Pred-Post: 103 %
FEV6FVC-%Pred-Pre: 100 %
FVC-%Change-Post: 0 %
FVC-%Pred-Post: 95 %
FVC-%Pred-Pre: 95 %
FVC-Post: 2.89 L
FVC-Pre: 2.9 L
POST FEV6/FVC RATIO: 100 %
PRE FEV1/FVC RATIO: 76 %
Post FEV1/FVC ratio: 81 %
Pre FEV6/FVC Ratio: 96 %
RV % pred: 85 %
RV: 1.75 L
TLC % PRED: 92 %
TLC: 4.53 L

## 2016-01-05 NOTE — Patient Instructions (Signed)
Your pulmonary function testing and your CT scan of the chest are both reassuring and do not explain your weakness and shortness of breath  You should complete your sleep study as planned. If we confirm sleep apnea then we should strongly consider treatment.  Follow with Dr Gala Romney as planned.

## 2016-01-05 NOTE — Progress Notes (Signed)
Subjective:    Patient ID: Emily Page, female    DOB: 03-25-1951, 65 y.o.   MRN: [id number]  HPI 65 year old woman, never smoker, with a history of fibromyalgia, raynaud's,  Allergic rhinitis, migraine headaches, hypertension. She is referred by Dr. Mayford Knife for evaluation of exertional SOB. She underwent cardiopulmonary exercise testing on 02/03/15 and again on 10/02/15. Interpretation was somewhat limited based on sub-maximal effort and difficulty with hip pain. There was some suggestion of a cardiac limitation. Spirometry on both occasions was normal, although the FEV1 to FVC ratio was elevated and could be suggestive of restriction. TTE from 07/12/14 showed diffuse hypokinesis and evidence for grade 1 diastolic dysfunction. LVEF 45-50%. RV function was normal. Can happen at rest as well as exertion. Often when walking an incline. An influence seems to be barometric pressure and weather.  She was walking 3x a week, not doing so currently. Now she feels weak, has tachycardia with exertion. Sometimes notices these episodes correlate w lower BP. Denies cough or wheeze. She has chronic allergic sx and throat discomfort, globus sensation. Clears her throat frequently.   ROV 01/05/16 -- Overall her breathing problems remain unchanged. Her exertional SOB is intermittent. She also gets generalized weakness and palpitations with these episodes. She feels air hunger and gets tachypneic. She has not identified any obvious triggers. No anxiety associated. Presently does not have dyspnea or wheezing. She has chronic sinus congestion. She does not use CPAP.  PFTs 01/05/2016 FEV1/FVC 76%, FEV1 2.2L 94%, FVC 95%, TLC 92%, RV 85%, DLCOcor 71%, DL/VA 40%.  Review of Systems  Constitutional: Negative for fever and unexpected weight change.  HENT: Positive for congestion. Negative for dental problem, ear pain, nosebleeds, postnasal drip, rhinorrhea, sinus pressure, sneezing, sore throat and trouble swallowing.     Eyes: Negative for redness and itching.  Respiratory: Positive for cough and shortness of breath. Negative for chest tightness and wheezing.   Cardiovascular: Positive for chest pain and palpitations. Negative for leg swelling.  Gastrointestinal: Negative for nausea and vomiting.       Acid Heartburn  Genitourinary: Negative for dysuria.  Musculoskeletal: Negative for joint swelling.       Hand/Feet Swelling  Skin: Negative for rash.  Neurological: Negative for headaches.  Hematological: Does not bruise/bleed easily.  Psychiatric/Behavioral: Negative for dysphoric mood. The patient is not nervous/anxious.     Past Medical History  Diagnosis Date  . Fibromyalgia     followed by Dr Bedelia Person  . Allergic rhinitis     followed by Dr Willa Rough in the past  . Migraines     followed by [facility] in past.   . GERD (gastroesophageal reflux disease)     Followed by Dr Randa Evens in the past  . 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 History of EKG     w palpitations and nonspecific T-wave changes, stress cardiolite 6/08 showed normal LV size in a systolic function,no maximum effort stress test with moderate work load and good HR achieved,no EKG criteria of ischemia. Did have occassional PVC's  . Raynaud's disease     followed by Dr Corliss Skains and norvasc has helped  . Foot pain, right     w intermittent swelling felt to be related to how she is walking on her foot. evaluated by Dr Leticia Penna (podiatrist)  . Rosacea     and seborrheic dermatitis followed by Dr Levy Sjogren  . Hypertension   . PVC (premature ventricular contraction)  Family History  Problem Relation Age of Onset  . Hypertension Mother   . Heart attack Father   . Hypertension Father   . Hyperlipidemia Father      Social History   Social History  . Marital Status: Married    Spouse Name: N/A  . Number of Children: N/A  . Years of Education: N/A   Occupational History  . Not on  file.   Social History Main Topics  . Smoking status: Never Smoker   . Smokeless tobacco: Never Used     Comment: never used tobacco  . Alcohol Use: 8.4 oz/week    7 Glasses of wine, 7 Shots of liquor per week     Comment: 1-2 daily, wine  . Drug Use: No  . Sexual Activity: Not on file   Other Topics Concern  . 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   Allergies  Allergen Reactions  . Amoxil [Amoxicillin]     Felt like throat was swelling  . Penicillins     Swelling of throat  . Tape     Electrodes cause Blisters, even with the sensitive kind   . Sulfa Antibiotics     Swelling of eyes  . Zanaflex [Tizanidine]     halluciations  . Polytrim [Polymyxin B-Trimethoprim]     unknown     Outpatient Prescriptions Prior to Visit  Medication Sig Dispense Refill  . amLODipine (NORVASC) 5 MG tablet Take 1 tablet (5 mg total) by mouth every evening. 30 tablet 5  . aspirin EC 81 MG tablet Take 81 mg by mouth every evening.    Marland Kitchen azelastine (ASTELIN) 137 MCG/SPRAY nasal spray Place 2 sprays into both nostrils daily as needed for rhinitis or allergies. Use in each nostril as directed    . cetirizine (ZYRTEC) 10 MG tablet Take 10 mg by mouth daily.    . Cholecalciferol 2000 UNITS TABS Take 2,000 Units by mouth every morning.    . cyclobenzaprine (FLEXERIL) 10 MG tablet Take 2.5 mg by mouth at bedtime as needed for muscle spasms.   2  . diclofenac sodium (VOLTAREN) 1 % GEL Apply 2 g topically daily as needed (for pain).     Marland Kitchen eletriptan (RELPAX) 40 MG tablet Take 40 mg by mouth daily as needed for migraine or headache. One tablet by mouth at onset of headache. May repeat in 2 hours    . esomeprazole (NEXIUM) 40 MG capsule Take by mouth daily with supper.     . fluconazole (DIFLUCAN) 150 MG tablet Take 150 mg by mouth daily as needed (for yeast infection).     Marland Kitchen ketoconazole (NIZORAL) 2 % shampoo Apply 1 application  topically daily.     . Magnesium Malate POWD Take 2 tablets by mouth daily.    Marland Kitchen nystatin-triamcinolone ointment (MYCOLOG) Apply 1 application topically daily as needed (to rash).     Illa Level 80 MCG/ACT AERS Place 1 spray into the nose daily.  0  . RESTASIS 0.05 % ophthalmic emulsion Place 1 drop into both eyes 2 (two) times daily.     Marland Kitchen UNABLE TO FIND Med Name: zyflomed herbal supplement    . LYRICA 50 MG capsule Take 50 mg by mouth every evening.     Marland Kitchen MINIVELLE 0.1 MG/24HR patch Place 1 patch onto the skin 2 (two) times a week.      No facility-administered  medications prior to visit.   Objective:   Physical Exam  Filed Vitals:   01/05/16 1605  BP: 128/78  Pulse: 71  Height: 5\' 2"  (1.575 m)  Weight: 138 lb 3.2 oz (62.687 kg)  SpO2: 98%   Gen: Pleasant, well-nourished, in no distress,  normal affect  ENT: No lesions,  mouth clear,  oropharynx clear, no postnasal drip  Neck: No JVD, some prolonged exp, no overt stridor  Lungs: No use of accessory muscles, somewhat prolonged exp, no wheezing or rales  Cardiovascular: RRR, heart sounds normal, no murmur or gallops, no peripheral edema  Musculoskeletal: No deformities, no cyanosis or clubbing  Neuro: alert, non focal  Skin: Warm, no lesions or rashes   CT chest with contrast 12/13/2015 personally reviewed. Largely unremarkable. No obvious abnormalities. Assessment & Plan:  Exertional shortness of breath PFTs done today were unremarkable. No evidence of obstruction or restrictive lung disease. No diffusion defect. CT chest on 1/18 also unremarkable with no abnormality to explain her symptoms. She was seen by Dr. Gala Romney on 12/08/2015 where he discussed with her the option of exerecise RHC to evaluate exercise-induced PAH.   Agree with proceeding with sleep study to evaluate OSA She will check thyroid profile with PCP Follow up with Dr. Gala Romney and Dr. Mayford Knife as previously planned Follow up with Dr. Delton Coombes as  needed  Griffin Basil, MD  Internal Medicine PGY-2 01/05/2016 5:09 PM   Attending Note:  I have examined patient, reviewed labs, studies and notes. I have discussed the case with Dr Isabella Bowens, and I agree with the data and plans as amended above. The patient follows up today with full pulmonary function testing. She has also had a CT scan of the chest which I have reviewed. Her shortness of breath is difficult to gauge and can sometimes be associated with feeling more consistent with just fatigue. Her CBC and TSH were unremarkable. Her pulmonary function testing today shows normal spirometry, normal lung volumes, normal corrected diffusing capacity. Her CT scan of the chest does not show any interstitial disease or pulmonary embolism. I agree that she needs to undergo a repeat sleep study and this has been arranged. He may be a candidate for right heart catheterization although I believe exercise-induced pulmonary hypertension is unlikely in the absence of desaturation or any other objective findings. I will be happy to follow her for sleep apnea at this is diagnosed or to help with any other diagnostic testing that we decide as indicated. I asked her to follow with Dr Gala Romney for now as he has coordinated the evaluation  Levy Pupa, MD, PhD 01/05/2016, 5:09 PM [facility] [phone] or if no answer [phone]

## 2016-01-05 NOTE — Assessment & Plan Note (Addendum)
PFTs done today were unremarkable. No evidence of obstruction or restrictive lung disease. No diffusion defect. CT chest on 1/18 also unremarkable with no abnormality to explain her symptoms. She was seen by Dr. Gala Romney on 12/08/2015 where he discussed with her the option of exerecise RHC to evaluate exercise-induced PAH.   Agree with proceeding with sleep study to evaluate OSA She will check thyroid profile with PCP Follow up with Dr. Gala Romney and Dr. Mayford Knife as previously planned Follow up with Dr. Delton Coombes as needed

## 2016-01-05 NOTE — Progress Notes (Signed)
PFT done today. 

## 2016-01-08 DIAGNOSIS — H2513 Age-related nuclear cataract, bilateral: Secondary | ICD-10-CM | POA: Diagnosis not present

## 2016-01-17 DIAGNOSIS — I10 Essential (primary) hypertension: Secondary | ICD-10-CM | POA: Diagnosis not present

## 2016-01-17 DIAGNOSIS — G43909 Migraine, unspecified, not intractable, without status migrainosus: Secondary | ICD-10-CM | POA: Diagnosis not present

## 2016-01-17 DIAGNOSIS — E559 Vitamin D deficiency, unspecified: Secondary | ICD-10-CM | POA: Diagnosis not present

## 2016-01-17 DIAGNOSIS — I493 Ventricular premature depolarization: Secondary | ICD-10-CM | POA: Diagnosis not present

## 2016-01-17 DIAGNOSIS — M797 Fibromyalgia: Secondary | ICD-10-CM | POA: Diagnosis not present

## 2016-01-17 DIAGNOSIS — R5383 Other fatigue: Secondary | ICD-10-CM | POA: Diagnosis not present

## 2016-01-17 DIAGNOSIS — Z0000 Encounter for general adult medical examination without abnormal findings: Secondary | ICD-10-CM | POA: Diagnosis not present

## 2016-01-17 DIAGNOSIS — Z136 Encounter for screening for cardiovascular disorders: Secondary | ICD-10-CM | POA: Diagnosis not present

## 2016-01-19 ENCOUNTER — Ambulatory Visit (HOSPITAL_COMMUNITY)
Admission: RE | Admit: 2016-01-19 | Discharge: 2016-01-19 | Disposition: A | Discharge status: Home / Self Care | Payer: Medicare Other | Source: Ambulatory Visit | Attending: Internal Medicine | Admitting: Internal Medicine

## 2016-01-19 VITALS — BP 90/52 | HR 64 | Wt 134.5 lb

## 2016-01-19 DIAGNOSIS — Z888 Allergy status to other drugs, medicaments and biological substances status: Secondary | ICD-10-CM | POA: Insufficient documentation

## 2016-01-19 DIAGNOSIS — I7300 Raynaud's syndrome without gangrene: Secondary | ICD-10-CM | POA: Insufficient documentation

## 2016-01-19 DIAGNOSIS — R002 Palpitations: Secondary | ICD-10-CM | POA: Insufficient documentation

## 2016-01-19 DIAGNOSIS — K219 Gastro-esophageal reflux disease without esophagitis: Secondary | ICD-10-CM | POA: Insufficient documentation

## 2016-01-19 DIAGNOSIS — I10 Essential (primary) hypertension: Secondary | ICD-10-CM | POA: Insufficient documentation

## 2016-01-19 DIAGNOSIS — M797 Fibromyalgia: Secondary | ICD-10-CM | POA: Diagnosis not present

## 2016-01-19 DIAGNOSIS — Z882 Allergy status to sulfonamides status: Secondary | ICD-10-CM | POA: Diagnosis not present

## 2016-01-19 DIAGNOSIS — Z880 Allergy status to penicillin: Secondary | ICD-10-CM | POA: Diagnosis not present

## 2016-01-19 DIAGNOSIS — R0602 Shortness of breath: Secondary | ICD-10-CM

## 2016-01-19 DIAGNOSIS — Z79899 Other long term (current) drug therapy: Secondary | ICD-10-CM | POA: Diagnosis not present

## 2016-01-19 DIAGNOSIS — Z7982 Long term (current) use of aspirin: Secondary | ICD-10-CM | POA: Insufficient documentation

## 2016-01-19 DIAGNOSIS — Z8249 Family history of ischemic heart disease and other diseases of the circulatory system: Secondary | ICD-10-CM | POA: Diagnosis not present

## 2016-01-19 DIAGNOSIS — R0600 Dyspnea, unspecified: Secondary | ICD-10-CM | POA: Insufficient documentation

## 2016-01-19 NOTE — Addendum Note (Signed)
Encounter addended by: Noralee Space, RN on: 01/19/2016 11:31 AM<BR>     Documentation filed: Dx Association, Patient Instructions Section, Orders

## 2016-01-19 NOTE — Progress Notes (Signed)
Advanced Heart Failure Medication Review by a Pharmacist  Does the patient  feel that his/her medications are working for him/her?  yes  Has the patient been experiencing any side effects to the medications prescribed?  no  Does the patient measure his/her own blood pressure or blood glucose at home?  no   Does the patient have any problems obtaining medications due to transportation or finances?   no  Understanding of regimen: good Understanding of indications: good Potential of compliance: good Patient understands to avoid NSAIDs. Patient understands to avoid decongestants.  Issues to address at subsequent visits: None   Pharmacist comments:  Emily Page is a pleasant 65 yo F presenting with her husband but without a medication list. She reports good compliance with her regimen and did not have any specific medication-related questions or concerns for me at this time.   Tyler Deis. Bonnye Fava, PharmD, BCPS, CPP Clinical Pharmacist Pager: [phone] Phone: [phone] 01/19/2016 10:56 AM      Time with patient: 8 minutes Preparation and documentation time: 2 minutes Total time: 10 minutes

## 2016-01-19 NOTE — Patient Instructions (Signed)
Your physician has recommended that you wear an event monitor. Event monitors are medical devices that record the heart's electrical activity. Doctors most often us these monitors to diagnose arrhythmias. Arrhythmias are problems with the speed or rhythm of the heartbeat. The monitor is a small, portable device. You can wear one while you do your normal daily activities. This is usually used to diagnose what is causing palpitations/syncope (passing out).  Your physician recommends that you schedule a follow-up appointment in: 2 months  

## 2016-01-19 NOTE — Progress Notes (Signed)
Patient ID: Emily Page, female   DOB: 15-Nov-1951, 65 y.o.   MRN: [id number]                 Advanced HF Clinc Note   Date:  01/19/2016   ID:  Emily Page, DOB [birth year], MRN [id number]  PCP:  Gaye Alken, MD    No chief complaint on file.     History of Present Illness: Emily Page is a 65 y.o. female with a history of fibromyalgia, GERD, migraines, PVC's, noncardiac CP with normal coronary arteries on cath 07/2014 and HTN who is referred by Dr. Mayford Knife for evaluation of of ongoing dyspnea and CP without clear etiology.   She has had CP and SOB since 2015. In 2015 she had mononucleosis then her husband lost both of his parents and she lost her father. Since that time she has had an extensive work-up as below which showed normal coronaries with LVEDP 3. On echo and cMRI EF 45-50% with grade 1 DD. RV normal   She had a cardiopulmonary stress test in 3/16 and 11/6 which was submaximal effort and showed mild cardiac limitation at most. PFTs normal. No exercise induced bronchospasm. She was placed in Cardiac Rehab for 3 months but did not notice that she had any improvement in the CP.  Her SOB is mildly improved.   Recently saw Dr. Delton Coombes who was concerned for upper airway disorder or restriction. CXR was normal. Full PFTs were normal.   I saw her last month and ordered chest CT which was normal. Sleep study pending for next month.   Here for f/u. Overall feeling ok. Walks 2 miles three times per week. Says she gets episodes 1-2x/week where she has tachypalpitations and gets weak and presyncopal. At other times she feels ok. On a recent walk had her heart rate shoot up to 131 and then came back to down in 90s. Husband says he can see when she is having an episode. Nephew recently diagnosed with POTS. No flushing, HAs, diarrhea or ab pain.    Denies rashes or severe arthalgias.No history of symptoms of CTD.    Echo 8/15:  EF 45-50% Normal RV Grade 1 DD  Cardiac  cath 9/15 Normal coronaries. LVEDP = 3  cMRI 11/16: 1) Normal RV size and function with no evidence of RV dysplasia 2) Mild LVE with mild diffuse hypokinesis EF 47% 3) No delayed gadolinium enhancement or evidence of scar/infiltration  CPX 11/16 FVC 2.83 (100%)    FEV1 2.13 (94%)     FEV1/FVC 75 (96%)     No exertion desat  Resting HR: 80 Peak HR: 125  (80% age predicted max HR) BP rest: 122/72 BP peak: 160/74 Peak VO2: 15.2 (67% predicted peak VO2) VE/VCO2 slope: 34.7 OUES: 1.11 Peak RER:   0.95 VE/MVV: 32.8% PETCO2 at peak: 35 O2pulse: 7  (77.8% predicted O2pulse)  Conclusion:  Interpretation of CPX data limited by submaximal effort. Based on submaximal parameters there is likely a mild cardiac limitation. When correcting for the submax effort there probably is not a significant change from her previous study in 3/16. If symptoms persist could consider CPX testing with invasive monitoring.     Past Medical History  Diagnosis Date  . Fibromyalgia     followed by Dr Bedelia Person  . Allergic rhinitis     followed by Dr Willa Rough in the past  . Migraines     followed by [facility] in past.   . GERD (  gastroesophageal reflux disease)     Followed by Dr Randa Evens in the past  . 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 History of EKG     w palpitations and nonspecific T-wave changes, stress cardiolite 6/08 showed normal LV size in a systolic function,no maximum effort stress test with moderate work load and good HR achieved,no EKG criteria of ischemia. Did have occassional PVC's  . Raynaud's disease     followed by Dr Corliss Skains and norvasc has helped  . Foot pain, right     w intermittent swelling felt to be related to how she is walking on her foot. evaluated by Dr Leticia Penna (podiatrist)  . Rosacea     and seborrheic dermatitis followed by Dr Levy Sjogren  . Hypertension   . PVC (premature ventricular contraction)     Past  Surgical History  Procedure Laterality Date  . Left heart catheterization with coronary angiogram N/A 07/28/2014    Procedure: LEFT HEART CATHETERIZATION WITH CORONARY ANGIOGRAM;  Surgeon: Marykay Lex, MD;  Location: [facility] CATH LAB;  Service: Cardiovascular;  Laterality: N/A;  . Cardiac catheterization       Current Outpatient Prescriptions  Medication Sig Dispense Refill  . amLODipine (NORVASC) 5 MG tablet Take 1 tablet (5 mg total) by mouth every evening. 30 tablet 5  . aspirin EC 81 MG tablet Take 81 mg by mouth every evening.    Marland Kitchen azelastine (ASTELIN) 137 MCG/SPRAY nasal spray Place 2 sprays into both nostrils daily as needed for rhinitis or allergies. Use in each nostril as directed    . cetirizine (ZYRTEC) 10 MG tablet Take 10 mg by mouth daily.    . Cholecalciferol 2000 UNITS TABS Take 2,000 Units by mouth every morning.    . cyclobenzaprine (FLEXERIL) 10 MG tablet Take 2.5 mg by mouth at bedtime.   2  . diclofenac sodium (VOLTAREN) 1 % GEL Apply 2 g topically daily as needed (for pain).     Marland Kitchen esomeprazole (NEXIUM) 40 MG capsule Take by mouth daily with supper.     Marland Kitchen estradiol (ESTRACE) 2 MG tablet Take 2 mg by mouth daily.    . fluconazole (DIFLUCAN) 150 MG tablet Take 150 mg by mouth daily as needed (for yeast infection).     . fluticasone (FLONASE) 50 MCG/ACT nasal spray Place 1 spray into both nostrils daily.    Marland Kitchen ketoconazole (NIZORAL) 2 % shampoo Apply 1 application topically daily.     Marland Kitchen LYRICA 75 MG capsule Take 1 tablet by mouth daily.  2  . Magnesium Malate POWD Take 2 tablets by mouth daily.    Marland Kitchen nystatin-triamcinolone ointment (MYCOLOG) Apply 1 application topically daily as needed (to rash).     . RESTASIS 0.05 % ophthalmic emulsion Place 1 drop into both eyes 2 (two) times daily.     Marland Kitchen eletriptan (RELPAX) 40 MG tablet Take 40 mg by mouth daily as needed for migraine or headache. Reported on 01/19/2016     No current facility-administered medications for this encounter.      Allergies:   Amoxil; Penicillins; Tape; Sulfa antibiotics; Zanaflex; and Polytrim    Social History:  The patient  reports that she has never smoked. She has never used smokeless tobacco. She reports that she drinks about 8.4 oz of alcohol per week. She reports that she does not use illicit drugs.   Family History:  The patient's family history includes Heart attack in her father; Hyperlipidemia in her father;  Hypertension in her father and mother.    ROS:  Please see the history of present illness.   Otherwise, review of systems are positive for none.   All other systems are reviewed and negative.    PHYSICAL EXAM: VS:  BP 90/52 mmHg  Pulse 64  Wt 134 lb 8 oz (61.009 kg)  SpO2 98% , BMI Body mass index is 24.59 kg/(m^2). GEN: Well nourished, well developed, in no acute distress HEENT: normal Neck: no JVD, carotid bruits, or masses Cardiac: RRR; no murmurs, rubs, or gallops,no edema  Respiratory:  clear to auscultation bilaterally, normal work of breathing GI: soft, nontender, nondistended, + BS MS: no deformity or atrophy Skin: warm and dry, no rash Neuro:  Strength and sensation are intact Psych: euthymic mood, full affect   Recent Labs: 12/13/2015: BUN 13; Creatinine, Ser 0.77; Potassium 3.8; Sodium 139    Lipid Panel No results found for: CHOL, TRIG, HDL, CHOLHDL, VLDL, LDLCALC, LDLDIRECT    Wt Readings from Last 3 Encounters:  01/19/16 134 lb 8 oz (61.009 kg)  01/05/16 138 lb 3.2 oz (62.687 kg)  12/08/15 136 lb 12.8 oz (62.052 kg)    ASSESSMENT AND PLAN:  1. Dyspnea and exercise intolerance 2.  Tachypalpitations   Work-up so far looks good and I reassured her and her husband with results of testing to date. History today more consistent with episodic nature of dyspnea and weakness/presyncope. I am wondering whether this could be a tachyarrhythmia. Given Fhx, POTS also a possibility but less likely. Differential also includes vasovagal episodes, pheo or  serotonin syndrome (carcinoid).   Will refer for 30-day event monitor and proceed from there.  Bryne Lindon,MD 10:55 AM

## 2016-01-23 ENCOUNTER — Ambulatory Visit (INDEPENDENT_AMBULATORY_CARE_PROVIDER_SITE_OTHER): Payer: Medicare Other

## 2016-01-23 ENCOUNTER — Encounter: Payer: Self-pay | Admitting: Cardiology

## 2016-01-23 DIAGNOSIS — R002 Palpitations: Secondary | ICD-10-CM

## 2016-01-30 ENCOUNTER — Ambulatory Visit (HOSPITAL_BASED_OUTPATIENT_CLINIC_OR_DEPARTMENT_OTHER): Payer: Medicare Other

## 2016-02-12 DIAGNOSIS — R5381 Other malaise: Secondary | ICD-10-CM | POA: Diagnosis not present

## 2016-02-12 DIAGNOSIS — G4709 Other insomnia: Secondary | ICD-10-CM | POA: Diagnosis not present

## 2016-02-12 DIAGNOSIS — M797 Fibromyalgia: Secondary | ICD-10-CM | POA: Diagnosis not present

## 2016-02-22 ENCOUNTER — Other Ambulatory Visit: Payer: Self-pay | Admitting: Cardiology

## 2016-03-18 ENCOUNTER — Telehealth (HOSPITAL_COMMUNITY): Payer: Self-pay | Admitting: Vascular Surgery

## 2016-03-18 NOTE — Telephone Encounter (Signed)
Pt canceled appt through automated service 03/20/16 left pt message to reschedule

## 2016-03-20 ENCOUNTER — Encounter (HOSPITAL_COMMUNITY): Payer: Medicare Other | Admitting: Internal Medicine

## 2016-04-01 DIAGNOSIS — K219 Gastro-esophageal reflux disease without esophagitis: Secondary | ICD-10-CM | POA: Diagnosis not present

## 2016-04-01 DIAGNOSIS — Z1211 Encounter for screening for malignant neoplasm of colon: Secondary | ICD-10-CM | POA: Diagnosis not present

## 2016-04-01 DIAGNOSIS — R1310 Dysphagia, unspecified: Secondary | ICD-10-CM | POA: Diagnosis not present

## 2016-04-10 ENCOUNTER — Ambulatory Visit (HOSPITAL_BASED_OUTPATIENT_CLINIC_OR_DEPARTMENT_OTHER): Payer: Medicare Other | Attending: Internal Medicine | Admitting: Cardiology

## 2016-04-10 VITALS — Ht 63.0 in | Wt 130.0 lb

## 2016-04-10 DIAGNOSIS — G4719 Other hypersomnia: Secondary | ICD-10-CM

## 2016-04-10 DIAGNOSIS — R0602 Shortness of breath: Secondary | ICD-10-CM | POA: Insufficient documentation

## 2016-04-10 DIAGNOSIS — G4733 Obstructive sleep apnea (adult) (pediatric): Secondary | ICD-10-CM | POA: Insufficient documentation

## 2016-04-10 HISTORY — PX: SPLIT NIGHT STUDY: SLE1000

## 2016-04-12 ENCOUNTER — Encounter (HOSPITAL_COMMUNITY): Payer: Medicare Other | Admitting: Internal Medicine

## 2016-04-22 ENCOUNTER — Telehealth: Payer: Self-pay | Admitting: Cardiology

## 2016-04-22 ENCOUNTER — Encounter (HOSPITAL_BASED_OUTPATIENT_CLINIC_OR_DEPARTMENT_OTHER): Payer: Self-pay | Admitting: Cardiology

## 2016-04-22 DIAGNOSIS — G4719 Other hypersomnia: Secondary | ICD-10-CM | POA: Insufficient documentation

## 2016-04-22 NOTE — Telephone Encounter (Signed)
Please let patient know that sleep study showed no significant sleep apnea.    

## 2016-04-22 NOTE — Procedures (Signed)
   Patient Name: Emily Page, Emily Page MRN: [id number] Study Date: 04/10/2016 Gender: Female D.O.B: 26-Aug-1951 Age (years): 54 Referring Provider: Bevelyn Buckles Bensimhon Interpreting Physician: Armanda Magic MD, ABSM RPSGT: Neeriemer, Holly  Weight (lbs): 130 BMI: 23 Height (inches): 63 Neck Size: 12.00  CLINICAL INFORMATION Sleep Study Type: NPSG Indication for sleep study: Snoring Epworth Sleepiness Score: 2  SLEEP STUDY TECHNIQUE As per the AASM Manual for the Scoring of Sleep and Associated Events v2.3 (April 2016) with a hypopnea requiring 4% desaturations. The channels recorded and monitored were frontal, central and occipital EEG, electrooculogram (EOG), submentalis EMG (chin), nasal and oral airflow, thoracic and abdominal wall motion, anterior tibialis EMG, snore microphone, electrocardiogram, and pulse oximetry.  MEDICATIONS Patient's medications include: Reviewed in the chart. Medications self-administered by patient during sleep study : No sleep medicine administered.  SLEEP ARCHITECTURE The study was initiated at 11:00:17 PM and ended at 5:03:36 AM. Sleep onset time was 39.1 minutes and the sleep efficiency was reduced at 46.7%. The total sleep time was 169.5 minutes. Stage REM latency was reduced at 281.5 minutes. The patient spent 24.78% of the night in stage N1 sleep, 60.47% in stage N2 sleep, 6.19% in stage N3 and 8.55% in REM. Alpha intrusion was absent. Supine sleep was 0.59%.  RESPIRATORY PARAMETERS The overall apnea/hypopnea index (AHI) was 5.7 per hour. There were 14 total apneas, including 12 obstructive, 2 central and 0 mixed apneas. There were 2 hypopneas and 9 RERAs. The AHI during Stage REM sleep was 8.3 per hour. AHI while supine was 0.0 per hour. The mean oxygen saturation was 96.73%. The minimum SpO2 during sleep was 86.00%. Soft snoring was noted during this study.  CARDIAC DATA The 2 lead EKG demonstrated sinus rhythm. The mean heart rate was 69.54  beats per minute. Other EKG findings include: PVCs.  LEG MOVEMENT DATA The total PLMS were 0 with a resulting PLMS index of 0.00. Associated arousal with leg movement index was 0.0 .  IMPRESSIONS - Mild obstructive sleep apnea occurred during this study (AHI = 5.7/h). - No significant central sleep apnea occurred during this study (CAI = 0.7/h). - Moderate oxygen desaturation was noted during this study (Min O2 = 86.00%). - The patient snored with Soft snoring volume. - No cardiac abnormalities were noted during this study. - Clinically significant periodic limb movements did not occur during sleep. No significant associated arousals.  DIAGNOSIS - Obstructive Sleep Apnea (327.23 [G47.33 ICD-10])  RECOMMENDATIONS - Very minimal obstructive sleep apnea.  - Avoid alcohol, sedatives and other CNS depressants that may worsen sleep apnea and disrupt normal sleep architecture. - Sleep hygiene should be reviewed to assess factors that may improve sleep quality. - Weight management and regular exercise should be initiated or continued if appropriate.   Armanda Magic Diplomate, American Board of Sleep Medicine  ELECTRONICALLY SIGNED ON:  04/22/2016, 12:08 PM [facility] PH: [phone]   FX: [phone] ACCREDITED BY THE AMERICAN ACADEMY OF SLEEP MEDICINE

## 2016-04-24 ENCOUNTER — Ambulatory Visit (HOSPITAL_COMMUNITY)
Admission: RE | Admit: 2016-04-24 | Discharge: 2016-04-24 | Disposition: A | Discharge status: Home / Self Care | Payer: Medicare Other | Source: Ambulatory Visit | Attending: Internal Medicine | Admitting: Internal Medicine

## 2016-04-24 VITALS — BP 142/72 | HR 89 | Wt 135.0 lb

## 2016-04-24 DIAGNOSIS — R0602 Shortness of breath: Secondary | ICD-10-CM

## 2016-04-24 DIAGNOSIS — K219 Gastro-esophageal reflux disease without esophagitis: Secondary | ICD-10-CM | POA: Insufficient documentation

## 2016-04-24 DIAGNOSIS — G4733 Obstructive sleep apnea (adult) (pediatric): Secondary | ICD-10-CM | POA: Insufficient documentation

## 2016-04-24 DIAGNOSIS — R0600 Dyspnea, unspecified: Secondary | ICD-10-CM | POA: Insufficient documentation

## 2016-04-24 DIAGNOSIS — Z79899 Other long term (current) drug therapy: Secondary | ICD-10-CM | POA: Insufficient documentation

## 2016-04-24 DIAGNOSIS — R002 Palpitations: Secondary | ICD-10-CM

## 2016-04-24 DIAGNOSIS — Z882 Allergy status to sulfonamides status: Secondary | ICD-10-CM | POA: Diagnosis not present

## 2016-04-24 DIAGNOSIS — Z7982 Long term (current) use of aspirin: Secondary | ICD-10-CM | POA: Insufficient documentation

## 2016-04-24 DIAGNOSIS — M797 Fibromyalgia: Secondary | ICD-10-CM | POA: Diagnosis not present

## 2016-04-24 DIAGNOSIS — I7300 Raynaud's syndrome without gangrene: Secondary | ICD-10-CM | POA: Insufficient documentation

## 2016-04-24 DIAGNOSIS — Z888 Allergy status to other drugs, medicaments and biological substances status: Secondary | ICD-10-CM | POA: Insufficient documentation

## 2016-04-24 DIAGNOSIS — Z8249 Family history of ischemic heart disease and other diseases of the circulatory system: Secondary | ICD-10-CM | POA: Insufficient documentation

## 2016-04-24 DIAGNOSIS — I10 Essential (primary) hypertension: Secondary | ICD-10-CM | POA: Insufficient documentation

## 2016-04-24 DIAGNOSIS — Z880 Allergy status to penicillin: Secondary | ICD-10-CM | POA: Diagnosis not present

## 2016-04-24 NOTE — Patient Instructions (Signed)
Get Alive Cor  Regular Exercise  Follow up with Dr Mayford Knife

## 2016-04-24 NOTE — Progress Notes (Signed)
Patient ID: XANDRA VERHALEN, female   DOB: 1951-09-02, 65 y.o.   MRN: [id number] Patient ID: LOVERA CASTRONOVA, female   DOB: 10-Apr-1951, 65 y.o.   MRN: [id number]                 Advanced HF Clinc Note   Date:  04/24/2016   ID:  CHARMAYNE VARON, DOB 1951-07-21, MRN [id number]  PCP:  Gaye Alken, MD    No chief complaint on file.     History of Present Illness: CYNIAH BILINSKI is a 65 y.o. female with a history of fibromyalgia, GERD, migraines, PVC's, noncardiac CP with normal coronary arteries on cath 07/2014 and HTN who is referred by Dr. Mayford Knife for evaluation of of ongoing dyspnea and CP without clear etiology.   She has had CP and SOB since 2015. In 2015 she had mononucleosis then her husband lost both of his parents and she lost her father. Since that time she has had an extensive work-up as below which showed normal coronaries with LVEDP 3. On echo and cMRI EF 45-50% with grade 1 DD. RV normal   She had a cardiopulmonary stress test in 3/16 and 11/6 which was submaximal effort and showed mild cardiac limitation at most. PFTs normal. No exercise induced bronchospasm. She was placed in Cardiac Rehab for 3 months but did not notice that she had any improvement in the CP.  Her SOB is mildly improved.   Recently saw Dr. Delton Coombes who was concerned for upper airway disorder or restriction. CXR was normal. Full PFTs were normal.   I saw her last month and ordered chest CT which was normal.   Saw Dr. Delton Coombes in Pulmonary in [place] who reviewed CT and PFTs which were normal.   Here for f/u. Says she has been feeling pretty well until last few weeks. Since that time had several episodes where she felt weak and pulse went to 130s. Says it leaves her feeling weak for several hours. Not back to walking program yet.  Had sleep study 4/17: AHI 5.7. Sat down to 86%. Recommend improved sleep hygiene. [facility] are in [facility]. No cardio exercicses just lega nd arm weight,s    Event monitor 2/17  Sinus rhythm with occasional PVCs. No sustained arrhythmias.   Echo 8/15:  EF 45-50% Normal RV Grade 1 DD  Cardiac cath 9/15 Normal coronaries. LVEDP = 3  cMRI 11/16: 1) Normal RV size and function with no evidence of RV dysplasia 2) Mild LVE with mild diffuse hypokinesis EF 47% 3) No delayed gadolinium enhancement or evidence of scar/infiltration  CPX 11/16 FVC 2.83 (100%)    FEV1 2.13 (94%)     FEV1/FVC 75 (96%)     No exertion desat  Resting HR: 80 Peak HR: 125  (80% age predicted max HR) BP rest: 122/72 BP peak: 160/74 Peak VO2: 15.2 (67% predicted peak VO2) VE/VCO2 slope: 34.7 OUES: 1.11 Peak RER:   0.95 VE/MVV: 32.8% PETCO2 at peak: 35 O2pulse: 7  (77.8% predicted O2pulse)  Conclusion:  Interpretation of CPX data limited by submaximal effort. Based on submaximal parameters there is likely a mild cardiac limitation. When correcting for the submax effort there probably is not a significant change from her previous study in 3/16. If symptoms persist could consider CPX testing with invasive monitoring.     Past Medical History  Diagnosis Date  . Fibromyalgia     followed by Dr Bedelia Person  . Allergic rhinitis  followed by Dr Willa Rough in the past  . Migraines     [facility] past.   . GERD (gastroesophageal reflux disease)     Followed by Dr Randa Evens in the past  . 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 History of EKG     w palpitations and nonspecific T-wave changes, stress cardiolite 6/08 showed normal LV size in a systolic function,no maximum effort stress test with moderate work load and good HR achieved,no EKG criteria of ischemia. Did have occassional PVC's  . Raynaud's disease     followed by Dr Corliss Skains and norvasc has helped  . Foot pain, right     w intermittent swelling felt to be related to how she is walking on her foot. evaluated by Dr Leticia Penna  (podiatrist)  . Rosacea     and seborrheic dermatitis followed by Dr Levy Sjogren  . Hypertension   . PVC (premature ventricular contraction)     Past Surgical History  Procedure Laterality Date  . Left heart catheterization with coronary angiogram N/A 07/28/2014    Procedure: LEFT HEART CATHETERIZATION WITH CORONARY ANGIOGRAM;  Surgeon: Marykay Lex, MD;  Location: [facility] C[facility] LAB;  Service: Cardiovascular;  Laterality: N/A;  . Cardiac catheterization    . Split night study  04/10/2016     Current Outpatient Prescriptions  Medication Sig Dispense Refill  . amLODipine (NORVASC) 5 MG tablet TAKE 1 TABLET BY MOUTH EVERY EVENING 90 tablet 2  . aspirin EC 81 MG tablet Take 81 mg by mouth every evening.    Marland Kitchen azelastine (ASTELIN) 137 MCG/SPRAY nasal spray Place 2 sprays into both nostrils daily as needed for rhinitis or allergies. Use in each nostril as directed    . cetirizine (ZYRTEC) 10 MG tablet Take 10 mg by mouth daily.    . Cholecalciferol 2000 UNITS TABS Take 2,000 Units by mouth every morning.    . cyclobenzaprine (FLEXERIL) 10 MG tablet Take 2.5 mg by mouth at bedtime.   2  . diclofenac sodium (VOLTAREN) 1 % GEL Apply 2 g topically daily as needed (for pain).     Marland Kitchen eletriptan (RELPAX) 40 MG tablet Take 40 mg by mouth daily as needed for migraine or headache. Reported on 01/19/2016    . estradiol (ESTRACE) 2 MG tablet Take 2 mg by mouth daily.    . fluconazole (DIFLUCAN) 150 MG tablet Take 150 mg by mouth daily as needed (for yeast infection).     . fluticasone (FLONASE) 50 MCG/ACT nasal spray Place 1 spray into both nostrils as needed.     Marland Kitchen ketoconazole (NIZORAL) 2 % cream Apply 1 application topically daily.    Marland Kitchen ketoconazole (NIZORAL) 2 % shampoo Apply 1 application topically daily.     . Magnesium Malate 1250 (141.7 Mg) MG TABS Take 2 tablets by mouth daily.    . metronidazole (NORITATE) 1 % cream Apply topically daily.    Marland Kitchen nystatin-triamcinolone ointment (MYCOLOG) Apply 1 application  topically daily as needed (to rash).     . pantoprazole (PROTONIX) 40 MG tablet Take 40 mg by mouth daily.    . RESTASIS 0.05 % ophthalmic emulsion Place 1 drop into both eyes 2 (two) times daily.      No current facility-administered medications for this encounter.    Allergies:   Amoxil; Penicillins; Tape; Sulfa antibiotics; Zanaflex; and Polytrim    Social History:  The patient  reports that she has never smoked. She  has never used smokeless tobacco. She reports that she drinks about 8.4 oz of alcohol per week. She reports that she does not use illicit drugs.   Family History:  The patient's family history includes Heart attack in her father; Hyperlipidemia in her father; Hypertension in her father and mother.    ROS:  Please see the history of present illness.   Otherwise, review of systems are positive for none.   All other systems are reviewed and negative.    PHYSICAL EXAM: VS:  BP 142/72 mmHg  Pulse 89  Wt 135 lb (61.236 kg)  SpO2 98% , BMI Body mass index is 23.92 kg/(m^2). GEN: Well nourished, well developed, in no acute distress HEENT: normal Neck: no JVD, carotid bruits, or masses Cardiac: RRR; no murmurs, rubs, or gallops,no edema  Respiratory:  clear to auscultation bilaterally, normal work of breathing GI: soft, nontender, nondistended, + BS MS: no deformity or atrophy Skin: warm and dry, no rash Neuro:  Strength and sensation are intact Psych: euthymic mood, full affect   Recent Labs: 12/13/2015: BUN 13; Creatinine, Ser 0.77; Potassium 3.8; Sodium 139    Lipid Panel No results found for: CHOL, TRIG, HDL, CHOLHDL, VLDL, LDLCALC, LDLDIRECT    Wt Readings from Last 3 Encounters:  04/24/16 135 lb (61.236 kg)  04/10/16 130 lb (58.968 kg)  01/19/16 134 lb 8 oz (61.009 kg)    ASSESSMENT AND PLAN:  1. Dyspnea and exercise intolerance 2.  Tachypalpitations 3.  Fibromyalgia 4. Very mild OSA  I reviewed all testing with her and her husband as well as HR  tracings on her FitBit. At this point I cannot find any significant cardio-pulmonary problem and I reassured them that she seemed to be doing well. I feel she is deconditioned and stressed need for aerobic training. Also gave them information about using [employer] monitor as needed.   She can follow up with me PRN.    Tawanna Funk,MD 11:33 AM

## 2016-04-24 NOTE — Telephone Encounter (Signed)
Per DPR - Left a detailed message on cell phone with results. Left my number in case patient had any further questions.

## 2016-05-06 DIAGNOSIS — K644 Residual hemorrhoidal skin tags: Secondary | ICD-10-CM | POA: Diagnosis not present

## 2016-05-06 DIAGNOSIS — K5730 Diverticulosis of large intestine without perforation or abscess without bleeding: Secondary | ICD-10-CM | POA: Diagnosis not present

## 2016-05-06 DIAGNOSIS — R1310 Dysphagia, unspecified: Secondary | ICD-10-CM | POA: Diagnosis not present

## 2016-05-06 DIAGNOSIS — K222 Esophageal obstruction: Secondary | ICD-10-CM | POA: Diagnosis not present

## 2016-05-06 DIAGNOSIS — Z1211 Encounter for screening for malignant neoplasm of colon: Secondary | ICD-10-CM | POA: Diagnosis not present

## 2016-08-14 ENCOUNTER — Encounter: Payer: Self-pay | Admitting: Cardiology

## 2016-08-15 ENCOUNTER — Encounter: Payer: Self-pay | Admitting: Cardiology

## 2016-08-27 ENCOUNTER — Ambulatory Visit (INDEPENDENT_AMBULATORY_CARE_PROVIDER_SITE_OTHER): Payer: Medicare Other | Admitting: Rheumatology

## 2016-08-27 DIAGNOSIS — I7300 Raynaud's syndrome without gangrene: Secondary | ICD-10-CM | POA: Diagnosis not present

## 2016-08-27 DIAGNOSIS — M160 Bilateral primary osteoarthritis of hip: Secondary | ICD-10-CM

## 2016-08-27 DIAGNOSIS — M797 Fibromyalgia: Secondary | ICD-10-CM | POA: Diagnosis not present

## 2016-08-29 ENCOUNTER — Ambulatory Visit: Payer: Medicare Other | Admitting: Cardiology

## 2016-09-24 DIAGNOSIS — Z23 Encounter for immunization: Secondary | ICD-10-CM | POA: Diagnosis not present

## 2016-10-22 DIAGNOSIS — H2513 Age-related nuclear cataract, bilateral: Secondary | ICD-10-CM | POA: Diagnosis not present

## 2016-10-28 ENCOUNTER — Ambulatory Visit (INDEPENDENT_AMBULATORY_CARE_PROVIDER_SITE_OTHER): Payer: Medicare Other | Admitting: Cardiology

## 2016-10-28 ENCOUNTER — Encounter (INDEPENDENT_AMBULATORY_CARE_PROVIDER_SITE_OTHER): Payer: Self-pay

## 2016-10-28 ENCOUNTER — Encounter: Payer: Self-pay | Admitting: Cardiology

## 2016-10-28 VITALS — BP 126/78 | HR 77 | Ht 62.0 in | Wt 132.0 lb

## 2016-10-28 DIAGNOSIS — R0789 Other chest pain: Secondary | ICD-10-CM | POA: Diagnosis not present

## 2016-10-28 DIAGNOSIS — R42 Dizziness and giddiness: Secondary | ICD-10-CM | POA: Diagnosis not present

## 2016-10-28 DIAGNOSIS — G4733 Obstructive sleep apnea (adult) (pediatric): Secondary | ICD-10-CM | POA: Diagnosis not present

## 2016-10-28 DIAGNOSIS — I10 Essential (primary) hypertension: Secondary | ICD-10-CM | POA: Diagnosis not present

## 2016-10-28 DIAGNOSIS — I493 Ventricular premature depolarization: Secondary | ICD-10-CM | POA: Diagnosis not present

## 2016-10-28 HISTORY — DX: Obstructive sleep apnea (adult) (pediatric): G47.33

## 2016-10-28 NOTE — Patient Instructions (Addendum)
Medication Instructions:  Your physician recommends that you continue on your current medications as directed. Please refer to the Current Medication list given to you today.   Labwork: None  Testing/Procedures: None  Follow-Up: You have been referred to Dr. Graciela Husbands for evaluation of POTS.  Your physician recommends that you schedule a follow-up appointment AS NEEDED with Dr. Mayford Knife.  Any Other Special Instructions Will Be Listed Below (If Applicable).     If you need a refill on your cardiac medications before your next appointment, please call your pharmacy.

## 2016-10-28 NOTE — Progress Notes (Signed)
Cardiology Office Note    Date:  10/28/2016   ID:  Emily Page, DOB 10/26/1951, MRN [id number]  PCP:  Gerrit Heck, MD  Cardiologist:  Fransico Him, MD   Chief Complaint  Patient presents with  . Hypertension  . Dizziness  . Chest Pain    History of Present Illness:  Emily Page is a 65 y.o. female with a history of PVC's, noncardiac CP with normal coronary arteries on cath 07/2014 and HTN who presents today for followup. She is doing well. She had a cardiopulmonary stress test done which showed a mild functional capacity consistent with primary cardiac limitation.  She was placed in Cardiac Rehab for 3 months but did not notice that she had any improvement in the CP. She was seen by Dr. Haroldine Laws and all cardiac workup was normal and he felt that she was deconditioned and needed aerobic training.  She did have a sleep study that showed very mild OSA.  She has some mild sharp CP from time to time and her chronic SOB has not changed.  She says that she has been having problems with her heart rate suddenly increasing to 140bpm and her BP plummets and she gets very weak.  She has not had any syncope recently.  Occasionally she will have some LE edema.     Past Medical History:  Diagnosis Date  . Allergic rhinitis    followed by Dr Ishmael Holter in the past  . Fibromyalgia    followed by Dr Dora Sims  . Foot pain, right    w intermittent swelling felt to be related to how she is walking on her foot. evaluated by Dr Geroge Baseman (podiatrist)  . GERD (gastroesophageal reflux disease)    Followed by Dr Oletta Lamas in the past  . Hip dislocation, bilateral ([place])    as infant. was corrected  . History of EKG    w palpitations and nonspecific T-wave changes, stress cardiolite 6/08 showed normal LV size in a systolic function,no maximum effort stress test with moderate work load and good HR achieved,no EKG criteria of ischemia. Did have occassional PVC's  . Hypertension   .  Migraines    followed by [facility] in past.   . OSA (obstructive sleep apnea) 10/28/2016   Very mild  . PVC (premature ventricular contraction)   . Raynaud's disease    followed by Dr Estanislado Pandy and norvasc has helped  . Rosacea    and seborrheic dermatitis followed by Dr Althia Forts    Past Surgical History:  Procedure Laterality Date  . CARDIAC CATHETERIZATION    . LEFT HEART CATHETERIZATION WITH CORONARY ANGIOGRAM N/A 07/28/2014   Procedure: LEFT HEART CATHETERIZATION WITH CORONARY ANGIOGRAM;  Surgeon: Leonie Man, MD;  Location: [facility] CATH LAB;  Service: Cardiovascular;  Laterality: N/A;  . SPLIT NIGHT STUDY  04/10/2016    Current Medications: Outpatient Medications Prior to Visit  Medication Sig Dispense Refill  . amLODipine (NORVASC) 5 MG tablet TAKE 1 TABLET BY MOUTH EVERY EVENING 90 tablet 2  . aspirin EC 81 MG tablet Take 81 mg by mouth every evening.    Marland Kitchen azelastine (ASTELIN) 137 MCG/SPRAY nasal spray Place 2 sprays into both nostrils daily as needed for rhinitis or allergies. Use in each nostril as directed    . cetirizine (ZYRTEC) 10 MG tablet Take 10 mg by mouth daily.    . Cholecalciferol 2000 UNITS TABS Take 2,000 Units by mouth every morning.    . cyclobenzaprine (FLEXERIL) 10 MG  tablet Take 2.5 mg by mouth at bedtime.   2  . diclofenac sodium (VOLTAREN) 1 % GEL Apply 2 g topically daily as needed (for pain).     Marland Kitchen eletriptan (RELPAX) 40 MG tablet Take 40 mg by mouth daily as needed for migraine or headache. Reported on 01/19/2016    . estradiol (ESTRACE) 2 MG tablet Take 2 mg by mouth daily.    . fluconazole (DIFLUCAN) 150 MG tablet Take 150 mg by mouth daily as needed (for yeast infection).     . fluticasone (FLONASE) 50 MCG/ACT nasal spray Place 1 spray into both nostrils as needed.     Marland Kitchen ketoconazole (NIZORAL) 2 % cream Apply 1 application topically daily.    Marland Kitchen ketoconazole (NIZORAL) 2 % shampoo Apply 1 application topically daily.     . Magnesium Malate  1250 (141.7 Mg) MG TABS Take 2 tablets by mouth daily.    . metronidazole (NORITATE) 1 % cream Apply topically daily.    Marland Kitchen nystatin-triamcinolone ointment (MYCOLOG) Apply 1 application topically daily as needed (to rash).     . RESTASIS 0.05 % ophthalmic emulsion Place 1 drop into both eyes 2 (two) times daily.     . pantoprazole (PROTONIX) 40 MG tablet Take 40 mg by mouth daily.     No facility-administered medications prior to visit.      Allergies:   Amoxil [amoxicillin]; Penicillins; Tape; Sulfa antibiotics; Zanaflex [tizanidine]; and Polytrim [polymyxin b-trimethoprim]   Social History   Social History  . Marital status: Married    Spouse name: N/A  . Number of children: N/A  . Years of education: N/A   Social History Main Topics  . Smoking status: Never Smoker  . Smokeless tobacco: Never Used     Comment: never used tobacco  . Alcohol use 8.4 oz/week    7 Glasses of wine, 7 Shots of liquor per week     Comment: 1-2 daily, wine  . Drug use: No  . Sexual activity: Not Asked   Other Topics Concern  . None   Social History Narrative  . None     Family History:  The patient's family history includes Heart attack in her father; Hyperlipidemia in her father; Hypertension in her father and mother.   ROS:   Please see the history of present illness.    ROS All other systems reviewed and are negative.  No flowsheet data found.     PHYSICAL EXAM:   VS:  BP 126/78   Pulse 77   Ht 5' 2"  (1.575 m)   Wt 132 lb (59.9 kg)   BMI 24.14 kg/m    GEN: Well nourished, well developed, in no acute distress  HEENT: normal  Neck: no JVD, carotid bruits, or masses Cardiac: RRR; no murmurs, rubs, or gallops,no edema.  Intact distal pulses bilaterally.  Respiratory:  clear to auscultation bilaterally, normal work of breathing GI: soft, nontender, nondistended, + BS MS: no deformity or atrophy  Skin: warm and dry, no rash Neuro:  Alert and Oriented x 3, Strength and sensation are  intact Psych: euthymic mood, full affect  Wt Readings from Last 3 Encounters:  10/28/16 132 lb (59.9 kg)  04/24/16 135 lb (61.2 kg)  04/10/16 130 lb (59 kg)      Studies/Labs Reviewed:   EKG:  EKG is ordered today.  The ekg ordered today demonstrates NSR at 77bpm with nonspecific ST abnormality with PVC  Recent Labs: 12/13/2015: BUN 13; Creatinine, Ser 0.77; Potassium 3.8; Sodium  139   Lipid Panel No results found for: CHOL, TRIG, HDL, CHOLHDL, VLDL, LDLCALC, LDLDIRECT  Additional studies/ records that were reviewed today include:  none    ASSESSMENT:    1. Essential hypertension   2. PVC (premature ventricular contraction)   3. OSA (obstructive sleep apnea)   4. Chest pain, atypical   5. Dizziness      PLAN:  In order of problems listed above:  1. HTN - BP controlled on current meds.  Continue amlodipine. 2. PVCs - asymptomatic.   3. OSA - this is very mild and I do not think that it is the reason for her chronic fatigue/SOB and CP. 4.   Chronic CP and SOB with normal cardiac workup including cardiopulmonary stress test and cath.  5.   Dizziness and palpitations with negative heart monitor.  ? POTS - I will refer to Dr. Caryl Comes for evaluation as I have exhausted all other cardiac etiology for her myriad of symptoms.  She seems to be very in tune with her heart beat and wonder if some of this is anxiety. If he feels there is no other cardiac etiology then I will see her back on PRN basis and she can followup with her PCP regarding BP management.   Medication Adjustments/Labs and Tests Ordered: Current medicines are reviewed at length with the patient today.  Concerns regarding medicines are outlined above.  Medication changes, Labs and Tests ordered today are listed in the Patient Instructions below.  There are no Patient Instructions on file for this visit.   Signed, Fransico Him, MD  10/28/2016 1:50 PM    [facility] [place],  [place], [place]  [address] Phone: [phone]; Fax: [phone]

## 2016-11-08 ENCOUNTER — Encounter: Payer: Self-pay | Admitting: Internal Medicine

## 2016-11-08 ENCOUNTER — Institutional Professional Consult (permissible substitution): Payer: Medicare Other | Admitting: Internal Medicine

## 2016-11-21 ENCOUNTER — Other Ambulatory Visit: Payer: Self-pay | Admitting: Cardiology

## 2016-11-28 ENCOUNTER — Institutional Professional Consult (permissible substitution): Payer: Medicare Other | Admitting: Internal Medicine

## 2016-11-28 NOTE — Progress Notes (Deleted)
ELECTROPHYSIOLOGY CONSULT NOTE  Patient ID: Emily Page, MRN: [id number], DOB/AGE: 1951-05-19 66 y.o. Admit date: (Not on file) Date of Consult: 11/28/2016  Primary Physician: Gaye Alken, MD Primary Cardiologist: *** Consulting Physician ***  Chief Complaint: ***   HPI Emily Page is a 66 y.o. female  seen for heart rate and blood pressure abnormalities identified in the context of a chest pain/shortness of breath syndrome dating back to 2015. At that time, she apparently had mononucleosis left (??)  She has a history of normal coronary arteries at catheterization 9/15. She has a history of PVCs. Cardiopulmonary stress testing for *** showed only a mild functional limitation.   Past Medical History:  Diagnosis Date  . Allergic rhinitis    followed by Dr Willa Rough in the past  . Fibromyalgia    followed by Dr Bedelia Person  . Foot pain, right    w intermittent swelling felt to be related to how she is walking on her foot. evaluated by Dr Leticia Penna (podiatrist)  . GERD (gastroesophageal reflux disease)    Followed by Dr Randa Evens in the past  . 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 History of EKG    w palpitations and nonspecific T-wave changes, stress cardiolite 6/08 showed normal LV size in a systolic function,no maximum effort stress test with moderate work load and good HR achieved,no EKG criteria of ischemia. Did have occassional PVC's  . Hypertension   . Migraines    follow[facility] past.   . OSA (obstructive sleep apnea) 10/28/2016   Very mild  . PVC (premature ventricular contraction)   . Raynaud's disease    followed by Dr Corliss Skains and norvasc has helped  . Rosacea    and seborrheic dermatitis followed by Dr Levy Sjogren      Surgical History:  Past Surgical History:  Procedure Laterality Date  . CARDIAC CATHETERIZATION    . LEFT HEART CATHETERIZATION WITH CORONARY ANGIOGRAM N/A 07/28/2014   Procedure: LEFT HEART CATHETERIZATION WITH CORONARY ANGIOGRAM;  Surgeon: Marykay Lex, MD;  Location: [facility] CATH LAB;  Service: Cardiovascular;  Laterality: N/A;  . SPLIT NIGHT STUDY  04/10/2016     Home Meds: Prior to Admission medications   Medication Sig Start Date End Date Taking? Authorizing Provider  amLODipine (NORVASC) 5 MG tablet Take 1 tablet (5 mg total) by mouth every evening. 11/22/16   Quintella Reichert, MD  aspirin EC 81 MG tablet Take 81 mg by mouth every evening.    Historical Provider, MD  azelastine (ASTELIN) 137 MCG/SPRAY nasal spray Place 2 sprays into both nostrils daily as needed for rhinitis or allergies. Use in each nostril as directed    Historical Provider, MD  cetirizine (ZYRTEC) 10 MG tablet Take 10 mg by mouth daily.    Historical Provider, MD  Cholecalciferol 2000 UNITS TABS Take 2,000 Units by mouth every morning.    Historical Provider, MD  cyclobenzaprine (FLEXERIL) 10 MG tablet Take 2.5 mg by mouth at bedtime.  01/19/15   Historical Provider, MD  diclofenac sodium (VOLTAREN) 1 % GEL Apply 2 g topically daily as needed (for pain).     Historical Provider, MD  eletriptan (RELPAX) 40 MG tablet Take 40 mg by mouth daily as needed for migraine or headache. Reported on 01/19/2016    Historical Provider, MD  esomeprazole (NEXIUM) 40 MG capsule Take 40 mg by mouth daily at 12 noon.    Historical Provider,  MD  estradiol (ESTRACE) 2 MG tablet Take 2 mg by mouth daily.    Historical Provider, MD  fluconazole (DIFLUCAN) 150 MG tablet Take 150 mg by mouth daily as needed (for yeast infection).     Historical Provider, MD  fluticasone (FLONASE) 50 MCG/ACT nasal spray Place 1 spray into both nostrils as needed.     Historical Provider, MD  ketoconazole (NIZORAL) 2 % cream Apply 1 application topically daily.    Historical Provider, MD  ketoconazole (NIZORAL) 2 % shampoo Apply 1 application topically daily.  02/10/14   Historical Provider, MD  Magnesium Malate 1250 (141.7 Mg) MG TABS  Take 2 tablets by mouth daily.    Historical Provider, MD  metronidazole (NORITATE) 1 % cream Apply topically daily.    Historical Provider, MD  nystatin-triamcinolone ointment (MYCOLOG) Apply 1 application topically daily as needed (to rash).  06/27/14   Historical Provider, MD  RESTASIS 0.05 % ophthalmic emulsion Place 1 drop into both eyes 2 (two) times daily.  12/10/13   Historical Provider, MD    Allergies:  Allergies  Allergen Reactions  . Amoxil [Amoxicillin]     Felt like throat was swelling  . Penicillins     Swelling of throat  . Tape     Electrodes cause Blisters, even with the sensitive kind   . Sulfa Antibiotics     Swelling of eyes  . Zanaflex [Tizanidine]     halluciations  . Polytrim [Polymyxin B-Trimethoprim]     unknown    Social History   Social History  . Marital status: Married    Spouse name: N/A  . Number of children: N/A  . Years of education: N/A   Occupational History  . Not on file.   Social History Main Topics  . Smoking status: Never Smoker  . Smokeless tobacco: Never Used     Comment: never used tobacco  . Alcohol use 8.4 oz/week    7 Glasses of wine, 7 Shots of liquor per week     Comment: 1-2 daily, wine  . Drug use: No  . Sexual activity: Not on file   Other Topics Concern  . Not on file   Social History Narrative  . No narrative on file     Family History  Problem Relation Age of Onset  . Hypertension Mother   . Heart attack Father   . Hypertension Father   . Hyperlipidemia Father      ROS:  Please see the history of present illness.   {ros [id number]}  All other systems reviewed and negative.    Physical Exam:*** There were no vitals taken for this visit. General: Well developed, well nourished female in no acute distress. Head: Normocephalic, atraumatic, sclera non-icteric, no xanthomas, nares are without discharge. EENT: normal  Lymph Nodes:  none Neck: Negative for carotid bruits. JVD not  elevated. Back:without scoliosis kyphosis*** Lungs: Clear bilaterally to auscultation without wheezes, rales, or rhonchi. Breathing is unlabored. Heart: RRR with S1 S2. No *** ***/6 systolic*** murmur . No rubs, or gallops appreciated. Abdomen: Soft, non-tender, non-distended with normoactive bowel sounds. No hepatomegaly. No rebound/guarding. No obvious abdominal masses. Msk:  Strength and tone appear normal for age. Extremities: No clubbing or cyanosis. No*** ***+*** edema.  Distal pedal pulses are 2+ and equal bilaterally. Skin: Warm and Dry Neuro: Alert and oriented X 3. CN III-XII intact Grossly normal sensory and motor function . Psych:  Responds to questions appropriately with a normal affect.  Labs: Cardiac Enzymes No results for input(s): CKTOTAL, CKMB, TROPONINI in the last 72 hours. CBC Lab Results  Component Value Date   WBC 7.6 07/21/2014   HGB 13.9 07/21/2014   HCT 41.3 07/21/2014   MCV 92.8 07/21/2014   PLT 250.0 07/21/2014   PROTIME: No results for input(s): LABPROT, INR in the last 72 hours. Chemistry No results for input(s): NA, K, CL, CO2, BUN, CREATININE, CALCIUM, PROT, BILITOT, ALKPHOS, ALT, AST, GLUCOSE in the last 168 hours.  Invalid input(s): LABALBU Lipids No results found for: CHOL, HDL, LDLCALC, TRIG BNP No results found for: PROBNP Thyroid Function Tests: No results for input(s): TSH, T4TOTAL, T3FREE, THYROIDAB in the last 72 hours.  Invalid input(s): FREET3 Miscellaneous No results found for: DDIMER  Radiology/Studies:  No results found.  EKG: ***   Assessment and Plan: *** Sherryl Manges

## 2016-12-03 ENCOUNTER — Encounter: Payer: Self-pay | Admitting: Internal Medicine

## 2016-12-11 ENCOUNTER — Institutional Professional Consult (permissible substitution): Payer: Medicare Other | Admitting: Internal Medicine

## 2016-12-12 ENCOUNTER — Institutional Professional Consult (permissible substitution): Payer: Medicare Other | Admitting: Internal Medicine

## 2016-12-17 ENCOUNTER — Encounter: Payer: Self-pay | Admitting: Internal Medicine

## 2016-12-17 ENCOUNTER — Ambulatory Visit (INDEPENDENT_AMBULATORY_CARE_PROVIDER_SITE_OTHER): Payer: Medicare Other | Admitting: Internal Medicine

## 2016-12-17 VITALS — BP 157/78 | HR 95 | Ht 62.0 in | Wt 134.6 lb

## 2016-12-17 DIAGNOSIS — I493 Ventricular premature depolarization: Secondary | ICD-10-CM

## 2016-12-17 DIAGNOSIS — R42 Dizziness and giddiness: Secondary | ICD-10-CM | POA: Diagnosis not present

## 2016-12-17 DIAGNOSIS — R002 Palpitations: Secondary | ICD-10-CM | POA: Diagnosis not present

## 2016-12-17 DIAGNOSIS — I10 Essential (primary) hypertension: Secondary | ICD-10-CM

## 2016-12-17 MED ORDER — LISINOPRIL 10 MG PO TABS: 10.0000 mg | ORAL_TABLET | Freq: Every day | ORAL | 3 refills | Status: DC

## 2016-12-17 NOTE — Progress Notes (Signed)
ELECTROPHYSIOLOGY CONSULT NOTE  Patient ID: Emily Page, MRN: [id number], DOB/AGE: Mar 27, 1951 66 y.o. Admit date: (Not on file) Date of Consult: 12/17/2016  Primary Physician: Gaye Alken, MD Primary Cardiologist: TT Consulting Physician TT  Chief Complaint: spells    HPI Emily Page is a 66 y.o. female  Referred for spells.  These are going on for 10 years plus with 20 bad spells a year some semblance of this occurring a couple of times a day. They are stereotypical. Her bad spells are characterized by ringing in her ears visual loss flushing subsequent diaphoresis pallor residual orthostatic intolerance and fatigue. Typical triggers include prolonged standing, heat exposure i.e. showers, her symptoms are worse in the morning and probably in the summer.    she has a long-standing history also of dry eyes and dry mouth as well as Raynaud's phenomenon. She does not have problems with bladder and/or bowel function.   Cardiopulmonary  20 stress testing 2016 demonstrated a sub-max effort. Cardiac MRI 2016 normal RV size LVEF 47% without gadolinium enhancement Echocardiogram in 2016 EF 45-50%. Notably echo 2014 was normal  Event recorder was reviewed reviewed personally. The one episode of palpitations was associated with a tachycardia with a P wave morphology that appears similar to sinus   asymptomatic PVCs     Past Medical History:  Diagnosis Date  . Allergic rhinitis    followed by Dr Willa Rough in the past  . Fibromyalgia    followed by Dr Bedelia Person  . Foot pain, right    w intermittent swelling felt to be related to how she is walking on her foot. evaluated by Dr Leticia Penna (podiatrist)  . GERD (gastroesophageal reflux disease)    Followed by Dr Randa Evens in the past  . 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 History of EKG    w palpitations and nonspecific T-wave changes, stress cardiolite 6/08 showed normal LV size in a  systolic function,no maximum effort stress test with moderate work load and good HR achieved,no EKG criteria of ischemia. Did have occassional PVC's  . Hypertension   . Migraines    followed [facility] in past.   . OSA (obstructive sleep apnea) 10/28/2016   Very mild  . PVC (premature ventricular contraction)   . Raynaud's disease    followed by Dr Corliss Skains and norvasc has helped  . Rosacea    and seborrheic dermatitis followed by Dr Levy Sjogren      Surgical History:  Past Surgical History:  Procedure Laterality Date  . CARDIAC CATHETERIZATION    . LEFT HEART CATHETERIZATION WITH CORONARY ANGIOGRAM N/A 07/28/2014   Procedure: LEFT HEART CATHETERIZATION WITH CORONARY ANGIOGRAM;  Surgeon: Marykay Lex, MD;  Location: [facility] CATH LAB;  Service: Cardiovascular;  Laterality: N/A;  . SPLIT NIGHT STUDY  04/10/2016     Home Meds: Prior to Admission medications   Medication Sig Start Date End Date Taking? Authorizing Provider  aspirin EC 81 MG tablet Take 81 mg by mouth every evening.   Yes Historical Provider, MD  azelastine (ASTELIN) 137 MCG/SPRAY nasal spray Place 2 sprays into both nostrils daily as needed for rhinitis or allergies. Use in each nostril as directed   Yes Historical Provider, MD  cetirizine (ZYRTEC) 10 MG tablet Take 10 mg by mouth daily.   Yes Historical Provider, MD  Cholecalciferol 2000 UNITS TABS Take 2,000 Units by mouth every morning.   Yes Historical Provider, MD  cyclobenzaprine (  FLEXERIL) 10 MG tablet Take 2.5 mg by mouth at bedtime.  01/19/15  Yes Historical Provider, MD  diclofenac sodium (VOLTAREN) 1 % GEL Apply 2 g topically daily as needed (for pain).    Yes Historical Provider, MD  esomeprazole (NEXIUM) 40 MG capsule Take 40 mg by mouth daily at 12 noon.   Yes Historical Provider, MD  estradiol (ESTRACE) 2 MG tablet Take 2 mg by mouth daily.   Yes Historical Provider, MD  fluconazole (DIFLUCAN) 150 MG tablet Take 150 mg by mouth daily as needed (for  yeast infection).    Yes Historical Provider, MD  fluticasone (FLONASE) 50 MCG/ACT nasal spray Place 1 spray into both nostrils as needed.    Yes Historical Provider, MD  ketoconazole (NIZORAL) 2 % shampoo Apply 1 application topically daily.  02/10/14  Yes Historical Provider, MD  Magnesium Malate 1250 (141.7 Mg) MG TABS Take 2 tablets by mouth daily.   Yes Historical Provider, MD  nystatin-triamcinolone ointment (MYCOLOG) Apply 1 application topically daily as needed (to rash).  06/27/14  Yes Historical Provider, MD  RESTASIS 0.05 % ophthalmic emulsion Place 1 drop into both eyes 2 (two) times daily.  12/10/13  Yes Historical Provider, MD    Allergies:  Allergies  Allergen Reactions  . Amoxil [Amoxicillin]     Felt like throat was swelling  . Penicillins     Swelling of throat  . Tape     Electrodes cause Blisters, even with the sensitive kind   . Sulfa Antibiotics     Swelling of eyes  . Zanaflex [Tizanidine]     halluciations  . Polytrim [Polymyxin B-Trimethoprim]     unknown    Social History   Social History  . Marital status: Married    Spouse name: N/A  . Number of children: N/A  . Years of education: N/A   Occupational History  . Not on file.   Social History Main Topics  . Smoking status: Never Smoker  . Smokeless tobacco: Never Used     Comment: never used tobacco  . Alcohol use 8.4 oz/week    7 Glasses of wine, 7 Shots of liquor per week     Comment: 1-2 daily, wine  . Drug use: No  . Sexual activity: Not on file   Other Topics Concern  . Not on file   Social History Narrative  . No narrative on file     Family History  Problem Relation Age of Onset  . Hypertension Mother   . Heart attack Father   . Hypertension Father   . Hyperlipidemia Father      ROS:  Please see the history of present illness.     All other systems reviewed and negative.    Physical Exam: Blood pressure (!) 157/78, pulse 95, height 5\' 2"  (1.575 m), weight 134 lb 9.6 oz  (61.1 kg), SpO2 98 %. General: Well developed, well nourished female in no acute distress. Head: Normocephalic, atraumatic, sclera non-icteric, no xanthomas, nares are without discharge. EENT: normal  Lymph Nodes:  none Neck: Negative for carotid bruits. JVD not elevated. Back:without scoliosis kyphosis Lungs: Clear bilaterally to auscultation without wheezes, rales, or rhonchi. Breathing is unlabored. Heart: RRR with S1 S2. No  murmur . No rubs, or gallops appreciated. Abdomen: Soft, non-tender, non-distended with normoactive bowel sounds. No hepatomegaly. No rebound/guarding. No obvious abdominal masses. Msk:  Strength and tone appear normal for age. Extremities: No clubbing or cyanosis. No edema.  Distal pedal pulses are 2+  and equal bilaterally. Skin: Warm and Dry Neuro: Alert and oriented X 3. CN III-XII intact Grossly normal sensory and motor function . Psych:  Responds to questions appropriately with a normal affect.      Labs: Cardiac Enzymes No results for input(s): CKTOTAL, CKMB, TROPONINI in the last 72 hours. CBC Lab Results  Component Value Date   WBC 7.6 07/21/2014   HGB 13.9 07/21/2014   HCT 41.3 07/21/2014   MCV 92.8 07/21/2014   PLT 250.0 07/21/2014   PROTIME: No results for input(s): LABPROT, INR in the last 72 hours. Chemistry No results for input(s): NA, K, CL, CO2, BUN, CREATININE, CALCIUM, PROT, BILITOT, ALKPHOS, ALT, AST, GLUCOSE in the last 168 hours.  Invalid input(s): LABALBU Lipids No results found for: CHOL, HDL, LDLCALC, TRIG BNP No results found for: PROBNP Thyroid Function Tests: No results for input(s): TSH, T4TOTAL, T3FREE, THYROIDAB in the last 72 hours.  Invalid input(s): FREET3 Miscellaneous No results found for: DDIMER  Radiology/Studies:  No results found.  EKG: Sinus at 74 Intervals 11/02/37 Are T-wave inversions in lead V2-V5. There is low voltage area of     Assessment and Plan:  Dysautonomia  Abnormal  ECG  Cardiomyopathy  Hypertension  The patient has symptoms consistent with dysautonomia. These are predominantly related to orthostatic intolerance. We have reviewed the physiology and the importance of recognizing triggers. She's fluid replete  and salt replete. With her blood pressure salt is not her best friend.  Given her cardiomyopathy, we will discontinue her amlodipine and she's had some problems with peripheral edema and begin her on an ACE inhibitor. She will need a metabolic profile in about 2 weeks. Notably it was normal 1/17  Her MRI was illuminated as relates to causes of her cardiac myopathy. As noted above we will begin her on an ACE inhibitor which has been demonstrated to decrease the likelihood of heart failure  We spent more thatn 100 minutes on this eval of > 50% counseling    Sherryl Manges

## 2016-12-17 NOTE — Patient Instructions (Addendum)
Medication Instructions: - Your physician has recommended you make the following change in your medication:  1) Stop norvasc (amlodipine)  2) Start lisinopril 10 mg- take one tablet by mouth once daily  Labwork: - Your physician recommends that you return for lab work in: 2 weeks- BMP  Procedures/Testing: - none ordered  Follow-Up: - Your physician wants you to follow-up in: 4 months with Dr. Graciela Husbands. You will receive a reminder letter in the mail two months in advance. If you don't receive a letter, please call our office to schedule the follow-up appointment.   Any Additional Special Instructions Will Be Listed Below (If Applicable).     If you need a refill on your cardiac medications before your next appointment, please call your pharmacy.

## 2016-12-18 ENCOUNTER — Other Ambulatory Visit: Payer: Self-pay | Admitting: *Deleted

## 2016-12-18 MED ORDER — CYCLOBENZAPRINE HCL 10 MG PO TABS: 10.0000 mg | ORAL_TABLET | Freq: Every day | ORAL | 1 refills | Status: DC

## 2016-12-18 NOTE — Telephone Encounter (Signed)
ok 

## 2016-12-18 NOTE — Telephone Encounter (Signed)
Refill request received via fax for Cyclobezaprine  Last Visit: 08/27/16 Next Visit due April 2018. Message sent to the front to schedule patient.   Okay to refill Cyclobenzaprine?

## 2016-12-24 ENCOUNTER — Telehealth: Payer: Self-pay | Admitting: Radiology

## 2016-12-24 NOTE — Telephone Encounter (Signed)
request received via fax for prior auth of Cyclobenzoprene, Dr Corliss Skains has writtenshe would like to change to Tizanidine 4 mg at bedtime if patient is agreeable

## 2016-12-26 NOTE — Telephone Encounter (Signed)
Attempted to contact the patient and left message for patient to call the office.  

## 2016-12-31 NOTE — Telephone Encounter (Signed)
Patient states she is going to continue with the Cyclobenzaprine. Patient states she will pay out of pocket for the medication.

## 2017-01-01 ENCOUNTER — Other Ambulatory Visit (INDEPENDENT_AMBULATORY_CARE_PROVIDER_SITE_OTHER): Payer: Medicare Other

## 2017-01-01 DIAGNOSIS — I10 Essential (primary) hypertension: Secondary | ICD-10-CM | POA: Diagnosis not present

## 2017-01-01 DIAGNOSIS — R002 Palpitations: Secondary | ICD-10-CM | POA: Diagnosis not present

## 2017-01-01 DIAGNOSIS — I493 Ventricular premature depolarization: Secondary | ICD-10-CM | POA: Diagnosis not present

## 2017-01-01 DIAGNOSIS — R42 Dizziness and giddiness: Secondary | ICD-10-CM

## 2017-01-02 LAB — BASIC METABOLIC PANEL
BUN/Creatinine Ratio: 14 (ref 12–28)
BUN: 11 mg/dL (ref 8–27)
CO2: 23 mmol/L (ref 18–29)
Calcium: 8.6 mg/dL — ABNORMAL LOW (ref 8.7–10.3)
Chloride: 103 mmol/L (ref 96–106)
Creatinine, Ser: 0.76 mg/dL (ref 0.57–1.00)
GFR calc non Af Amer: 82 mL/min/{1.73_m2} (ref >59)
GFR, EST AFRICAN AMERICAN: 95 mL/min/{1.73_m2} (ref >59)
Glucose: 97 mg/dL (ref 65–99)
POTASSIUM: 4.8 mmol/L (ref 3.5–5.2)
SODIUM: 141 mmol/L (ref 134–144)

## 2017-01-03 ENCOUNTER — Telehealth: Payer: Self-pay | Admitting: Internal Medicine

## 2017-01-03 NOTE — Telephone Encounter (Signed)
I called and spoke with the patient regarding her lab results.

## 2017-01-03 NOTE — Telephone Encounter (Signed)
Follow Up ° ° °Pt returning phone call from nurse °

## 2017-01-21 ENCOUNTER — Telehealth: Payer: Self-pay | Admitting: Internal Medicine

## 2017-01-21 MED ORDER — AMLODIPINE BESYLATE 2.5 MG PO TABS: 2.5000 mg | ORAL_TABLET | Freq: Every day | ORAL | Status: DC

## 2017-01-21 NOTE — Telephone Encounter (Signed)
Called to spoke with patient regarding BP  170 range>>150/74 Will have her add back to amlodipine 2.5 mg And have asked her to take an extra lisinopril tonight. I've told her it is important to make sure that the headache abates. I'm not sure that the headache is not the cause of the high blood pressure as opposed to the other way around. I will forward these results to Dr. Mayford Knife so that she and the patient's primary care physician can manage this going forward.

## 2017-01-21 NOTE — Telephone Encounter (Signed)
I spoke with the patient. She states that she developed a headache starting Friday evening. She monitored her BP and she was 177-185 systolic all weekend. Her BP this morning was 177/74. She saw Dr. Graciela Husbands in late January and her amlodipine was stopped due to some swelling- she was placed on lisinopril 10 mg once daily. She has not monitored her BP since switching her medications until she noticed the headache on Friday. She does take her lisinopril 10 mg at bedtime. She confirms no other recent medication changes or increased sodium intake. She continues with the headache- I have advised her she may take tylenol for this if no allergy.  I will review with Dr. Graciela Husbands and call her back.  She is agreeable.

## 2017-01-21 NOTE — Telephone Encounter (Signed)
New message      Pt c/o BP issue: STAT if pt c/o blurred vision, one-sided weakness or slurred speech  1. What are your last 5 BP readings? 185/85 2. Are you having any other symptoms (ex. Dizziness, headache, blurred vision, passed out)? headache 3. What is your BP issue?  Pt is concerned that her bp is high.  Please advise

## 2017-01-22 DIAGNOSIS — Z1231 Encounter for screening mammogram for malignant neoplasm of breast: Secondary | ICD-10-CM | POA: Diagnosis not present

## 2017-01-22 DIAGNOSIS — Z6824 Body mass index (BMI) 24.0-24.9, adult: Secondary | ICD-10-CM | POA: Diagnosis not present

## 2017-01-22 DIAGNOSIS — Z124 Encounter for screening for malignant neoplasm of cervix: Secondary | ICD-10-CM | POA: Diagnosis not present

## 2017-01-22 NOTE — Telephone Encounter (Signed)
Left message to call back  

## 2017-01-22 NOTE — Telephone Encounter (Signed)
Please have her followup in HTN clinic in 1 week

## 2017-01-23 ENCOUNTER — Encounter: Payer: Self-pay | Admitting: Internal Medicine

## 2017-01-23 ENCOUNTER — Telehealth: Payer: Self-pay | Admitting: Internal Medicine

## 2017-01-23 NOTE — Telephone Encounter (Signed)
I called and spoke with the patient. She is inquiring about scheduling her follow up with Dr. Graciela Husbands- she is due for a 4 month f/u in May. I have scheduled her to come in on 04/24/17 at 2:15 pm. She voices understanding.  I also confirmed her appointment with the hypertension clinic on 3/14 per Dr. Mayford Knife. The patient was under the impression Dr. Graciela Husbands was going to follow every thing for her. I advised her that he had forwarded a message to Dr. Mayford Knife after speaking with her on 2/27 to please follow up with the patient's BP management.

## 2017-01-23 NOTE — Telephone Encounter (Signed)
This encounter was created in error - please disregard.

## 2017-01-23 NOTE — Telephone Encounter (Signed)
New message ° ° °Pt returning your phone call. °

## 2017-01-23 NOTE — Telephone Encounter (Signed)
New message     Please call regarding schedule appt with [facility]

## 2017-01-23 NOTE — Telephone Encounter (Signed)
Confirmed with patient she restarted amlodipine 2.5 mg daily. Scheduled patient 3/14 in the HTN Clinic. She agrees with treatment plan.

## 2017-02-05 ENCOUNTER — Ambulatory Visit (INDEPENDENT_AMBULATORY_CARE_PROVIDER_SITE_OTHER): Payer: Medicare Other | Admitting: Pharmacist

## 2017-02-05 VITALS — BP 170/80 | HR 85

## 2017-02-05 DIAGNOSIS — I10 Essential (primary) hypertension: Secondary | ICD-10-CM

## 2017-02-05 MED ORDER — LISINOPRIL 20 MG PO TABS: 20.0000 mg | ORAL_TABLET | Freq: Every day | ORAL | 0 refills | Status: DC

## 2017-02-05 NOTE — Progress Notes (Signed)
Patient ID: Emily Page                 DOB: 1951/01/19                      MRN: [id number]     HPI: Emily Page is a 66 y.o. female patient of Dr. Mayford Knife who presents today for hypertension evaluation. PMH includes PVC's, noncardiac CP with normal coronary arteries on cath 07/2014 and HTN. She had sleep study that showed she had mild OSA. She was seen by Dr. Graciela Husbands in Jan 2018 for dysautonomia related to orthostatic intolerance. At that visit she was started on an ACEi and her amlodipine was stopped due to swelling. In the interim, she had elevated blood pressures in the 150-170s, and she called the clinic. Dr. Graciela Husbands restarted amlodipine at 2.5 mg daily. She is not having problems with swelling at this time.  Patient presents today altogether well, but somewhat worried about her elevated pressures. She spends a lot of time seeing her grandchildren and taking care of them.   She complains of headaches when she has significantly elevated blood pressures in the 170-180s. She described feeling "pressure" in her head today.   Current HTN meds:  Lisinopril 10mg  daily (evening) Amlodipine 2.5mg  daily (evening)  Previously tried:  Higher doses of amlodipine - swelling  BP goal: <130/80  Family History: Father - Heart attack, HLD, HTN; Mother - HTN  Social History: Denies tobacco products. She endorses 1 serving/day of alcohol.  Diet: Notes that historically, she would cook more, but lately she has been out of town frequently, visiting family. She has ~2 cups of coffee daily, but doesn't typically drink caffeine throughout the day.    Exercise: She of [employer] and will occasionally go and spend some time on the machines. If she doesn't, she walks, 10-30 minutes.   Home BP Readings: She typically takes when she has a headache, and gets 170s-180s. She said when she does check at other times, she sees lower numbers.   Wt Readings from Last 3 Encounters:  12/17/16 134 lb 9.6 oz (61.1 kg)    10/28/16 132 lb (59.9 kg)  04/24/16 135 lb (61.2 kg)   BP Readings from Last 3 Encounters:  02/05/17 (!) 170/80  12/17/16 (!) 157/78  10/28/16 126/78   Pulse Readings from Last 3 Encounters:  02/05/17 85  12/17/16 95  10/28/16 77    Renal function: CrCl cannot be calculated (Patient's most recent lab result is older than the maximum 21 days allowed.).  Past Medical History:  Diagnosis Date  . Allergic rhinitis    followed by Dr Willa Rough in the past  . Fibromyalgia    followed by Dr Bedelia Person  . Foot pain, right    w intermittent swelling felt to be related to how she is walking on her foot. evaluated by Dr Leticia Penna (podiatrist)  . GERD (gastroesophageal reflux disease)    Followed by Dr Randa Evens in the past  . 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 History of EKG    w palpitations and nonspecific T-wave changes, stress cardiolite 6/08 showed normal LV size in a systolic function,no maximum effort stress test with moderate work load and good HR achieved,no EKG criteria of ischemia. Did have occassional PVC's  . Hypertension   . Migraines    [facility].   . OSA (obstructive sleep apnea) 10/28/2016   Very  mild  . PVC (premature ventricular contraction)   . Raynaud's disease    followed by Dr Corliss Skains and norvasc has helped  . Rosacea    and seborrheic dermatitis followed by Dr Levy Sjogren    Current Outpatient Prescriptions on File Prior to Visit  Medication Sig Dispense Refill  . amLODipine (NORVASC) 2.5 MG tablet Take 1 tablet (2.5 mg total) by mouth daily.    Marland Kitchen aspirin EC 81 MG tablet Take 81 mg by mouth every evening.    Marland Kitchen azelastine (ASTELIN) 137 MCG/SPRAY nasal spray Place 2 sprays into both nostrils daily as needed for rhinitis or allergies. Use in each nostril as directed    . cetirizine (ZYRTEC) 10 MG tablet Take 10 mg by mouth daily.    . Cholecalciferol 2000 UNITS TABS Take 2,000 Units by mouth every morning.     . cyclobenzaprine (FLEXERIL) 10 MG tablet Take 1 tablet (10 mg total) by mouth at bedtime. 90 tablet 1  . diclofenac sodium (VOLTAREN) 1 % GEL Apply 2 g topically daily as needed (for pain).     Marland Kitchen esomeprazole (NEXIUM) 40 MG capsule Take 40 mg by mouth daily at 12 noon.    Marland Kitchen estradiol (ESTRACE) 2 MG tablet Take 2 mg by mouth daily.    . fluconazole (DIFLUCAN) 150 MG tablet Take 150 mg by mouth daily as needed (for yeast infection).     . fluticasone (FLONASE) 50 MCG/ACT nasal spray Place 1 spray into both nostrils as needed.     Marland Kitchen ketoconazole (NIZORAL) 2 % shampoo Apply 1 application topically daily.     Marland Kitchen lisinopril (PRINIVIL,ZESTRIL) 10 MG tablet Take 1 tablet (10 mg total) by mouth daily. 90 tablet 3  . Magnesium Malate 1250 (141.7 Mg) MG TABS Take 2 tablets by mouth daily.    Marland Kitchen nystatin-triamcinolone ointment (MYCOLOG) Apply 1 application topically daily as needed (to rash).     . RESTASIS 0.05 % ophthalmic emulsion Place 1 drop into both eyes 2 (two) times daily.      No current facility-administered medications on file prior to visit.     Allergies  Allergen Reactions  . Amoxil [Amoxicillin]     Felt like throat was swelling  . Penicillins     Swelling of throat  . Tape     Electrodes cause Blisters, even with the sensitive kind   . Sulfa Antibiotics     Swelling of eyes  . Zanaflex [Tizanidine]     halluciations  . Polytrim [Polymyxin B-Trimethoprim]     unknown    Blood pressure (!) 170/80, pulse 85, SpO2 98 %.   Assessment/Plan: Hypertension: Patient's blood pressure is uncontrolled and greater than goal of <130/80.  1. Increase lisinopril to 20 mg daily. Continue amlodipine 2.5 mg daily. Patient will change to taking lisinopril in the morning, and continue her amlodipine at night. Follow up appointment in 3-4 weeks to check renal function and electrolytes.   Patient was seen with Caroline More, PharmD Candidate 2018.   Thank you, Freddie Apley. Cleatis Polka, PharmD  [facility]  [facility]  02/05/2017 10:55 AM

## 2017-02-05 NOTE — Patient Instructions (Signed)
Return for a a follow up appointment in 3-4 weeks  Your blood pressure today is 170/80  Check your blood pressure at home daily (if able) and keep record of the readings.  Take your BP meds as follows: INCREASE to lisinopril 20 mg daily. You can take 2 of the lisinopril 10 mg tablets until you run out.  CONTINUE amlodipine 2.5 mg daily.   Bring all of your meds, your BP cuff and your record of home blood pressures to your next appointment.  Exercise as you're able, try to walk approximately 30 minutes per day.  Keep salt intake to a minimum, especially watch canned and prepared boxed foods.  Eat more fresh fruits and vegetables and fewer canned items.  Avoid eating in fast food restaurants.    HOW TO TAKE YOUR BLOOD PRESSURE: . Rest 5 minutes before taking your blood pressure. .  Don't smoke or drink caffeinated beverages for at least 30 minutes before. . Take your blood pressure before (not after) you eat. . Sit comfortably with your back supported and both feet on the floor (don't cross your legs). . Elevate your arm to heart level on a table or a desk. . Use the proper sized cuff. It should fit smoothly and snugly around your bare upper arm. There should be enough room to slip a fingertip under the cuff. The bottom edge of the cuff should be 1 inch above the crease of the elbow. . Ideally, take 3 measurements at one sitting and record the average.

## 2017-03-06 DIAGNOSIS — M797 Fibromyalgia: Secondary | ICD-10-CM | POA: Insufficient documentation

## 2017-03-06 DIAGNOSIS — Z8679 Personal history of other diseases of the circulatory system: Secondary | ICD-10-CM | POA: Insufficient documentation

## 2017-03-06 DIAGNOSIS — E559 Vitamin D deficiency, unspecified: Secondary | ICD-10-CM | POA: Insufficient documentation

## 2017-03-06 DIAGNOSIS — M3500 Sicca syndrome, unspecified: Secondary | ICD-10-CM | POA: Insufficient documentation

## 2017-03-06 DIAGNOSIS — I7300 Raynaud's syndrome without gangrene: Secondary | ICD-10-CM | POA: Insufficient documentation

## 2017-03-06 DIAGNOSIS — H57059 Tonic pupil, unspecified eye: Secondary | ICD-10-CM | POA: Insufficient documentation

## 2017-03-06 DIAGNOSIS — Z96641 Presence of right artificial hip joint: Secondary | ICD-10-CM | POA: Insufficient documentation

## 2017-03-06 DIAGNOSIS — Z8669 Personal history of other diseases of the nervous system and sense organs: Secondary | ICD-10-CM | POA: Insufficient documentation

## 2017-03-06 DIAGNOSIS — R55 Syncope and collapse: Secondary | ICD-10-CM | POA: Insufficient documentation

## 2017-03-06 NOTE — Progress Notes (Deleted)
Office Visit Note  Patient: Emily Page             Date of Birth: 05/19/1951           MRN: [id number]             PCP: Gaye Alken, MD Referring: Juluis Rainier, MD Visit Date: 03/11/2017 Occupation: @GUAROCC @    Subjective:  No chief complaint on file.   History of Present Illness: KEESHA PELLUM is a 66 y.o. female ***   Activities of Daily Living:  Patient reports morning stiffness for *** {minute/hour:19697}.   Patient {ACTIONS;DENIES/REPORTS:21021675::"Denies"} nocturnal pain.  Difficulty dressing/grooming: {ACTIONS;DENIES/REPORTS:21021675::"Denies"} Difficulty climbing stairs: {ACTIONS;DENIES/REPORTS:21021675::"Denies"} Difficulty getting out of chair: {ACTIONS;DENIES/REPORTS:21021675::"Denies"} Difficulty using hands for taps, buttons, cutlery, and/or writing: {ACTIONS;DENIES/REPORTS:21021675::"Denies"}   No Rheumatology ROS completed.   PMFS History:  Patient Active Problem List   Diagnosis Date Noted  . Fibromyalgia 03/06/2017  . Sicca (HCC) 03/06/2017  . History of Raynaud's syndrome 03/06/2017  . History of total hip replacement, right 03/06/2017  . Vitamin D deficiency 03/06/2017  . History of migraine 03/06/2017  . Vasovagal syncope 03/06/2017  . OSA (obstructive sleep apnea) 10/28/2016  . Excessive daytime sleepiness 04/22/2016  . Exertional shortness of breath 07/14/2014  . Palpitations 07/14/2014  . Abnormal nuclear stress test 07/14/2014  . Hay fever 04/14/2014  . Allergic rhinitis 04/14/2014  . Cervical pain 04/14/2014  . Fatigue 04/14/2014  . Acid reflux 04/14/2014  . Current drug use 04/14/2014  . Atypical migraine 04/14/2014  . Arthralgia of hand 04/14/2014  . Arthralgia of multiple joints 04/14/2014  . Paroxysmal digital cyanosis 04/14/2014  . Hypertension   . Chest pain, atypical 03/31/2014  . PVC (premature ventricular contraction) 03/31/2014  . Blood glucose elevated 02/02/2014    Past Medical History:    Diagnosis Date  . Allergic rhinitis    followed by Dr Willa Rough in the past  . Fibromyalgia    followed by Dr Bedelia Person  . Foot pain, right    w intermittent swelling felt to be related to how she is walking on her foot. evaluated by Dr Leticia Penna (podiatrist)  . GERD (gastroesophageal reflux disease)    Followed by Dr Randa Evens in the past  . 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 History of EKG    w palpitations and nonspecific T-wave changes, stress cardiolite 6/08 showed normal LV size in a systolic function,no maximum effort stress test with moderate work load and good HR achieved,no EKG criteria of ischemia. Did have occassional PVC's  . Hypertension   . Migraines    followed [facility] in past.   . OSA (obstructive sleep apnea) 10/28/2016   Very mild  . PVC (premature ventricular contraction)   . Raynaud's disease    followed by Dr Corliss Skains and norvasc has helped  . Rosacea    and seborrheic dermatitis followed by Dr Levy Sjogren    Family History  Problem Relation Age of Onset  . Hypertension Mother   . Heart attack Father   . Hypertension Father   . Hyperlipidemia Father    Past Surgical History:  Procedure Laterality Date  . CARDIAC CATHETERIZATION    . LEFT HEART CATHETERIZATION WITH CORONARY ANGIOGRAM N/A 07/28/2014   Procedure: LEFT HEART CATHETERIZATION WITH CORONARY ANGIOGRAM;  Surgeon: Marykay Lex, MD;  [facility] C[facility] LAB;  Service: Cardiovascular;  Laterality: N/A;  . SPLIT NIGHT STUDY  04/10/2016   Social History   Social History  Narrative  . No narrative on file     Objective: Vital Signs: There were no vitals taken for this visit.   Physical Exam   Musculoskeletal Exam: ***  CDAI Exam: No CDAI exam completed.    Investigation: Findings:  Bone densities through Dr. Jennette Kettle  Lab on 01/01/2017  Component Date Value Ref Range Status  . Glucose 01/01/2017 97  65 - 99 mg/dL Final  . BUN 50/35/4656 11  8 - 27  mg/dL Final  . Creatinine, Ser 01/01/2017 0.76  0.57 - 1.00 mg/dL Final  . GFR calc non Af Amer 01/01/2017 82  >59 mL/min/1.73 Final  . GFR calc Af Amer 01/01/2017 95  >59 mL/min/1.73 Final  . BUN/Creatinine Ratio 01/01/2017 14  12 - 28 Final  . Sodium 01/01/2017 141  134 - 144 mmol/L Final  . Potassium 01/01/2017 4.8  3.5 - 5.2 mmol/L Final  . Chloride 01/01/2017 103  96 - 106 mmol/L Final  . CO2 01/01/2017 23  18 - 29 mmol/L Final  . Calcium 01/01/2017 8.6* 8.7 - 10.3 mg/dL Final      Imaging: No results found.  Speciality Comments: No specialty comments available.    Procedures:  No procedures performed Allergies: Amoxil [amoxicillin]; Penicillins; Tape; Sulfa antibiotics; Zanaflex [tizanidine]; and Polytrim [polymyxin b-trimethoprim]   Assessment / Plan:     Visit Diagnoses: Fibromyalgia  Sicca, unspecified type (HCC)  History of Raynaud's syndrome  History of total hip replacement, right  Vitamin D deficiency  Essential hypertension  History of migraine  Vasovagal syncope    Orders: No orders of the defined types were placed in this encounter.  No orders of the defined types were placed in this encounter.   Face-to-face time spent with patient was *** minutes. 50% of time was spent in counseling and coordination of care.  Follow-Up Instructions: No Follow-up on file.   Dalilah Curlin, [place]  Note - This record has been created using [employer].  Chart creation errors have been sought, but may not always  have been located. Such creation errors do not reflect on  the standard of medical care.

## 2017-03-07 ENCOUNTER — Other Ambulatory Visit: Payer: Medicare Other | Admitting: *Deleted

## 2017-03-07 ENCOUNTER — Ambulatory Visit (INDEPENDENT_AMBULATORY_CARE_PROVIDER_SITE_OTHER): Payer: Medicare Other | Admitting: Pharmacist

## 2017-03-07 VITALS — BP 140/82 | HR 76

## 2017-03-07 DIAGNOSIS — I10 Essential (primary) hypertension: Secondary | ICD-10-CM | POA: Diagnosis not present

## 2017-03-07 LAB — BASIC METABOLIC PANEL
BUN / CREAT RATIO: 12 (ref 12–28)
BUN: 10 mg/dL (ref 8–27)
CHLORIDE: 99 mmol/L (ref 96–106)
CO2: 23 mmol/L (ref 18–29)
Calcium: 8.9 mg/dL (ref 8.7–10.3)
Creatinine, Ser: 0.81 mg/dL (ref 0.57–1.00)
GFR calc Af Amer: 88 mL/min/{1.73_m2} (ref >59)
GFR calc non Af Amer: 76 mL/min/{1.73_m2} (ref >59)
GLUCOSE: 103 mg/dL — AB (ref 65–99)
Potassium: 4.1 mmol/L (ref 3.5–5.2)
SODIUM: 138 mmol/L (ref 134–144)

## 2017-03-07 MED ORDER — LISINOPRIL 10 MG PO TABS: 30.0000 mg | ORAL_TABLET | Freq: Every day | ORAL | 1 refills | Status: DC

## 2017-03-07 NOTE — Progress Notes (Signed)
Patient ID: Emily Page                 DOB: 04-Mar-1951                      MRN: [id number]     HPI: Emily Page is a 66 y.o. female patient of Dr. Mayford Knife who presents today for hypertension follow up. PMH includes PVC's, non-cardiac CP with normal coronary arteries on cath 07/2014 and HTN. She had sleep study that showed she had mild OSA. She was seen by Dr. Graciela Husbands in Jan 2018 for dysautonomia related to orthostatic intolerance.  At that visit she was started on an ACEi and her amlodipine was stopped due to swelling. In the interim, she had elevated blood pressures in the 150-170s, and she called the clinic. Dr. Graciela Husbands restarted amlodipine at 2.5 mg daily. She is having minimal problems with swelling at this time. Patient presents today altogether well, spends a lot of time seeing her grandchildren and taking care of them.    Patient reports no more daily or extreme headaches. Sometime wakes up with HA but nothing like past symptoms. Traveling a lot and unable to monitor her BP for 2-3 weeks now.  Current HTN meds:  Lisinopril 20mg  daily (morning) Amlodipine 2.5mg  daily (evening)  Previously tried:  amlodipine 5mg  - lower extremities swelling (patient statement - Dr Graciela Husbands recommendation not to use diuretic EVER)  BP goal: <130/80  Family History: Father - Heart attack, HLD, HTN; Mother - HTN  Social History: Denies tobacco products. She endorses 1 serving/day of alcohol.  Diet: Notes that historically, she would cook more, but lately she has been out of town frequently, visiting family. She has ~2 cups of coffee daily, but doesn't typically drink caffeine throughout the day.    Exercise: She of [employer] and will occasionally go and spend some time on the machines. If she doesn't, she walks, 10-30 minutes.    Home BP readings: none available - no readings in the last 2 - 3 weeks  Wt Readings from Last 3 Encounters:  12/17/16 134 lb 9.6 oz (61.1 kg)  10/28/16 132 lb (59.9  kg)  04/24/16 135 lb (61.2 kg)   BP Readings from Last 3 Encounters:  03/07/17 140/82  02/05/17 (!) 170/80  12/17/16 (!) 157/78   Pulse Readings from Last 3 Encounters:  03/07/17 76  02/05/17 85  12/17/16 95    Past Medical History:  Diagnosis Date  . Allergic rhinitis    followed by Dr Willa Rough in the past  . Fibromyalgia    followed by Dr Bedelia Person  . Foot pain, right    w intermittent swelling felt to be related to how she is walking on her foot. evaluated by Dr Leticia Penna (podiatrist)  . GERD (gastroesophageal reflux disease)    Followed by Dr Randa Evens in the past  . 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 History of EKG    w palpitations and nonspecific T-wave changes, stress cardiolite 6/08 showed normal LV size in a systolic function,no maximum effort stress test with moderate work load and good HR achieved,no EKG criteria of ischemia. Did have occassional PVC's  . Hypertension   . Migraines    follow[facility] past.   . OSA (obstructive sleep apnea) 10/28/2016   Very mild  . PVC (premature ventricular contraction)   . Raynaud's disease    followed by Dr Corliss Skains and  norvasc has helped  . Rosacea    and seborrheic dermatitis followed by Dr Levy Sjogren    Current Outpatient Prescriptions on File Prior to Visit  Medication Sig Dispense Refill  . amLODipine (NORVASC) 2.5 MG tablet Take 1 tablet (2.5 mg total) by mouth daily.    Marland Kitchen aspirin EC 81 MG tablet Take 81 mg by mouth every evening.    Marland Kitchen azelastine (ASTELIN) 137 MCG/SPRAY nasal spray Place 2 sprays into both nostrils daily as needed for rhinitis or allergies. Use in each nostril as directed    . cetirizine (ZYRTEC) 10 MG tablet Take 10 mg by mouth daily.    . Cholecalciferol 2000 UNITS TABS Take 2,000 Units by mouth every morning.    . cyclobenzaprine (FLEXERIL) 10 MG tablet Take 1 tablet (10 mg total) by mouth at bedtime. 90 tablet 1  . diclofenac sodium (VOLTAREN) 1 % GEL  Apply 2 g topically daily as needed (for pain).     Marland Kitchen esomeprazole (NEXIUM) 40 MG capsule Take 40 mg by mouth daily at 12 noon.    Marland Kitchen estradiol (ESTRACE) 2 MG tablet Take 2 mg by mouth daily.    . fluconazole (DIFLUCAN) 150 MG tablet Take 150 mg by mouth daily as needed (for yeast infection).     . fluticasone (FLONASE) 50 MCG/ACT nasal spray Place 1 spray into both nostrils as needed.     Marland Kitchen ketoconazole (NIZORAL) 2 % shampoo Apply 1 application topically daily.     . Magnesium Malate 1250 (141.7 Mg) MG TABS Take 2 tablets by mouth daily.    Marland Kitchen nystatin-triamcinolone ointment (MYCOLOG) Apply 1 application topically daily as needed (to rash).     . RESTASIS 0.05 % ophthalmic emulsion Place 1 drop into both eyes 2 (two) times daily.      No current facility-administered medications on file prior to visit.     Allergies  Allergen Reactions  . Amoxil [Amoxicillin]     Felt like throat was swelling  . Penicillins     Swelling of throat  . Tape     Electrodes cause Blisters, even with the sensitive kind   . Sulfa Antibiotics     Swelling of eyes  . Zanaflex [Tizanidine]     halluciations  . Polytrim [Polymyxin B-Trimethoprim]     unknown    Blood pressure 140/82, pulse 76, SpO2 98 %.  Essential hypertension:   Blood pressure today remains above goal of 130/80, but greatly improved from previous readings.  Recurrent headaches  resolved and patient is feeling better in general. Reports minimal swelling of low extremities that resolves during the night.  No other ADRs or intolerance to therapy noted.  BMET was done today before appointment.  Will increase Lisinopril to 30mg  daily (instructed to wait until BMET resulted to assure stable renal function), and continue amlodipine 2.5mg  every evening.  Follow up at [facility] in 4 weeks.  Zedrick Springsteen Rodriguez-Guzman PharmD, BCPS [facility] [address] [place] [address] 03/07/2017 1:04 PM

## 2017-03-07 NOTE — Patient Instructions (Addendum)
Return for a a follow up appointment in 4 weeks  Your blood pressure today is 140/82 pulse 76  Check your blood pressure at home daily (if able) and keep record of the readings.  Take your BP meds as follows: **Increase lisinopril dose to 30mg  daily** (wait for blood work results avalable) **Continue amlodipine 2.5mg  every evening**  Bring  your BP record of home blood pressures to your next appointment.  Exercise as you're able, try to walk approximately 30 minutes per day.  Keep salt intake to a minimum, especially watch canned and prepared boxed foods.  Eat more fresh fruits and vegetables and fewer canned items.  Avoid eating in fast food restaurants.    HOW TO TAKE YOUR BLOOD PRESSURE: . Rest 5 minutes before taking your blood pressure. .  Don't smoke or drink caffeinated beverages for at least 30 minutes before. . Take your blood pressure before (not after) you eat. . Sit comfortably with your back supported and both feet on the floor (don't cross your legs). . Elevate your arm to heart level on a table or a desk. . Use the proper sized cuff. It should fit smoothly and snugly around your bare upper arm. There should be enough room to slip a fingertip under the cuff. The bottom edge of the cuff should be 1 inch above the crease of the elbow. . Ideally, take 3 measurements at one sitting and record the average.

## 2017-03-11 ENCOUNTER — Ambulatory Visit: Payer: Medicare Other | Admitting: Rheumatology

## 2017-03-26 ENCOUNTER — Ambulatory Visit: Payer: Medicare Other | Admitting: Rheumatology

## 2017-04-02 ENCOUNTER — Encounter: Payer: Self-pay | Admitting: Internal Medicine

## 2017-04-04 ENCOUNTER — Ambulatory Visit (INDEPENDENT_AMBULATORY_CARE_PROVIDER_SITE_OTHER): Payer: Medicare Other | Admitting: Pharmacist

## 2017-04-04 ENCOUNTER — Encounter: Payer: Self-pay | Admitting: Pharmacist

## 2017-04-04 VITALS — BP 132/78 | HR 72

## 2017-04-04 DIAGNOSIS — I10 Essential (primary) hypertension: Secondary | ICD-10-CM | POA: Diagnosis not present

## 2017-04-04 MED ORDER — LISINOPRIL 40 MG PO TABS: 40.0000 mg | ORAL_TABLET | Freq: Every day | ORAL | 3 refills | Status: DC

## 2017-04-04 NOTE — Progress Notes (Signed)
Patient ID: Emily Page                 DOB: [birth year]                      MRN: [id number]     HPI: Emily Page is a 66 y.o. female patient of Dr. Mayford Knife who presents today for hypertension follow up. PMH includes PVCs, non-cardiac CP with normal coronary arteries on cath 07/2014 and HTN. She had sleep study that showed mild OSA. She was seen by Dr. Graciela Husbands in Jan 2018 for dysautonomia related to orthostatic intolerance.  At that visit she was started on an ACEi and her amlodipine was stopped due to swelling. In the interim, she had elevated blood pressures in the 150-170s, and she called the clinic. Dr. Graciela Husbands restarted amlodipine at 2.5 mg daily. She is having minimal problems with swelling at this time. Historically she has had issues with headaches, most of the time them being related to high BP readings.   At last visit in HTN clinic on 03/07/2017, BP was still elevated, though much improved, and lisinopril dose was increased from 20mg  to 30mg  daily. Patient presents today altogether well. States that she has been adherent to all HTN medications and hasn't noticed much of a difference from the dose change last month except for less headaches. She denied any lightheadedness, minimal dizziness with positional changes (improved from what she felt before), or near syncope. States that she has always had issues with fatigue, and did voice concern over some high heart rates that she can feel around 2-3 in the morning. She wears a fit bit and can see that these are in the low 100s. Pt has been traveling a lot recently to spend time with grandchildren.  Current HTN meds:  Lisinopril 30mg  daily (morning) Amlodipine 2.5mg  daily (evening)  Previously tried:  Higher doses of amlodipine - swelling  BP goal: <130/31mmHg  Family History: Father - Heart attack, HLD, HTN; Mother - HTN  Social History: Denies tobacco products. She endorses 1 serving/day of alcohol.  Diet: Notes that  historically, she would cook more, but lately she has been out of town frequently, visiting family. She has ~2 cups of coffee daily, but doesn't typically drink caffeine throughout the day.    Exercise: She of [employer] and will occasionally go and spend some time on the machines. If she doesn't, she walks, 10-30 minutes. Recently has been traveling a lot to see grandkids so has not been able to exercise.  Home BP Readings: She typically takes when she has a headache - 170s-180s. She said when she does check at other times, she sees lower numbers. At home readings were 120-140s/70-80s, with one low reading at 109/38 mmHg. HR ranges 70-100s.  Wt Readings from Last 3 Encounters:  12/17/16 134 lb 9.6 oz (61.1 kg)  10/28/16 132 lb (59.9 kg)  04/24/16 135 lb (61.2 kg)   BP Readings from Last 3 Encounters:  03/07/17 140/82  02/05/17 (!) 170/80  12/17/16 (!) 157/78   Pulse Readings from Last 3 Encounters:  03/07/17 76  02/05/17 85  12/17/16 95    Renal function: CrCl cannot be calculated (Patient's most recent lab result is older than the maximum 21 days allowed.).  Past Medical History:  Diagnosis Date  . Allergic rhinitis    followed by Dr Willa Rough in the past  . Fibromyalgia    followed by Dr Bedelia Person  . Foot pain, right  w intermittent swelling felt to be related to how she is walking on her foot. evaluated by Dr Leticia Penna (podiatrist)  . GERD (gastroesophageal reflux disease)    Followed by Dr Randa Evens in the past  . 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 History of EKG    w palpitations and nonspecific T-wave changes, stress cardiolite 6/08 showed normal LV size in a systolic function,no maximum effort stress test with moderate work load and good HR achieved,no EKG criteria of ischemia. Did have occassional PVC's  . Hypertension   . Migraines    [facility].   . OSA (obstructive sleep apnea) 10/28/2016   Very mild  . PVC  (premature ventricular contraction)   . Raynaud's disease    followed by Dr Corliss Skains and norvasc has helped  . Rosacea    and seborrheic dermatitis followed by Dr Levy Sjogren    Current Outpatient Prescriptions on File Prior to Visit  Medication Sig Dispense Refill  . amLODipine (NORVASC) 2.5 MG tablet Take 1 tablet (2.5 mg total) by mouth daily.    Marland Kitchen aspirin EC 81 MG tablet Take 81 mg by mouth every evening.    Marland Kitchen azelastine (ASTELIN) 137 MCG/SPRAY nasal spray Place 2 sprays into both nostrils daily as needed for rhinitis or allergies. Use in each nostril as directed    . cetirizine (ZYRTEC) 10 MG tablet Take 10 mg by mouth daily.    . Cholecalciferol 2000 UNITS TABS Take 2,000 Units by mouth every morning.    . cyclobenzaprine (FLEXERIL) 10 MG tablet Take 1 tablet (10 mg total) by mouth at bedtime. 90 tablet 1  . diclofenac sodium (VOLTAREN) 1 % GEL Apply 2 g topically daily as needed (for pain).     Marland Kitchen esomeprazole (NEXIUM) 40 MG capsule Take 40 mg by mouth daily at 12 noon.    Marland Kitchen estradiol (ESTRACE) 2 MG tablet Take 2 mg by mouth daily.    . fluconazole (DIFLUCAN) 150 MG tablet Take 150 mg by mouth daily as needed (for yeast infection).     . fluticasone (FLONASE) 50 MCG/ACT nasal spray Place 1 spray into both nostrils as needed.     Marland Kitchen ketoconazole (NIZORAL) 2 % shampoo Apply 1 application topically daily.     Marland Kitchen lisinopril (PRINIVIL,ZESTRIL) 10 MG tablet Take 3 tablets (30 mg total) by mouth daily. 90 tablet 1  . Magnesium Malate 1250 (141.7 Mg) MG TABS Take 2 tablets by mouth daily.    Marland Kitchen nystatin-triamcinolone ointment (MYCOLOG) Apply 1 application topically daily as needed (to rash).     . RESTASIS 0.05 % ophthalmic emulsion Place 1 drop into both eyes 2 (two) times daily.      No current facility-administered medications on file prior to visit.     Allergies  Allergen Reactions  . Amoxil [Amoxicillin]     Felt like throat was swelling  . Penicillins     Swelling of throat  . Tape       Electrodes cause Blisters, even with the sensitive kind   . Sulfa Antibiotics     Swelling of eyes  . Zanaflex [Tizanidine]     halluciations  . Polytrim [Polymyxin B-Trimethoprim]     unknown    Assessment/Plan:  1. Hypertension - pts BP still slightly above goal of < 130/80 mmHg. Pt stated that she was feeling better than she has in a while. Tolerating current HTN medications well. Will increase lisinopril to 40mg   daily since many home BP readings are still above goal. Asked pt to bring in blood pressure cuff to next visit to make sure that it is accurate. Pt has follow up with Dr. Graciela Husbands on 04/24/2017, will follow up in HTN clinic and repeat BMET at that time.  Sherron Monday, PharmD Clinical Pharmacy Resident 04/04/17 2:21 PM   Megan E. Supple, PharmD, CPP, BCACP [facility] [address], [place], [place] [address] Phone: [phone]; Fax: [phone]

## 2017-04-04 NOTE — Patient Instructions (Addendum)
It was great to see you today.  Please increase your lisinopril to 40mg  daily.  Continue taking the amlodipine 2.5mg  daily.  Continue to check your BP at home, and bring your cuff in at the next visit so that we can make sure that it is accurate.

## 2017-04-24 ENCOUNTER — Ambulatory Visit (INDEPENDENT_AMBULATORY_CARE_PROVIDER_SITE_OTHER): Payer: Medicare Other | Admitting: Internal Medicine

## 2017-04-24 ENCOUNTER — Ambulatory Visit (INDEPENDENT_AMBULATORY_CARE_PROVIDER_SITE_OTHER): Payer: Medicare Other | Admitting: Pharmacist

## 2017-04-24 ENCOUNTER — Ambulatory Visit (INDEPENDENT_AMBULATORY_CARE_PROVIDER_SITE_OTHER): Payer: Medicare Other

## 2017-04-24 ENCOUNTER — Encounter: Payer: Self-pay | Admitting: Pharmacist

## 2017-04-24 ENCOUNTER — Encounter: Payer: Self-pay | Admitting: Internal Medicine

## 2017-04-24 ENCOUNTER — Other Ambulatory Visit: Payer: Medicare Other

## 2017-04-24 VITALS — BP 150/80 | HR 65 | Ht 62.0 in | Wt 135.0 lb

## 2017-04-24 VITALS — BP 142/72 | HR 70

## 2017-04-24 DIAGNOSIS — I10 Essential (primary) hypertension: Secondary | ICD-10-CM | POA: Diagnosis not present

## 2017-04-24 DIAGNOSIS — I493 Ventricular premature depolarization: Secondary | ICD-10-CM

## 2017-04-24 DIAGNOSIS — I429 Cardiomyopathy, unspecified: Secondary | ICD-10-CM | POA: Diagnosis not present

## 2017-04-24 DIAGNOSIS — G901 Familial dysautonomia [Riley-Day]: Secondary | ICD-10-CM

## 2017-04-24 DIAGNOSIS — I428 Other cardiomyopathies: Secondary | ICD-10-CM

## 2017-04-24 DIAGNOSIS — G909 Disorder of the autonomic nervous system, unspecified: Secondary | ICD-10-CM

## 2017-04-24 MED ORDER — METOPROLOL TARTRATE 25 MG PO TABS: 25.0000 mg | ORAL_TABLET | Freq: Two times a day (BID) | ORAL | 3 refills | Status: DC

## 2017-04-24 NOTE — Patient Instructions (Signed)
Medication Instructions: Your physician has recommended you make the following change in your medication:  -1) STOP Amlodipine (Norvasc)  -2) START Metoprolol Tartrate - Take 1 tablet (25 mg) by mouth twice daily   Labwork: None Ordered  Procedures/Testing: -Your physician has requested that you have an echocardiogram. Echocardiography is a painless test that uses sound waves to create images of your heart. It provides your doctor with information about the size and shape of your heart and how well your heart's chambers and valves are working. This procedure takes approximately one hour. There are no restrictions for this procedure.  -Your physician has recommended that you wear a 48 hour holter monitor. Holter monitors are medical devices that record the heart's electrical activity. Doctors most often use these monitors to diagnose arrhythmias. Arrhythmias are problems with the speed or rhythm of the heartbeat. The monitor is a small, portable device. You can wear one while you do your normal daily activities. This is usually used to diagnose what is causing palpitations/syncope (passing out).   Follow-Up: Your physician wants you to follow-up in: 6 MONTHS with Dr. Graciela Husbands. You will receive a reminder letter in the mail two months in advance. If you don't receive a letter, please call our office to schedule the follow-up appointment.   Any Additional Special Instructions Will Be Listed Below (If Applicable).     If you need a refill on your cardiac medications before your next appointment, please call your pharmacy.

## 2017-04-24 NOTE — Progress Notes (Signed)
Patient ID: Emily Page                 DOB: November 13, 1951                      MRN: [id number]     HPI: Emily Page is a 66 y.o. female patient of Dr. Mayford Knife who presents today for hypertension follow up. PMH includes PVCs, non-cardiac CP with normal coronary arteries on cath 07/2014 and HTN. She had sleep study that showed mild OSA. Historically she has had issues with headaches, most of the time them being related to high BP readings. She was seen by Dr. Graciela Husbands in Jan 2018 for dysautonomia related to orthostatic intolerance. At that visit she was started on an ACEi and her amlodipine was stopped due to swelling. Since that time pt has followed up in HTN clinic where amlodipine was restarted at 2.5mg  (no issues with swelling at follow up visits) and lisinopril was titrated up to 40mg  daily.  Pt presents after seeing Dr. Graciela Husbands. Pt now reports minimal swelling with 2.5mg  dose and plans to schedule 48 hour holter monitor. Per Dr. Graciela Husbands d/c amlodipine and start Toprol XL 25mg  after her holter monitor. Overall she had no complaints, and presents with home cuff and print out of home readings. At home most readings were 120-150s with variable HR anywhere from 40-100, confirmed in clinic as well. Home BP cuff takes 3 readings and averages them. During visit, pt took her BP and there was at least 20 point variability between these.  Current HTN meds: Lisinopril 40mg  daily (morning) Amlodipine 2.5mg  daily (evening) - discontinued at prior appointment with Dr Graciela Husbands today  Previously tried: Amlodipine 2.5-10mg  daily - lower extremity edema  BP goal: <130/62mmHg  Family History: Father - Heart attack, HLD, HTN; Mother - HTN  Social History: Denies tobacco products. She endorses 1 serving/day of alcohol.  Diet:Notes that historically, she would cook more, but lately she has been out of town frequently, visiting family. She has ~2 cups of coffee daily, but doesn't typically drink  caffeine throughout the day.   Exercise:She goes to the [facility] and will occasionally go and spend some time on the machines. If she doesn't, she walks for 10-30 minutes. Recently has been traveling a lot to see grandkids so has not been able to exercise.  Home BP Readings: At home readings were 120-150s/70-80s, with one low reading at 110/69 mmHg and highest being 154/86. HR ranges 40-100s.     Wt Readings from Last 3 Encounters:  12/17/16 134 lb 9.6 oz (61.1 kg)  10/28/16 132 lb (59.9 kg)  04/24/16 135 lb (61.2 kg)      BP Readings from Last 3 Encounters:  03/07/17 140/82  02/05/17 (!) 170/80  12/17/16 (!) 157/78      Pulse Readings from Last 3 Encounters:  03/07/17 76  02/05/17 85  12/17/16 95    Renal function: CrCl cannot be calculated (Patient's most recent lab result is older than the maximum 21 days allowed.).      Past Medical History:  Diagnosis Date  . Allergic rhinitis    followed by Dr Willa Rough in the past  . Fibromyalgia    followed by Dr Bedelia Person  . Foot pain, right    w intermittent swelling felt to be related to how she is walking on her foot. evaluated by Dr Leticia Penna (podiatrist)  . GERD (gastroesophageal reflux disease)    Followed by Dr Randa Evens in the  past  . 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 History of EKG    w palpitations and nonspecific T-wave changes, stress cardiolite 6/08 showed normal LV size in a systolic function,no maximum effort stress test with moderate work load and good HR achieved,no EKG criteria of ischemia. Did have occassional PVC's  . Hypertension   . Migraines    [facility] past.   . OSA (obstructive sleep apnea) 10/28/2016   Very mild  . PVC (premature ventricular contraction)   . Raynaud's disease    followed by Dr Corliss Skains and norvasc has helped  . Rosacea    and seborrheic dermatitis followed by Dr Levy Sjogren          Current Outpatient  Prescriptions on File Prior to Visit  Medication Sig Dispense Refill  . amLODipine (NORVASC) 2.5 MG tablet Take 1 tablet (2.5 mg total) by mouth daily.    Marland Kitchen aspirin EC 81 MG tablet Take 81 mg by mouth every evening.    Marland Kitchen azelastine (ASTELIN) 137 MCG/SPRAY nasal spray Place 2 sprays into both nostrils daily as needed for rhinitis or allergies. Use in each nostril as directed    . cetirizine (ZYRTEC) 10 MG tablet Take 10 mg by mouth daily.    . Cholecalciferol 2000 UNITS TABS Take 2,000 Units by mouth every morning.    . cyclobenzaprine (FLEXERIL) 10 MG tablet Take 1 tablet (10 mg total) by mouth at bedtime. 90 tablet 1  . diclofenac sodium (VOLTAREN) 1 % GEL Apply 2 g topically daily as needed (for pain).     Marland Kitchen esomeprazole (NEXIUM) 40 MG capsule Take 40 mg by mouth daily at 12 noon.    Marland Kitchen estradiol (ESTRACE) 2 MG tablet Take 2 mg by mouth daily.    . fluconazole (DIFLUCAN) 150 MG tablet Take 150 mg by mouth daily as needed (for yeast infection).     . fluticasone (FLONASE) 50 MCG/ACT nasal spray Place 1 spray into both nostrils as needed.     Marland Kitchen ketoconazole (NIZORAL) 2 % shampoo Apply 1 application topically daily.     Marland Kitchen lisinopril (PRINIVIL,ZESTRIL) 10 MG tablet Take 3 tablets (30 mg total) by mouth daily. 90 tablet 1  . Magnesium Malate 1250 (141.7 Mg) MG TABS Take 2 tablets by mouth daily.    Marland Kitchen nystatin-triamcinolone ointment (MYCOLOG) Apply 1 application topically daily as needed (to rash).     . RESTASIS 0.05 % ophthalmic emulsion Place 1 drop into both eyes 2 (two) times daily.      No current facility-administered medications on file prior to visit.          Allergies  Allergen Reactions  . Amoxil [Amoxicillin]     Felt like throat was swelling  . Penicillins     Swelling of throat  . Tape     Electrodes cause Blisters, even with the sensitive kind   . Sulfa Antibiotics     Swelling of eyes  . Zanaflex [Tizanidine]      halluciations  . Polytrim [Polymyxin B-Trimethoprim]     unknown    Assessment/Plan:  1. Hypertension - BP remains above goal < 130/80 mmHg. Due to variability in readings and plan for holter monitoring, will defer any medication adjustments until results are reported. Pt to get BMET today for increased lisinopril dose at last visit. Will continue current medications pending BMET and follow up with patient over the phone in 3-4 weeks.  Janeann Merl  Yetta Barre, PharmD Clinical Pharmacy Resident 04/04/17 2:21 PM   Megan E. Supple, PharmD, CPP, BCACP [facility] [address], [place], [place] [address] Phone: [phone]; Fax: [phone]

## 2017-04-24 NOTE — Progress Notes (Signed)
Patient Care Team: Juluis Rainier, MD as PCP - General (Family Medicine) Juluis Rainier, MD as Referring Physician (Family Medicine)   HPI  Emily Page is a 66 y.o. female Seen in follow-up for spells suggestive of dysautonomia. They are stereotypical associated with multiple epiphenomena aggravated by heat and showers and prolonged standing. These occur not withstanding the context of an abnormal heart muscle as outlined below  She continues to complain of tachypalpitations. These are aggravated by exertion but not always so. She has tachypalpitations also when lying down and had recordings from her FitBit where her heart rates will go into the 130s while lying. She also brings in [employer] recordings associated with symptoms. They're described below.  She struggled with exercise intolerance. She has not had chest pain.   DATE TEST    8/15    Echo 45-50   9/15/     Cath   EF  % Normal CA  11/16 cMRI EF 47% No LGE     Echo 8/15:  EF 45-50% Normal RV Grade 1 DD  Cardiac cath 9/15 Normal coronaries. LVEDP = 3  cMRI 11/16: 1) Normal RV size and function with no evidence of RV dysplasia 2) Mild LVE with mild diffuse hypokinesis EF 47% 3) No delayed gadolinium enhancement or evidence of scar/infiltration  CPX 11/16 FVC 2.83 (100%)    FEV1 2.13 (94%)     FEV1/FVC 75 (96%)   Records and Results Reviewed* aliveCor tracings and Fitbit recordings Chemistries were normal 4 /18. We do not have a CBC or thyroid on record these are being [facility] at [facility].   Past Medical History:  Diagnosis Date  . Allergic rhinitis    followed by Dr Willa Rough in the past  . Fibromyalgia    followed by Dr Bedelia Person  . Foot pain, right    w intermittent swelling felt to be related to how she is walking on her foot. evaluated by Dr Leticia Penna (podiatrist)  . GERD (gastroesophageal reflux disease)    Followed by Dr Randa Evens in the past  . 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 History of EKG    w palpitations and nonspecific T-wave changes, stress cardiolite 6/08 showed normal LV size in a systolic function,no maximum effort stress test with moderate work load and good HR achieved,no EKG criteria of ischemia. Did have occassional PVC's  . Hypertension   . Migraines    [facility] past.   . OSA (obstructive sleep apnea) 10/28/2016   Very mild  . PVC (premature ventricular contraction)   . Raynaud's disease    followed by Dr Corliss Skains and norvasc has helped  . Rosacea    and seborrheic dermatitis followed by Dr Levy Sjogren    Past Surgical History:  Procedure Laterality Date  . CARDIAC CATHETERIZATION    . LEFT HEART CATHETERIZATION WITH CORONARY ANGIOGRAM N/A 07/28/2014   Procedure: LEFT HEART CATHETERIZATION WITH CORONARY ANGIOGRAM;  Surgeon: Marykay Lex, MD;  Location: [facility] CATH LAB;  Service: Cardiovascular;  Laterality: N/A;  . SPLIT NIGHT STUDY  04/10/2016    Current Outpatient Prescriptions  Medication Sig Dispense Refill  . aspirin EC 81 MG tablet Take 81 mg by mouth every evening.    Marland Kitchen azelastine (ASTELIN) 137 MCG/SPRAY nasal spray Place 2 sprays into both nostrils daily as needed for rhinitis or allergies. Use in each nostril as directed    . cetirizine (ZYRTEC) 10 MG tablet Take  10 mg by mouth daily.    . Cholecalciferol 2000 UNITS TABS Take 2,000 Units by mouth every morning.    . cyclobenzaprine (FLEXERIL) 5 MG tablet Take 0.5 tablet (2.5 mg) by mouth daily at bedtime    . diclofenac sodium (VOLTAREN) 1 % GEL Apply 2 g topically daily as needed (for pain).     Marland Kitchen esomeprazole (NEXIUM) 40 MG capsule Take 40 mg by mouth daily at 12 noon.    Marland Kitchen estradiol (ESTRACE) 2 MG tablet Take 2 mg by mouth daily.    . fluconazole (DIFLUCAN) 150 MG tablet Take 150 mg by mouth daily as needed (for yeast infection).     . fluticasone (FLONASE) 50 MCG/ACT nasal spray Place 1 spray into both nostrils as needed.     Marland Kitchen  ketoconazole (NIZORAL) 2 % cream Apply 1 application topically daily. To face    . ketoconazole (NIZORAL) 2 % shampoo Apply 1 application topically daily.     Marland Kitchen lisinopril (PRINIVIL,ZESTRIL) 40 MG tablet Take 1 tablet (40 mg total) by mouth daily. 90 tablet 3  . Magnesium Malate 1250 (141.7 Mg) MG TABS Take 2 tablets by mouth daily.    Marland Kitchen nystatin-triamcinolone ointment (MYCOLOG) Apply 1 application topically daily as needed (to rash).     . RESTASIS 0.05 % ophthalmic emulsion Place 1 drop into both eyes 2 (two) times daily.     . metoprolol tartrate (LOPRESSOR) 25 MG tablet Take 1 tablet (25 mg total) by mouth 2 (two) times daily. 180 tablet 3   No current facility-administered medications for this visit.     Allergies  Allergen Reactions  . Amoxil [Amoxicillin]     Felt like throat was swelling  . Penicillins     Swelling of throat  . Tape     Electrodes cause Blisters, even with the sensitive kind   . Sulfa Antibiotics     Swelling of eyes  . Zanaflex [Tizanidine]     halluciations  . Polytrim [Polymyxin B-Trimethoprim]     unknown      Review of Systems negative except from HPI and PMH  Physical Exam BP (!) 150/80   Pulse 65   Ht 5\' 2"  (1.575 m)   Wt 135 lb (61.2 kg)   SpO2 98%   BMI 24.69 kg/m  Well developed and well nourished in no acute distress HENT normal E scleral and icterus clear Neck Supple JVP flat; carotids brisk and full  Regular rate and rhythm, no murmurs gallops or rub Soft with active bowel sounds No clubbing cyanosis  Edema Alert and oriented, grossly normal motor and sensory function Skin Warm and Dry  ECG sinus rhythm at 76 Intervals 11/02/37 T-wave inversions V1-V4 Frequent PVCs  Assessment and  Plan Dysautonomia  Abnormal ECG  Cardiomyopathy  Hypertension  PVCs  Tachycardia  The patient's cardiomyopathy remains undiagnosed. The tracing today is notable in that there were frequent PVCs comprising 15% of the beats and  frequent junctional extrasystoles also at about 15%. This raises the question as to whether the arrhythmia is responsible for the cardiomyopathy. We will undertake a 48 hour Holter to assess burden  The combination of orthostasis and cardiomyopathy raises the specter of amyloid; her MRI in this regard was normal  She brings in [employer] tracings which are symptomatic. These occasionally show PVCs as well as is unusual I think probably junctional extrasystole. Heart rates are in the 90s primarily these are associated with the sensation of palpitations. It is not clear to  me whether it is the rate or the extrasystoles  contributing to the palpitations.  To this and we will have her try and record her heart rate on days and times that she is symptomatic and not to see if there is a distinction in rate a correlated with her symptoms are whether we wouldn't further by that it is the irregularity of the rhythm contributing to her symptoms  She also brings in her fitbit Tracing which demonstrated rapid excursions of heart rate even occurring in the supine position. This raises a concern as to whether there is a  tachyarrhythmia in addition to which we already know. Hopefully the 48 hour monitor will be elucidating.  We will plan to discontinue her amlodipine and begin her on low-dose beta blockers as she also struggled with exercise associated tachycardia.   In the past she tolerated metoprolol without difficulty.   In the event that there is any further change in her LV function, we may need to reconsider MRI scanning and/or located signal average ECG to try to understand the mechanism of her cardiomyopathy  Her prior MRI was not illuminating.    More than 50% of 45 min was spent in counseling related to the above        Current medicines are reviewed at length with the patient today .  The patient does not  have concerns regarding medicines.

## 2017-04-25 LAB — BASIC METABOLIC PANEL
BUN / CREAT RATIO: 11 — AB (ref 12–28)
BUN: 8 mg/dL (ref 8–27)
CO2: 23 mmol/L (ref 18–29)
CREATININE: 0.74 mg/dL (ref 0.57–1.00)
Calcium: 9.5 mg/dL (ref 8.7–10.3)
Chloride: 99 mmol/L (ref 96–106)
GFR calc Af Amer: 98 mL/min/{1.73_m2} (ref >59)
GFR, EST NON AFRICAN AMERICAN: 85 mL/min/{1.73_m2} (ref >59)
GLUCOSE: 108 mg/dL — AB (ref 65–99)
POTASSIUM: 4.2 mmol/L (ref 3.5–5.2)
Sodium: 139 mmol/L (ref 134–144)

## 2017-04-26 DIAGNOSIS — I493 Ventricular premature depolarization: Secondary | ICD-10-CM | POA: Diagnosis not present

## 2017-05-12 ENCOUNTER — Other Ambulatory Visit: Payer: Self-pay

## 2017-05-12 ENCOUNTER — Ambulatory Visit (HOSPITAL_COMMUNITY): Payer: Medicare Other | Attending: Cardiology

## 2017-05-12 DIAGNOSIS — I428 Other cardiomyopathies: Secondary | ICD-10-CM | POA: Diagnosis not present

## 2017-05-12 DIAGNOSIS — I081 Rheumatic disorders of both mitral and tricuspid valves: Secondary | ICD-10-CM | POA: Insufficient documentation

## 2017-05-12 LAB — ECHOCARDIOGRAM COMPLETE
AOASC: 29 cm
CHL CUP DOP CALC LVOT VTI: 22.6 cm
CHL CUP RV SYS PRESS: 30 mmHg
CHL CUP STROKE VOLUME: 28 mL
E decel time: 282 msec
E/e' ratio: 14.7
FS: 39 % (ref 28–44)
IVS/LV PW RATIO, ED: 0.97
LA ID, A-P, ES: 37 mm
LA diam end sys: 37 mm
LA vol A4C: 34 ml
LA vol: 43 mL
LADIAMINDEX: 2.28 cm/m2
LAVOLIN: 26.5 mL/m2
LV E/e'average: 14.7
LV PW d: 9.43 mm — AB (ref 0.6–1.1)
LV SIMPSON'S DISK: 49
LV dias vol: 57 mL (ref 46–106)
LV e' LATERAL: 5.17 cm/s
LV sys vol: 29 mL (ref 14–42)
LVDIAVOLIN: 35 mL/m2
LVEEMED: 14.7
LVOT area: 2.54 cm2
LVOT peak grad rest: 3 mmHg
LVOTD: 18 mm
LVOTPV: 85 cm/s
LVOTSV: 57 mL
LVSYSVOLIN: 18 mL/m2
Lateral S' vel: 10.1 cm/s
MV Dec: 282
MV Peak grad: 2 mmHg
MV pk A vel: 89.3 m/s
MVPKEVEL: 76 m/s
Reg peak vel: 258 cm/s
TDI e' lateral: 5.17
TDI e' medial: 6.92
TR max vel: 258 cm/s

## 2017-05-13 DIAGNOSIS — L718 Other rosacea: Secondary | ICD-10-CM | POA: Diagnosis not present

## 2017-05-21 MED ORDER — PROPOFOL 10 MG/ML IV BOLUS
INTRAVENOUS | Status: AC
  Filled 2017-05-21: qty 40

## 2017-05-21 MED ORDER — MIDAZOLAM HCL 2 MG/2ML IJ SOLN
INTRAMUSCULAR | Status: AC
  Filled 2017-05-21: qty 2

## 2017-05-21 MED ORDER — FENTANYL CITRATE (PF) 250 MCG/5ML IJ SOLN
INTRAMUSCULAR | Status: AC
Start: 2017-05-21 — End: ?
  Filled 2017-05-21: qty 10

## 2017-05-30 ENCOUNTER — Telehealth: Payer: Self-pay | Admitting: Pharmacist

## 2017-05-30 NOTE — Telephone Encounter (Signed)
Followed up with pt 1 month after last HTN visit. She states she has been tolerating her metoprolol well and that it has helped to stabilize her BP readings, although they are still running a little high (140/ upper 70s) and HR in the 60s-70s. Scheduled pt in the HTN clinic for next week to discuss possible med changes to bring BP to goal <130/80.

## 2017-06-02 ENCOUNTER — Ambulatory Visit (INDEPENDENT_AMBULATORY_CARE_PROVIDER_SITE_OTHER): Payer: Medicare Other | Admitting: Pharmacist

## 2017-06-02 VITALS — BP 146/86 | HR 78

## 2017-06-02 DIAGNOSIS — I10 Essential (primary) hypertension: Secondary | ICD-10-CM | POA: Diagnosis not present

## 2017-06-02 MED ORDER — METOPROLOL SUCCINATE ER 100 MG PO TB24: 100.0000 mg | ORAL_TABLET | Freq: Every day | ORAL | 11 refills | Status: DC

## 2017-06-02 NOTE — Patient Instructions (Addendum)
It was nice to see you today.  Increase your metoprolol to 50mg  twice a day (take 2 of the 25mg  tablets twice a day). When you run out, pick up the new 100mg  tablet and take one of them ONCE a day.  Continue to take your lisinopril.  Continue to check your blood pressure readings at home  Follow up in clinic in 4 weeks. Call clinic if you need to reschedule #[phone]

## 2017-06-02 NOTE — Progress Notes (Signed)
Patient ID: Emily Page                 DOB: [birth year]                      MRN: [id number]     HPI: Emily Page is a 66 y.o. female patient of Dr. Mayford Knife who presents today for hypertension follow up. PMH includes dysautonomia, PVCs, non-cardiac CP with normal coronary arteries on cath 07/2014, cardiomyopathy, Raynaud's and HTN. She had sleep study that showed mild OSA. Historically she has had issues with headaches, most of the time them being related to high BP readings. She was seen by Dr. Graciela Husbands in Jan 2018 for dysautonomia related to orthostatic intolerance. At that visit she was started on an ACEi and her amlodipine was stopped due to swelling. Most recently, pt was started on metoprolol tartrate 25mg  BID after recent holter monitor due to PVCs and short runs of atrial tachycardia.  Pt reports feeling well overall, although her daughter was recently diagnosed with breast cancer and she may need to go to [place] in the near future to be with family. She has continued to monitor her BP at home - readings have ranged 130-150s/70-80s. 1 low reading of 108/65. HR 60s-90s. Pt has not had any fainting spells recently. Her amlodipine was discontinued 1 month ago due to lower extremity swelling - pt reports her swelling has improved although she still has some fluid in her ankles occasionally. She states that Dr Graciela Husbands previously told her she should not take fluid pills (already has dry mouth and dry eyes). Her amlodipine did seem to help with her Raynaud's - she states that she has had 2 episodes since discontinuing her amlodipine despite it being 90 degrees out. She is not overly bothered by this although does express concern for the upcoming winter.  Current HTN meds:lisinopril 40mg  daily (morning), metoprolol tartrate 25mg  BID Previously tried:amlodipine 2.5-10mg  daily - lower extremity edema, per Dr Graciela Husbands avoid diuretics (has dry mouth and dry eyes at baseline) BP goal:  <130/27mmHg  Family History: Father - Heart attack, HLD, HTN; Mother - HTN  Social History: Denies tobacco products. She endorses 1 serving/day of alcohol.  Diet:Notes that historically, she would cook more, but lately she has been out of town frequently, visiting family. She has ~2 cups of coffee daily, but doesn't typically drink caffeine throughout the day.   Exercise:She goes to the [facility] and will occasionally go and spend some time on the machines. If she doesn't, she walks for 10-30 minutes. Recently has been traveling a lot to see grandkids so has not been able to exercise.  Home BP Readings: 143/77, low 108/65, usually 130-140s/70s, a few in the 150s/70s. HR 60-80s.  Wt Readings from Last 3 Encounters:  04/24/17 135 lb (61.2 kg)  12/17/16 134 lb 9.6 oz (61.1 kg)  10/28/16 132 lb (59.9 kg)   BP Readings from Last 3 Encounters:  04/24/17 (!) 142/72  04/24/17 (!) 150/80  04/04/17 132/78   Pulse Readings from Last 3 Encounters:  04/24/17 70  04/24/17 65  04/04/17 72    Renal function: CrCl cannot be calculated (Patient's most recent lab result is older than the maximum 21 days allowed.).  Past Medical History:  Diagnosis Date  . Allergic rhinitis    followed by Dr Willa Rough in the past  . Fibromyalgia    followed by Dr Bedelia Person  . Foot pain, right    w intermittent swelling  felt to be related to how she is walking on her foot. evaluated by Dr Leticia Penna (podiatrist)  . GERD (gastroesophageal reflux disease)    Followed by Dr Randa Evens in the past  . 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 History of EKG    w palpitations and nonspecific T-wave changes, stress cardiolite 6/08 showed normal LV size in a systolic function,no maximum effort stress test with moderate work load and good HR achieved,no EKG criteria of ischemia. Did have occassional PVC's  . Hypertension   . Migraines    [facility]   . OSA  (obstructive sleep apnea) 10/28/2016   Very mild  . PVC (premature ventricular contraction)   . Raynaud's disease    followed by Dr Corliss Skains and norvasc has helped  . Rosacea    and seborrheic dermatitis followed by Dr Levy Sjogren    Current Outpatient Prescriptions on File Prior to Visit  Medication Sig Dispense Refill  . aspirin EC 81 MG tablet Take 81 mg by mouth every evening.    Marland Kitchen azelastine (ASTELIN) 137 MCG/SPRAY nasal spray Place 2 sprays into both nostrils daily as needed for rhinitis or allergies. Use in each nostril as directed    . cetirizine (ZYRTEC) 10 MG tablet Take 10 mg by mouth daily.    . Cholecalciferol 2000 UNITS TABS Take 2,000 Units by mouth every morning.    . cyclobenzaprine (FLEXERIL) 5 MG tablet Take 0.5 tablet (2.5 mg) by mouth daily at bedtime    . diclofenac sodium (VOLTAREN) 1 % GEL Apply 2 g topically daily as needed (for pain).     Marland Kitchen esomeprazole (NEXIUM) 40 MG capsule Take 40 mg by mouth daily at 12 noon.    Marland Kitchen estradiol (ESTRACE) 2 MG tablet Take 2 mg by mouth daily.    . fluconazole (DIFLUCAN) 150 MG tablet Take 150 mg by mouth daily as needed (for yeast infection).     . fluticasone (FLONASE) 50 MCG/ACT nasal spray Place 1 spray into both nostrils as needed.     Marland Kitchen ketoconazole (NIZORAL) 2 % cream Apply 1 application topically daily. To face    . ketoconazole (NIZORAL) 2 % shampoo Apply 1 application topically daily.     Marland Kitchen lisinopril (PRINIVIL,ZESTRIL) 40 MG tablet Take 1 tablet (40 mg total) by mouth daily. 90 tablet 3  . Magnesium Malate 1250 (141.7 Mg) MG TABS Take 2 tablets by mouth daily.    . metoprolol tartrate (LOPRESSOR) 25 MG tablet Take 1 tablet (25 mg total) by mouth 2 (two) times daily. 180 tablet 3  . nystatin-triamcinolone ointment (MYCOLOG) Apply 1 application topically daily as needed (to rash).     . RESTASIS 0.05 % ophthalmic emulsion Place 1 drop into both eyes 2 (two) times daily.      No current facility-administered medications on file  prior to visit.     Allergies  Allergen Reactions  . Amoxil [Amoxicillin]     Felt like throat was swelling  . Penicillins     Swelling of throat  . Tape     Electrodes cause Blisters, even with the sensitive kind   . Sulfa Antibiotics     Swelling of eyes  . Zanaflex [Tizanidine]     halluciations  . Polytrim [Polymyxin B-Trimethoprim]     unknown     Assessment/Plan:  1. Hypertension - BP remains above goal <130/25mmHg on metoprolol tartrate 25mg  BID and lisinopril 40mg  daily. Pt previously  had LEE on amlodipine and was advised to avoid diuretics by Dr Graciela Husbands. Will increase metoprolol to 50mg  BID until pt runs out of her current supply, she would then like to switch to long acting metoprolol. Rx sent in for Toprol 100mg  daily for pt to begin after. Will continue lisinopril 40mg  daily. F/u in HTN clinic in 1 month. Advised pt to call clinic if her HR consistently drops < 60. May need to consider rechallenging with another calcium channel blocker if BP remains elevated since this helped with pt's Raynaud's as well as her HTN - would need to monitor closely for swelling.   Megan E. Supple, PharmD, CPP, BCACP [facility] [address], [place], [place] [address] Phone: [phone]; Fax: [phone] 06/02/2017 3:13 PM

## 2017-07-07 ENCOUNTER — Ambulatory Visit: Payer: Medicare Other

## 2017-07-30 ENCOUNTER — Telehealth: Payer: Self-pay | Admitting: Pharmacist

## 2017-07-30 MED ORDER — HYDRALAZINE HCL 25 MG PO TABS: 12.5000 mg | ORAL_TABLET | Freq: Three times a day (TID) | ORAL | 3 refills | Status: DC

## 2017-07-30 NOTE — Telephone Encounter (Signed)
Pt called stating pressure running 176/92 and pressure has been running high (175/84) the last few days. She did have a headache but this has improved some, but she states she is dizzy and this has persisted the last few days with her pressure running high. Discussed treatment options and pt would like to try hydralazine. Will start hydralazine 12.5mg  TID and scheduled follow up in HTN clinic in 2 weeks on 9/24. Pt states understanding and aware to call with additional issues. She is also aware to call/go to ER if symptoms worsen.

## 2017-07-31 DIAGNOSIS — H2513 Age-related nuclear cataract, bilateral: Secondary | ICD-10-CM | POA: Diagnosis not present

## 2017-08-01 ENCOUNTER — Ambulatory Visit: Payer: Medicare Other

## 2017-08-05 DIAGNOSIS — H2513 Age-related nuclear cataract, bilateral: Secondary | ICD-10-CM | POA: Diagnosis not present

## 2017-08-05 DIAGNOSIS — H2512 Age-related nuclear cataract, left eye: Secondary | ICD-10-CM | POA: Diagnosis not present

## 2017-08-05 DIAGNOSIS — H25013 Cortical age-related cataract, bilateral: Secondary | ICD-10-CM | POA: Diagnosis not present

## 2017-08-05 DIAGNOSIS — H02839 Dermatochalasis of unspecified eye, unspecified eyelid: Secondary | ICD-10-CM | POA: Diagnosis not present

## 2017-08-05 DIAGNOSIS — H18413 Arcus senilis, bilateral: Secondary | ICD-10-CM | POA: Diagnosis not present

## 2017-08-12 ENCOUNTER — Telehealth: Payer: Self-pay | Admitting: Pharmacist

## 2017-08-12 NOTE — Telephone Encounter (Signed)
Pt called clinic stating she checked her BP last night because she had a headache and her BP was elevated at 187/99. She called clinic with elevated readings on 9/5 as well and was started on hydralazine 12.5mg  TID at that time. She reports tolerating this well.  Pt states her readings generally run 143-157/74-85 and are generally higher before she takes her medication. Advised pt to increase her hydralazine to 25mg  TID. She will keep f/u appt next week in HTN clinic.

## 2017-08-17 NOTE — Progress Notes (Signed)
Patient ID: Emily Page                 DOB: 1951/04/13                      MRN: [id number]     HPI: Emily Page is a 66 y.o. female referred by Dr. Mayford Knife who presents today for hypertension follow up. PMH includes dysautonomia, PVCs, non-cardiac CP with normal coronary arteries on cath 07/2014, cardiomyopathy, Raynaud's, OSA, and HTN. Historically she has had issues with headaches, most of the time them being related to high BP readings. She was seen by Dr. Graciela Husbands in 11/2016 for dysautonomia related to orthostatic intolerance and was started on an ACEi and her amlodipine was stopped due to swelling. Pt was recently started on metoprolol tartrate 25mg  BID after recent holter monitor due to PVCs and short runs of atrial tachycardia, which was later increased to 50mg  BID.  Pt called clinic to report BP readings in 170-180s twice over past several weeks, and was started on hydralazine with the dose increased to 25mg  TID two weeks later due to continued high BP readings at home. BP readings at home have improved since increasing hydralazine dose and become much less labile, but SBP readings have remained in 140s. Pt reports feeling dizzy occasionally in the morning but has no home BP readings below 140 mmHg. Of note, pt also endorses recent onset of Raynauds symptoms as of this morning, and notes that this is typically worse in Winter months. Her amlodipine had previously helped with this.  Current HTN meds:lisinopril 40mg  daily (morning), metoprolol succinate 100mg  daily (morning), hydralazine 25mg  TID (morning, afternoon, evening) Previously tried:amlodipine 2.5-10mg  daily - lower extremity edema, per Dr Graciela Husbands avoid diuretics (has dry mouth and dry eyes at baseline) BP goal: <130/55mmHg  Family History: Father - Heart attack, HLD, HTN; Mother - HTN  Social History: Denies tobacco products. She endorses 1 serving/day of alcohol.  Diet:Notes that historically, she would cook more, but  lately she has been out of town frequently, visiting family. She has ~2 cups of coffee daily, but doesn't typically drink caffeine throughout the day. Pt endorses eating lots of vegetables and fish.  Exercise:She goes to [facility] and will occasionally go and spend some time on the machines. If she doesn't, she walks for 10-30 minutes. Recently has been traveling a lot to see grandkids so has not been able to exercise.   Wt Readings from Last 3 Encounters:  04/24/17 135 lb (61.2 kg)  12/17/16 134 lb 9.6 oz (61.1 kg)  10/28/16 132 lb (59.9 kg)   BP Readings from Last 3 Encounters:  06/02/17 (!) 146/86  04/24/17 (!) 142/72  04/24/17 (!) 150/80   Pulse Readings from Last 3 Encounters:  06/02/17 78  04/24/17 70  04/24/17 65    Renal function: CrCl cannot be calculated (Patient's most recent lab result is older than the maximum 21 days allowed.).  Past Medical History:  Diagnosis Date  . Allergic rhinitis    followed by Dr Willa Rough in the past  . Fibromyalgia    followed by Dr Bedelia Person  . Foot pain, right    w intermittent swelling felt to be related to how she is walking on her foot. evaluated by Dr Leticia Penna (podiatrist)  . GERD (gastroesophageal reflux disease)    Followed by Dr Randa Evens in the past  . 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 History of EKG  w palpitations and nonspecific T-wave changes, stress cardiolite 6/08 showed normal LV size in a systolic function,no maximum effort stress test with moderate work load and good HR achieved,no EKG criteria of ischemia. Did have occassional PVC's  . Hypertension   . Migraines    [facility].   . OSA (obstructive sleep apnea) 10/28/2016   Very mild  . PVC (premature ventricular contraction)   . Raynaud's disease    followed by Dr Corliss Skains and norvasc has helped  . Rosacea    and seborrheic dermatitis followed by Dr Levy Sjogren    Current Outpatient Prescriptions on  File Prior to Visit  Medication Sig Dispense Refill  . aspirin EC 81 MG tablet Take 81 mg by mouth every evening.    Marland Kitchen azelastine (ASTELIN) 137 MCG/SPRAY nasal spray Place 2 sprays into both nostrils daily as needed for rhinitis or allergies. Use in each nostril as directed    . cetirizine (ZYRTEC) 10 MG tablet Take 10 mg by mouth daily.    . Cholecalciferol 2000 UNITS TABS Take 2,000 Units by mouth every morning.    . cyclobenzaprine (FLEXERIL) 5 MG tablet Take 0.5 tablet (2.5 mg) by mouth daily at bedtime    . diclofenac sodium (VOLTAREN) 1 % GEL Apply 2 g topically daily as needed (for pain).     Marland Kitchen esomeprazole (NEXIUM) 40 MG capsule Take 40 mg by mouth daily at 12 noon.    Marland Kitchen estradiol (ESTRACE) 2 MG tablet Take 2 mg by mouth daily.    . fluconazole (DIFLUCAN) 150 MG tablet Take 150 mg by mouth daily as needed (for yeast infection).     . fluticasone (FLONASE) 50 MCG/ACT nasal spray Place 1 spray into both nostrils as needed.     . hydrALAZINE (APRESOLINE) 25 MG tablet Take 1 tablet (25 mg total) by mouth 3 (three) times daily. 270 tablet 3  . ketoconazole (NIZORAL) 2 % cream Apply 1 application topically daily. To face    . ketoconazole (NIZORAL) 2 % shampoo Apply 1 application topically daily.     Marland Kitchen lisinopril (PRINIVIL,ZESTRIL) 40 MG tablet Take 1 tablet (40 mg total) by mouth daily. 90 tablet 3  . Magnesium Malate 1250 (141.7 Mg) MG TABS Take 2 tablets by mouth daily.    . metoprolol succinate (TOPROL-XL) 100 MG 24 hr tablet Take 1 tablet (100 mg total) by mouth daily. Take with or immediately following a meal. 30 tablet 11  . nystatin-triamcinolone ointment (MYCOLOG) Apply 1 application topically daily as needed (to rash).     . RESTASIS 0.05 % ophthalmic emulsion Place 1 drop into both eyes 2 (two) times daily.      No current facility-administered medications on file prior to visit.     Allergies  Allergen Reactions  . Amoxil [Amoxicillin]     Felt like throat was swelling  .  Penicillins     Swelling of throat  . Tape     Electrodes cause Blisters, even with the sensitive kind   . Sulfa Antibiotics     Swelling of eyes  . Zanaflex [Tizanidine]     halluciations  . Polytrim [Polymyxin B-Trimethoprim]     unknown     Assessment/Plan:  Hypertension - BP remains uncontrolled today and above goal of < 130/80 mmHg. Discussed options including increasing hydralazine and starting a calcium channel blocker, and pt noted that with upcoming Winter months and onset of Raynauds symptoms as noted above she would prefer to restart  a CCB. Pt states that she still has some amlodipine at home so will begin amlodipine 2.5mg  daily - if fluid retention recurs could consider a trial of nifedipine or felodipine. Pt counseled to monitor for S/Sx of low blood pressure and LEE. Follow-up in HTN clinic in 3 weeks.  Fredonia Highland, PharmD PGY-2 Cardiology Pharmacy Resident Pager: [phone] 08/18/2017

## 2017-08-18 ENCOUNTER — Ambulatory Visit (INDEPENDENT_AMBULATORY_CARE_PROVIDER_SITE_OTHER): Payer: Medicare Other | Admitting: Pharmacist

## 2017-08-18 VITALS — BP 144/84 | HR 74

## 2017-08-18 DIAGNOSIS — I10 Essential (primary) hypertension: Secondary | ICD-10-CM | POA: Diagnosis not present

## 2017-08-18 MED ORDER — AMLODIPINE BESYLATE 2.5 MG PO TABS: 2.5000 mg | ORAL_TABLET | Freq: Every day | ORAL | 11 refills | Status: DC

## 2017-08-18 MED ORDER — AMLODIPINE BESYLATE 2.5 MG PO TABS: 2.5000 mg | ORAL_TABLET | Freq: Every day | ORAL | 3 refills | Status: DC

## 2017-08-18 NOTE — Patient Instructions (Signed)
It was great to see you today!  Please continue your lisinopril 40mg  daily, metoprolol succinate 100mg  daily, and hydralazine 25mg  three times daily.  Please start taking amlodipine 2.5mg  daily and monitor blood pressure, dizziness, and signs of fluid buildup.  Please follow-up with clinic in 3 weeks.

## 2017-09-07 NOTE — Progress Notes (Signed)
Patient ID: Emily Page                 DOB: 1951-02-23                      MRN: [id number]     HPI: Emily Page is a 66 y.o. female referred by Dr. Mayford Knife who presents today for hypertension follow up. PMH includes dysautonomia,PVCs, non-cardiac CP with normal coronary arteries on cath 07/2014, cardiomyopathy, Raynaud's, OSA, and HTN. Historically she has had issues with headaches, most of the time them being related to high BP readings. She was seen by Dr. Graciela Husbands in 11/2016 for dysautonomia related to orthostatic intolerance and was started on an ACEi and her amlodipine was stopped due to swelling. Pt was started on metoprolol after holter monitor due to PVCs and short runs of atrial tachycardia. Hydralazine was later added, but BP remained uncontrolled so amlodipine was resumed as pt felt onset of symptoms with Raynauds at last visit and amlodipine previously helped with this.  Pt presents to clinic today in good spirits. Pt reports that Raynauds symptoms have improved significantly since resuming amlodipine and she has had no issues with fluid build up. Pt reports some recent fatigue but no dizziness or fainting. BP well-controlled on home readings and in clinic. Pt wonders whether BP is too low, and reports frustration with having to take hydralazine three times a day.  Current HTN meds:lisinopril  daily (morning), metoprolol succinate  daily (morning), hydralazine  TID (morning, afternoon, evening), amlodipine 2.5mg  daily Previously tried:amlodipine 2.5-10mg  daily - lower extremity edema, per Dr Graciela Husbands avoid diuretics (has dry mouth and dry eyes at baseline) BP goal: <130/46mmHg  Family History: Father - Heart attack, HLD, HTN; Mother - HTN  Social History: Denies tobacco products. She endorses 1 serving/day of alcohol.  Diet:Notes that historically, she would cook more, but lately she has been out of town frequently, visiting family. She has ~2 cups of coffee  daily, but doesn't typically drink caffeine throughout the day. Pt endorses eating lots of vegetables and fish.  Exercise:She goes to [facility] and will occasionally go and spend some time on the machines. If she doesn't, she walks for 10-30 minutes. Recently has been traveling a lot to see grandkids so has not been able to exercise.  Home BP readings: BP readings 110s/60s  Wt Readings from Last 3 Encounters:  04/24/17 135 lb (61.2 kg)  12/17/16 134 lb 9.6 oz (61.1 kg)  10/28/16 132 lb (59.9 kg)   BP Readings from Last 3 Encounters:  08/18/17 (!) 144/84  06/02/17 (!) 146/86  04/24/17 (!) 142/72   Pulse Readings from Last 3 Encounters:  08/18/17 74  06/02/17 78  04/24/17 70    Renal function: CrCl cannot be calculated (Patient's most recent lab result is older than the maximum 21 days allowed.).  Past Medical History:  Diagnosis Date  . Allergic rhinitis    followed by Dr Willa Rough in the past  . Fibromyalgia    followed by Dr Bedelia Person  . Foot pain, right    w intermittent swelling felt to be related to how she is walking on her foot. evaluated by Dr Leticia Penna (podiatrist)  . GERD (gastroesophageal reflux disease)    Followed by Dr Randa Evens in the past  . 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 History of EKG    w palpitations and nonspecific T-wave changes, stress cardiolite 6/08 showed normal LV size  in a systolic function,no maximum effort stress test with moderate work load and good HR achieved,no EKG criteria of ischemia. Did have occassional PVC's  . Hypertension   . Migraines    [facility].   . OSA (obstructive sleep apnea) 10/28/2016   Very mild  . PVC (premature ventricular contraction)   . Raynaud's disease    followed by Dr Corliss Skains and norvasc has helped  . Rosacea    and seborrheic dermatitis followed by Dr Levy Sjogren    Current Outpatient Prescriptions on File Prior to Visit  Medication Sig Dispense  Refill  . amLODipine (NORVASC) 2.5 MG tablet Take 1 tablet (2.5 mg total) by mouth daily. 30 tablet 11  . aspirin EC 81 MG tablet Take 81 mg by mouth every evening.    Marland Kitchen azelastine (ASTELIN) 137 MCG/SPRAY nasal spray Place 2 sprays into both nostrils daily as needed for rhinitis or allergies. Use in each nostril as directed    . cetirizine (ZYRTEC) 10 MG tablet Take 10 mg by mouth daily.    . Cholecalciferol 2000 UNITS TABS Take 2,000 Units by mouth every morning.    . cyclobenzaprine (FLEXERIL) 5 MG tablet Take 0.5 tablet (2.5 mg) by mouth daily at bedtime    . diclofenac sodium (VOLTAREN) 1 % GEL Apply 2 g topically daily as needed (for pain).     Marland Kitchen esomeprazole (NEXIUM) 40 MG capsule Take 40 mg by mouth daily at 12 noon.    Marland Kitchen estradiol (ESTRACE) 2 MG tablet Take 2 mg by mouth daily.    . fluconazole (DIFLUCAN) 150 MG tablet Take 150 mg by mouth daily as needed (for yeast infection).     . fluticasone (FLONASE) 50 MCG/ACT nasal spray Place 1 spray into both nostrils as needed.     . hydrALAZINE (APRESOLINE) 25 MG tablet Take 1 tablet (25 mg total) by mouth 3 (three) times daily. 270 tablet 3  . ketoconazole (NIZORAL) 2 % cream Apply 1 application topically daily. To face    . ketoconazole (NIZORAL) 2 % shampoo Apply 1 application topically daily.     Marland Kitchen lisinopril (PRINIVIL,ZESTRIL) 40 MG tablet Take 1 tablet (40 mg total) by mouth daily. 90 tablet 3  . Magnesium Malate 1250 (141.7 Mg) MG TABS Take 2 tablets by mouth daily.    . metoprolol succinate (TOPROL-XL) 100 MG 24 hr tablet Take 1 tablet (100 mg total) by mouth daily. Take with or immediately following a meal. 30 tablet 11  . nystatin-triamcinolone ointment (MYCOLOG) Apply 1 application topically daily as needed (to rash).     . RESTASIS 0.05 % ophthalmic emulsion Place 1 drop into both eyes 2 (two) times daily.      No current facility-administered medications on file prior to visit.     Allergies  Allergen Reactions  . Amoxil  [Amoxicillin]     Felt like throat was swelling  . Penicillins     Swelling of throat  . Tape     Electrodes cause Blisters, even with the sensitive kind   . Sulfa Antibiotics     Swelling of eyes  . Zanaflex [Tizanidine]     halluciations  . Polytrim [Polymyxin B-Trimethoprim]     unknown     Assessment/Plan:  1. Hypertension - BP well-controlled in clinic today and below goal of < 130/80 mmHg. Pt reports no recurrence of LEE with amlodipine and no S/Sx dizziness. Pt endorses frustration with TID dosing of hydralazine, so will reduce this  to  BID. Will continue amlodipine 2.5mg  daily, lisinopril  daily, and metoprolol succinate  daily. Follow-up in clinic in 4 weeks to assess control with reduced hydralazine dose. Pt counseled to continue monitoring BP at home and report any high/low readings or new symptoms.  Fredonia Highland, PharmD PGY-2 Cardiology Pharmacy Resident Pager: [phone] 09/08/2017

## 2017-09-08 ENCOUNTER — Ambulatory Visit (INDEPENDENT_AMBULATORY_CARE_PROVIDER_SITE_OTHER): Payer: Medicare Other | Admitting: Pharmacist

## 2017-09-08 VITALS — BP 112/74 | HR 73

## 2017-09-08 DIAGNOSIS — I10 Essential (primary) hypertension: Secondary | ICD-10-CM

## 2017-09-08 NOTE — Patient Instructions (Addendum)
It was great to see you today.  Please continue metoprolol 100mg  daily in the morning, lisinopril 40mg  daily in the morning, and amlodipine 2.5mg  daily at night.  Reduce your hydralazine to 25mg  twice daily.  Continue to monitor your blood pressure at home.   Please follow-up in 4 weeks.

## 2017-09-09 DIAGNOSIS — Z23 Encounter for immunization: Secondary | ICD-10-CM | POA: Diagnosis not present

## 2017-09-19 ENCOUNTER — Other Ambulatory Visit: Payer: Self-pay | Admitting: Pharmacist

## 2017-09-19 MED ORDER — HYDRALAZINE HCL 25 MG PO TABS: 25.0000 mg | ORAL_TABLET | Freq: Two times a day (BID) | ORAL | 3 refills | Status: DC

## 2017-10-03 NOTE — Progress Notes (Signed)
Patient ID: Emily Page                 DOB: 05-27-1951                      MRN: [id number]     HPI: Emily Page is a 66 y.o. female referred by Dr. Mayford Knife who presents today for hypertension follow up. PMH includes dysautonomia,PVCs, non-cardiac CP with normal coronary arteries on cath 07/2014, cardiomyopathy, Raynaud's, OSA, and HTN. Historically, she has had issues with headaches, most of the time them being related to high BP readings. She was seen by Dr. Graciela Husbands in 11/2016 for dysautonomia related to orthostatic intolerance and was started onan ACEi and her amlodipine was stopped due to swelling. Pt wasstarted on metoprolol after holter monitor due to PVCs and short runs of atrial tachycardia. Hydralazine was later added, but BP remained uncontrolled so amlodipine was resumed as pt felt onset of symptoms with Raynauds at 08/18/17 visit and amlodipine previously helped with this issue. At last HTN visit 09/08/17 pt's BP was well-controlled but she expressed frustration with TID dosing of hydralazine so dose was reduced to 25mg  BID.  Pt presents in good spirits. She states that she has had some fatigue since starting amlodipine and a little bit of LEE but it is not bothersome. Pt denies falls and HA, but reports some dizziness in the morning that resolves upon eating. Home BP readings have ranged from 130-150s/70s, but pt only checks sporadically once or twice a week.  Current HTN meds:lisinopril 40mg  daily (morning), metoprolol succinate 100mg  daily (morning), hydralazine 25mg  BID (morning, evening), amlodipine 2.5mg  daily (evening)  Previously tried:amlodipine 2.5-10mg  daily - lower extremity edema, per Dr Graciela Husbands avoid diuretics (has dry mouth and dry eyes at baseline)  BP goal: <130/21mmHg  Family History: Father - Heart attack, HLD, HTN; Mother - HTN  Social History: Denies tobacco products. She endorses 1 serving/day of alcohol.  Diet:Notes that historically, she would cook  more, but lately she has been out of town frequently, visiting family. She has ~2 cups of coffee daily, but doesn't typically drink caffeine throughout the day. Pt endorses eating lots of vegetables and fish.   Exercise:She goes to [facility] and will occasionally go and spend some time on the machines. If she doesn't, she walks for 10-30 minutes. Recently has been traveling a lot to see grandkids so has not been able to exercise.   Home BP readings: last 3 readings at home: 155/78, 144/77, 135/77  Wt Readings from Last 3 Encounters:  04/24/17 135 lb (61.2 kg)  12/17/16 134 lb 9.6 oz (61.1 kg)  10/28/16 132 lb (59.9 kg)   BP Readings from Last 3 Encounters:  09/08/17 112/74  08/18/17 (!) 144/84  06/02/17 (!) 146/86   Pulse Readings from Last 3 Encounters:  09/08/17 73  08/18/17 74  06/02/17 78    Renal function: CrCl cannot be calculated (Patient's most recent lab result is older than the maximum 21 days allowed.).  Past Medical History:  Diagnosis Date  . Allergic rhinitis    followed by Dr Willa Rough in the past  . Fibromyalgia    followed by Dr Bedelia Person  . Foot pain, right    w intermittent swelling felt to be related to how she is walking on her foot. evaluated by Dr Leticia Penna (podiatrist)  . GERD (gastroesophageal reflux disease)    Followed by Dr Randa Evens in the past  . Hip dislocation, bilateral (HCC)  as infant. was corrected  . History of EKG    w palpitations and nonspecific T-wave changes, stress cardiolite 6/08 showed normal LV size in a systolic function,no maximum effort stress test with moderate work load and good HR achieved,no EKG criteria of ischemia. Did have occassional PVC's  . Hypertension   . Migraines    [facility].   . OSA (obstructive sleep apnea) 10/28/2016   Very mild  . PVC (premature ventricular contraction)   . Raynaud's disease    followed by Dr Corliss Skainseveshwar and norvasc has helped  . Rosacea    and seborrheic  dermatitis followed by Dr Levy Sjogrenrowe    Current Outpatient Medications on File Prior to Visit  Medication Sig Dispense Refill  . amLODipine (NORVASC) 2.5 MG tablet Take 1 tablet (2.5 mg total) by mouth daily. 30 tablet 11  . aspirin EC 81 MG tablet Take 81 mg by mouth every evening.    Marland Kitchen. azelastine (ASTELIN) 137 MCG/SPRAY nasal spray Place 2 sprays into both nostrils daily as needed for rhinitis or allergies. Use in each nostril as directed    . cetirizine (ZYRTEC) 10 MG tablet Take 10 mg by mouth daily.    . Cholecalciferol 2000 UNITS TABS Take 2,000 Units by mouth every morning.    . cyclobenzaprine (FLEXERIL) 5 MG tablet Take 0.5 tablet (2.5 mg) by mouth daily at bedtime    . diclofenac sodium (VOLTAREN) 1 % GEL Apply 2 g topically daily as needed (for pain).     Marland Kitchen. esomeprazole (NEXIUM) 40 MG capsule Take 40 mg by mouth daily at 12 noon.    Marland Kitchen. estradiol (ESTRACE) 2 MG tablet Take 2 mg by mouth daily.    . fluconazole (DIFLUCAN) 150 MG tablet Take 150 mg by mouth daily as needed (for yeast infection).     . fluticasone (FLONASE) 50 MCG/ACT nasal spray Place 1 spray into both nostrils as needed.     . hydrALAZINE (APRESOLINE) 25 MG tablet Take 1 tablet (25 mg total) by mouth 2 (two) times daily. 180 tablet 3  . ketoconazole (NIZORAL) 2 % cream Apply 1 application topically daily. To face    . ketoconazole (NIZORAL) 2 % shampoo Apply 1 application topically daily.     Marland Kitchen. lisinopril (PRINIVIL,ZESTRIL) 40 MG tablet Take 1 tablet (40 mg total) by mouth daily. 90 tablet 3  . Magnesium Malate 1250 (141.7 Mg) MG TABS Take 2 tablets by mouth daily.    . metoprolol succinate (TOPROL-XL) 100 MG 24 hr tablet Take 1 tablet (100 mg total) by mouth daily. Take with or immediately following a meal. 30 tablet 11  . nystatin-triamcinolone ointment (MYCOLOG) Apply 1 application topically daily as needed (to rash).     . RESTASIS 0.05 % ophthalmic emulsion Place 1 drop into both eyes 2 (two) times daily.      No  current facility-administered medications on file prior to visit.     Allergies  Allergen Reactions  . Amoxil [Amoxicillin]     Felt like throat was swelling  . Penicillins     Swelling of throat  . Tape     Electrodes cause Blisters, even with the sensitive kind   . Sulfa Antibiotics     Swelling of eyes  . Zanaflex [Tizanidine]     halluciations  . Polytrim [Polymyxin B-Trimethoprim]     unknown     Assessment/Plan:  1. Hypertension - BP remains well-controlled in clinic and below goal of <130/80 mmHg  after recent hydralazine dose reduction. Home readings have been a little elevated, but they have not been recorded consistently so will not make any changes at this time. Pt will F/U in HTN clinic as needed and continue amlodipine 2.5mg  daily, metoprolol succinate 100mg  daily, lisinopril 40mg  daily, and hydralazine 25mg  BID. Counseled continue to monitor BP at home more regularly and call clinic if she experiences AE or BP at home remains consistently high.  Fredonia Highland, PharmD PGY-2 Cardiology Pharmacy Resident Pager: [phone] 10/06/2017

## 2017-10-06 ENCOUNTER — Ambulatory Visit (INDEPENDENT_AMBULATORY_CARE_PROVIDER_SITE_OTHER): Payer: Medicare Other | Admitting: Pharmacist

## 2017-10-06 VITALS — BP 120/72 | HR 71

## 2017-10-06 DIAGNOSIS — I10 Essential (primary) hypertension: Secondary | ICD-10-CM | POA: Diagnosis not present

## 2017-10-06 NOTE — Patient Instructions (Addendum)
It was great to see you again! Your blood pressure looks great today!  Continue your amlidipine 2.5mg  daily, lisinopril 40mg  daily, metoprolol succinate 100mg  daily, and hydralazine 25mg  twice daily.  Continue to check your blood pressure at home and keep a log.  Follow-up in clinic as needed.

## 2017-10-20 DIAGNOSIS — H2512 Age-related nuclear cataract, left eye: Secondary | ICD-10-CM | POA: Diagnosis not present

## 2017-10-20 DIAGNOSIS — Z961 Presence of intraocular lens: Secondary | ICD-10-CM | POA: Diagnosis not present

## 2017-10-21 DIAGNOSIS — H2511 Age-related nuclear cataract, right eye: Secondary | ICD-10-CM | POA: Diagnosis not present

## 2017-10-25 HISTORY — PX: EYE SURGERY: SHX253

## 2017-11-10 DIAGNOSIS — H2511 Age-related nuclear cataract, right eye: Secondary | ICD-10-CM | POA: Diagnosis not present

## 2017-11-10 DIAGNOSIS — H25041 Posterior subcapsular polar age-related cataract, right eye: Secondary | ICD-10-CM | POA: Diagnosis not present

## 2017-11-10 DIAGNOSIS — H25011 Cortical age-related cataract, right eye: Secondary | ICD-10-CM | POA: Diagnosis not present

## 2017-12-11 DIAGNOSIS — H04123 Dry eye syndrome of bilateral lacrimal glands: Secondary | ICD-10-CM | POA: Diagnosis not present

## 2017-12-24 DIAGNOSIS — Z961 Presence of intraocular lens: Secondary | ICD-10-CM | POA: Diagnosis not present

## 2017-12-24 DIAGNOSIS — H26492 Other secondary cataract, left eye: Secondary | ICD-10-CM | POA: Diagnosis not present

## 2018-01-27 DIAGNOSIS — H1859 Other hereditary corneal dystrophies: Secondary | ICD-10-CM | POA: Diagnosis not present

## 2018-03-11 DIAGNOSIS — H2511 Age-related nuclear cataract, right eye: Secondary | ICD-10-CM | POA: Diagnosis not present

## 2018-03-11 DIAGNOSIS — H02833 Dermatochalasis of right eye, unspecified eyelid: Secondary | ICD-10-CM | POA: Diagnosis not present

## 2018-03-11 DIAGNOSIS — H04123 Dry eye syndrome of bilateral lacrimal glands: Secondary | ICD-10-CM | POA: Diagnosis not present

## 2018-03-11 DIAGNOSIS — Z961 Presence of intraocular lens: Secondary | ICD-10-CM | POA: Diagnosis not present

## 2018-04-18 ENCOUNTER — Other Ambulatory Visit: Payer: Self-pay | Admitting: Cardiology

## 2018-05-18 ENCOUNTER — Other Ambulatory Visit: Payer: Self-pay | Admitting: Internal Medicine

## 2018-06-03 ENCOUNTER — Other Ambulatory Visit: Payer: Self-pay | Admitting: Pharmacist

## 2018-06-03 MED ORDER — LISINOPRIL 40 MG PO TABS: 40.0000 mg | ORAL_TABLET | Freq: Every day | ORAL | 2 refills | Status: DC

## 2018-06-03 NOTE — Progress Notes (Signed)
Office Visit Note  Patient: Emily Page             Date of Birth: 1951-11-18           MRN: [id number]             PCP: Juluis Rainier, MD Referring: Juluis Rainier, MD Visit Date: 06/17/2018 Occupation: @GUAROCC @  Subjective:  Generalized pain   History of Present Illness: Emily Page is a 67 y.o. female with history of fibromyalgia, sicca syndrome, and Raynaud's disease.  She continues to have generalized muscle aches and muscle tenderness due to fibromyalgia.  She has chronic fatigue and insomnia.  She states her pain is severe in the trapezius muscles.  She states her pain intensifies with frequent weather changes.  She states that the right knee replacement is doing well.  She states she is able to get up and down from the floor after playing with her grand kids more easily.  She denies any migraine headaches in several years.  She continues to have sicca symptoms.  She is no longer using Restasis eye drops.  She has symptoms of Raynaud's in fingers and toes.  She states over all her symptoms of Raynaud's have improved, especially during summer.   Activities of Daily Living:  Patient reports morning stiffness for 2 minutes.   Patient Reports nocturnal pain.  Difficulty dressing/grooming: Denies Difficulty climbing stairs: Reports Difficulty getting out of chair: Reports Difficulty using hands for taps, buttons, cutlery, and/or writing: Denies  Review of Systems  Constitutional: Positive for fatigue.  HENT: Positive for mouth dryness. Negative for mouth sores and nose dryness.   Eyes: Positive for dryness. Negative for pain and visual disturbance.  Respiratory: Negative for cough, hemoptysis, shortness of breath and difficulty breathing.   Cardiovascular: Negative for chest pain, palpitations, hypertension and swelling in legs/feet.  Gastrointestinal: Negative for blood in stool, constipation and diarrhea.  Endocrine: Negative for increased urination.    Genitourinary: Negative for painful urination.  Musculoskeletal: Positive for myalgias, muscle tenderness and myalgias. Negative for arthralgias, joint pain, joint swelling, muscle weakness and morning stiffness.  Skin: Positive for color change and redness (Rosacea ). Negative for pallor, rash, hair loss, nodules/bumps, skin tightness, ulcers and sensitivity to sunlight.  Allergic/Immunologic: Negative for susceptible to infections.  Neurological: Negative for dizziness, numbness, headaches and weakness.  Hematological: Negative for swollen glands.  Psychiatric/Behavioral: Positive for sleep disturbance. Negative for depressed mood. The patient is not nervous/anxious.     PMFS History:  Patient Active Problem List   Diagnosis Date Noted  . Fibromyalgia 03/06/2017  . Sicca (HCC) 03/06/2017  . History of Raynaud's syndrome 03/06/2017  . History of total hip replacement, right 03/06/2017  . Vitamin D deficiency 03/06/2017  . History of migraine 03/06/2017  . Vasovagal syncope 03/06/2017  . 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 OSA (obstructive sleep apnea) 10/28/2016  . Excessive daytime sleepiness 04/22/2016  . Exertional shortness of breath 07/14/2014  . Palpitations 07/14/2014  . Abnormal nuclear stress test 07/14/2014  . Hay fever 04/14/2014  . Allergic rhinitis 04/14/2014  . Cervical pain 04/14/2014  . Fatigue 04/14/2014  . Acid reflux 04/14/2014  . Current drug use 04/14/2014  . Atypical migraine 04/14/2014  . Arthralgia of hand 04/14/2014  . Arthralgia of multiple joints 04/14/2014  . Paroxysmal digital cyanosis 04/14/2014  . Hypertension   . Chest pain, atypical 03/31/2014  . PVC (premature ventricular contraction) 03/31/2014  . Blood glucose elevated 02/02/2014    Past  Medical History:  Diagnosis Date  . Allergic rhinitis    followed by Dr Willa Rough in the past  . Fibromyalgia    followed by Dr Bedelia Person  . Foot pain, right    w intermittent  swelling felt to be related to how she is walking on her foot. evaluated by Dr Leticia Penna (podiatrist)  . GERD (gastroesophageal reflux disease)    Followed by Dr Randa Evens in the past  . 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 History of EKG    w palpitations and nonspecific T-wave changes, stress cardiolite 6/08 showed normal LV size in a systolic function,no maximum effort stress test with moderate work load and good HR achieved,no EKG criteria of ischemia. Did have occassional PVC's  . Hypertension   . Migraines    [facility] past.   . OSA (obstructive sleep apnea) 10/28/2016   Very mild  . PVC (premature ventricular contraction)   . Raynaud's disease    followed by Dr Corliss Skains and norvasc has helped  . Rosacea    and seborrheic dermatitis followed by Dr Levy Sjogren    Family History  Problem Relation Age of Onset  . Hypertension Mother   . Heart attack Father   . Hypertension Father   . Hyperlipidemia Father    Past Surgical History:  Procedure Laterality Date  . CARDIAC CATHETERIZATION    . EYE SURGERY Left 10/2017   cataract extraction   . LEFT HEART CATHETERIZATION WITH CORONARY ANGIOGRAM N/A 07/28/2014   Procedure: LEFT HEART CATHETERIZATION WITH CORONARY ANGIOGRAM;  Surgeon: Marykay Lex, MD;  Location: [facility] CATH LAB;  Service: Cardiovascular;  Laterality: N/A;  . SPLIT NIGHT STUDY  04/10/2016   Social History   Social History Narrative  . Not on file    Objective: Vital Signs: BP 136/79 (BP Location: Left Arm, Patient Position: Sitting, Cuff Size: Normal)   Pulse 71   Resp 14   Ht 5\' 3"  (1.6 m)   Wt 137 lb (62.1 kg)   BMI 24.27 kg/m    Physical Exam  Constitutional: She is oriented to person, place, and time. She appears well-developed and well-nourished.  HENT:  Head: Normocephalic and atraumatic.  Eyes: Conjunctivae and EOM are normal.  Neck: Normal range of motion.  Cardiovascular: Normal rate, regular  rhythm, normal heart sounds and intact distal pulses.  Pulmonary/Chest: Effort normal and breath sounds normal.  Abdominal: Soft. Bowel sounds are normal.  Lymphadenopathy:    She has no cervical adenopathy.  Neurological: She is alert and oriented to person, place, and time.  Skin: Skin is warm and dry. Capillary refill takes less than 2 seconds.  Psychiatric: She has a normal mood and affect. Her behavior is normal.  Nursing note and vitals reviewed.    Musculoskeletal Exam: C-spine, thoracic spine, and lumbar spine good ROM.  No midline spinal tenderness.  No SI joint tenderness.  Shoulder joints, elbow joints, wrist joints, MCPs, PIPs, and DIPs good ROM with no synovitis or tenderness.  Complete fist formation bilaterally.  Hip joints, knee joints, ankle joints, MTPs, PIPs, and DIPs good ROM with no synovitis.  No warmth or effusion of knee joints.  Bilateral knee crepitus.  No tenderness of trochanteric bursa bilaterally.   CDAI Exam: No CDAI exam completed.   Investigation: No additional findings.  Imaging: No results found.  Recent Labs: Lab Results  Component Value Date   WBC 7.6 07/21/2014   HGB 13.9 07/21/2014  PLT 250.0 07/21/2014   NA 139 04/24/2017   K 4.2 04/24/2017   CL 99 04/24/2017   CO2 23 04/24/2017   GLUCOSE 108 (H) 04/24/2017   BUN 8 04/24/2017   CREATININE 0.74 04/24/2017   CALCIUM 9.5 04/24/2017   GFRAA 98 04/24/2017    Speciality Comments: No specialty comments available.  Procedures:  No procedures performed Allergies: Amoxil [amoxicillin]; Penicillins; Tape; Sulfa antibiotics; Zanaflex [tizanidine]; and Polytrim [polymyxin b-trimethoprim]   Assessment / Plan:     Visit Diagnoses: Fibromyalgia: She continues to have generalized pain due to fibromyalgia.  She has muscle aches and muscle tenderness, especially in bilateral upper extremities.  She has tension and tenderness in bilateral trapezius muscles.  She has not had any recent fibromyalgia  flares.  She takes Flexeril PRN for muscle spasms.  She continues to have chronic fatigue and insomnia.  She was given a prescription for evaluation and treatment of fibromyalgia.  We discussed the importance of good sleep hygiene and exercising on a regular basis. A refill of Voltaren gel and Flexeril were sent to the pharmacy.   Raynaud's disease without gangrene: She has no signs of digital ulcerations or gangrene.  Her symptoms have been well controlled.   Sicca syndrome, unspecified (HCC): She continues to have sicca symptoms.  She has no parotid swelling on exam.  She is no longer using Restasis.   History of total hip replacement, right: Doing well.  Good ROM on exam with no discomfort.  She has no difficulty getting up from a seated position on the floor.   History of vitamin D deficiency: She takes a vitamin D supplement daily.   History of migraine: She has not had a migraine headache in several years.   Other medical conditions are listed as follows:   History of hypertension  Vasovagal syncope  Holmes-Adie syndrome, unspecified laterality   Orders: No orders of the defined types were placed in this encounter.  No orders of the defined types were placed in this encounter.   Face-to-face time spent with patient was 30 minutes. Greater than 50% of time was spent in counseling and coordination of care.  Follow-Up Instructions: Return in about 6 months (around 12/18/2018) for Fibromyalgia, Raynaud's syndrome.   Gearldine Bienenstock, PA-C I examined and evaluated the patient with Sherron Ales PA.  Patient had no synovitis on exam today.  She had no active Raynauds.  We gave her a referral for physical therapy per her request.  The plan of care was discussed as noted above.  Pollyann Savoy, MD Note - This record has been created using Animal nutritionist.  Chart creation errors have been sought, but may not always  have been located. Such creation errors do not reflect on  the  standard of medical care.

## 2018-06-17 ENCOUNTER — Ambulatory Visit (INDEPENDENT_AMBULATORY_CARE_PROVIDER_SITE_OTHER): Payer: Medicare Other | Admitting: Rheumatology

## 2018-06-17 ENCOUNTER — Encounter: Payer: Self-pay | Admitting: Rheumatology

## 2018-06-17 VITALS — BP 136/79 | HR 71 | Resp 14 | Ht 63.0 in | Wt 137.0 lb

## 2018-06-17 DIAGNOSIS — H57059 Tonic pupil, unspecified eye: Secondary | ICD-10-CM

## 2018-06-17 DIAGNOSIS — Z96641 Presence of right artificial hip joint: Secondary | ICD-10-CM | POA: Diagnosis not present

## 2018-06-17 DIAGNOSIS — Z8639 Personal history of other endocrine, nutritional and metabolic disease: Secondary | ICD-10-CM | POA: Diagnosis not present

## 2018-06-17 DIAGNOSIS — M797 Fibromyalgia: Secondary | ICD-10-CM

## 2018-06-17 DIAGNOSIS — M3500 Sicca syndrome, unspecified: Secondary | ICD-10-CM | POA: Diagnosis not present

## 2018-06-17 DIAGNOSIS — I7300 Raynaud's syndrome without gangrene: Secondary | ICD-10-CM

## 2018-06-17 DIAGNOSIS — R55 Syncope and collapse: Secondary | ICD-10-CM | POA: Diagnosis not present

## 2018-06-17 DIAGNOSIS — Z8669 Personal history of other diseases of the nervous system and sense organs: Secondary | ICD-10-CM

## 2018-06-17 DIAGNOSIS — Z8679 Personal history of other diseases of the circulatory system: Secondary | ICD-10-CM | POA: Diagnosis not present

## 2018-06-17 MED ORDER — CYCLOBENZAPRINE HCL 5 MG PO TABS: ORAL_TABLET | ORAL | 0 refills | Status: DC

## 2018-06-17 MED ORDER — DICLOFENAC SODIUM 1 % TD GEL: TRANSDERMAL | 3 refills | Status: DC

## 2018-06-23 ENCOUNTER — Other Ambulatory Visit: Payer: Self-pay | Admitting: Internal Medicine

## 2018-06-23 DIAGNOSIS — G8929 Other chronic pain: Secondary | ICD-10-CM | POA: Diagnosis not present

## 2018-06-23 DIAGNOSIS — M6281 Muscle weakness (generalized): Secondary | ICD-10-CM | POA: Diagnosis not present

## 2018-06-23 DIAGNOSIS — M542 Cervicalgia: Secondary | ICD-10-CM | POA: Diagnosis not present

## 2018-06-23 DIAGNOSIS — M545 Low back pain: Secondary | ICD-10-CM | POA: Diagnosis not present

## 2018-06-24 DIAGNOSIS — M6281 Muscle weakness (generalized): Secondary | ICD-10-CM | POA: Diagnosis not present

## 2018-06-24 DIAGNOSIS — G8929 Other chronic pain: Secondary | ICD-10-CM | POA: Diagnosis not present

## 2018-06-24 DIAGNOSIS — M542 Cervicalgia: Secondary | ICD-10-CM | POA: Diagnosis not present

## 2018-06-24 DIAGNOSIS — M545 Low back pain: Secondary | ICD-10-CM | POA: Diagnosis not present

## 2018-06-25 DIAGNOSIS — G8929 Other chronic pain: Secondary | ICD-10-CM | POA: Diagnosis not present

## 2018-06-25 DIAGNOSIS — M542 Cervicalgia: Secondary | ICD-10-CM | POA: Diagnosis not present

## 2018-06-25 DIAGNOSIS — M545 Low back pain: Secondary | ICD-10-CM | POA: Diagnosis not present

## 2018-06-25 DIAGNOSIS — M6281 Muscle weakness (generalized): Secondary | ICD-10-CM | POA: Diagnosis not present

## 2018-06-29 DIAGNOSIS — M542 Cervicalgia: Secondary | ICD-10-CM | POA: Diagnosis not present

## 2018-06-29 DIAGNOSIS — M6281 Muscle weakness (generalized): Secondary | ICD-10-CM | POA: Diagnosis not present

## 2018-06-29 DIAGNOSIS — M545 Low back pain: Secondary | ICD-10-CM | POA: Diagnosis not present

## 2018-06-29 DIAGNOSIS — G8929 Other chronic pain: Secondary | ICD-10-CM | POA: Diagnosis not present

## 2018-07-01 DIAGNOSIS — G8929 Other chronic pain: Secondary | ICD-10-CM | POA: Diagnosis not present

## 2018-07-01 DIAGNOSIS — M542 Cervicalgia: Secondary | ICD-10-CM | POA: Diagnosis not present

## 2018-07-01 DIAGNOSIS — M545 Low back pain: Secondary | ICD-10-CM | POA: Diagnosis not present

## 2018-07-01 DIAGNOSIS — M6281 Muscle weakness (generalized): Secondary | ICD-10-CM | POA: Diagnosis not present

## 2018-07-02 DIAGNOSIS — M542 Cervicalgia: Secondary | ICD-10-CM | POA: Diagnosis not present

## 2018-07-02 DIAGNOSIS — M6281 Muscle weakness (generalized): Secondary | ICD-10-CM | POA: Diagnosis not present

## 2018-07-02 DIAGNOSIS — G8929 Other chronic pain: Secondary | ICD-10-CM | POA: Diagnosis not present

## 2018-07-02 DIAGNOSIS — M545 Low back pain: Secondary | ICD-10-CM | POA: Diagnosis not present

## 2018-07-06 DIAGNOSIS — H16303 Unspecified interstitial keratitis, bilateral: Secondary | ICD-10-CM | POA: Diagnosis not present

## 2018-07-06 DIAGNOSIS — G8929 Other chronic pain: Secondary | ICD-10-CM | POA: Diagnosis not present

## 2018-07-06 DIAGNOSIS — M545 Low back pain: Secondary | ICD-10-CM | POA: Diagnosis not present

## 2018-07-06 DIAGNOSIS — M6281 Muscle weakness (generalized): Secondary | ICD-10-CM | POA: Diagnosis not present

## 2018-07-06 DIAGNOSIS — M542 Cervicalgia: Secondary | ICD-10-CM | POA: Diagnosis not present

## 2018-07-08 DIAGNOSIS — M6281 Muscle weakness (generalized): Secondary | ICD-10-CM | POA: Diagnosis not present

## 2018-07-08 DIAGNOSIS — G8929 Other chronic pain: Secondary | ICD-10-CM | POA: Diagnosis not present

## 2018-07-08 DIAGNOSIS — M542 Cervicalgia: Secondary | ICD-10-CM | POA: Diagnosis not present

## 2018-07-08 DIAGNOSIS — M545 Low back pain: Secondary | ICD-10-CM | POA: Diagnosis not present

## 2018-07-10 DIAGNOSIS — M542 Cervicalgia: Secondary | ICD-10-CM | POA: Diagnosis not present

## 2018-07-10 DIAGNOSIS — M545 Low back pain: Secondary | ICD-10-CM | POA: Diagnosis not present

## 2018-07-10 DIAGNOSIS — M6281 Muscle weakness (generalized): Secondary | ICD-10-CM | POA: Diagnosis not present

## 2018-07-10 DIAGNOSIS — G8929 Other chronic pain: Secondary | ICD-10-CM | POA: Diagnosis not present

## 2018-07-14 DIAGNOSIS — M545 Low back pain: Secondary | ICD-10-CM | POA: Diagnosis not present

## 2018-07-14 DIAGNOSIS — G8929 Other chronic pain: Secondary | ICD-10-CM | POA: Diagnosis not present

## 2018-07-14 DIAGNOSIS — M6281 Muscle weakness (generalized): Secondary | ICD-10-CM | POA: Diagnosis not present

## 2018-07-14 DIAGNOSIS — M542 Cervicalgia: Secondary | ICD-10-CM | POA: Diagnosis not present

## 2018-07-16 DIAGNOSIS — M545 Low back pain: Secondary | ICD-10-CM | POA: Diagnosis not present

## 2018-07-16 DIAGNOSIS — G8929 Other chronic pain: Secondary | ICD-10-CM | POA: Diagnosis not present

## 2018-07-16 DIAGNOSIS — M542 Cervicalgia: Secondary | ICD-10-CM | POA: Diagnosis not present

## 2018-07-16 DIAGNOSIS — M6281 Muscle weakness (generalized): Secondary | ICD-10-CM | POA: Diagnosis not present

## 2018-07-21 DIAGNOSIS — M545 Low back pain: Secondary | ICD-10-CM | POA: Diagnosis not present

## 2018-07-21 DIAGNOSIS — G8929 Other chronic pain: Secondary | ICD-10-CM | POA: Diagnosis not present

## 2018-07-21 DIAGNOSIS — M542 Cervicalgia: Secondary | ICD-10-CM | POA: Diagnosis not present

## 2018-07-21 DIAGNOSIS — M6281 Muscle weakness (generalized): Secondary | ICD-10-CM | POA: Diagnosis not present

## 2018-07-23 DIAGNOSIS — M6281 Muscle weakness (generalized): Secondary | ICD-10-CM | POA: Diagnosis not present

## 2018-07-23 DIAGNOSIS — M542 Cervicalgia: Secondary | ICD-10-CM | POA: Diagnosis not present

## 2018-07-23 DIAGNOSIS — M545 Low back pain: Secondary | ICD-10-CM | POA: Diagnosis not present

## 2018-07-23 DIAGNOSIS — G8929 Other chronic pain: Secondary | ICD-10-CM | POA: Diagnosis not present

## 2018-07-28 DIAGNOSIS — M542 Cervicalgia: Secondary | ICD-10-CM | POA: Diagnosis not present

## 2018-07-28 DIAGNOSIS — M545 Low back pain: Secondary | ICD-10-CM | POA: Diagnosis not present

## 2018-07-28 DIAGNOSIS — M6281 Muscle weakness (generalized): Secondary | ICD-10-CM | POA: Diagnosis not present

## 2018-07-28 DIAGNOSIS — G8929 Other chronic pain: Secondary | ICD-10-CM | POA: Diagnosis not present

## 2018-07-31 DIAGNOSIS — M6281 Muscle weakness (generalized): Secondary | ICD-10-CM | POA: Diagnosis not present

## 2018-07-31 DIAGNOSIS — M545 Low back pain: Secondary | ICD-10-CM | POA: Diagnosis not present

## 2018-07-31 DIAGNOSIS — M542 Cervicalgia: Secondary | ICD-10-CM | POA: Diagnosis not present

## 2018-07-31 DIAGNOSIS — G8929 Other chronic pain: Secondary | ICD-10-CM | POA: Diagnosis not present

## 2018-08-03 DIAGNOSIS — M542 Cervicalgia: Secondary | ICD-10-CM | POA: Diagnosis not present

## 2018-08-03 DIAGNOSIS — M545 Low back pain: Secondary | ICD-10-CM | POA: Diagnosis not present

## 2018-08-03 DIAGNOSIS — M6281 Muscle weakness (generalized): Secondary | ICD-10-CM | POA: Diagnosis not present

## 2018-08-03 DIAGNOSIS — G8929 Other chronic pain: Secondary | ICD-10-CM | POA: Diagnosis not present

## 2018-08-05 DIAGNOSIS — M545 Low back pain: Secondary | ICD-10-CM | POA: Diagnosis not present

## 2018-08-05 DIAGNOSIS — G8929 Other chronic pain: Secondary | ICD-10-CM | POA: Diagnosis not present

## 2018-08-05 DIAGNOSIS — M6281 Muscle weakness (generalized): Secondary | ICD-10-CM | POA: Diagnosis not present

## 2018-08-05 DIAGNOSIS — M542 Cervicalgia: Secondary | ICD-10-CM | POA: Diagnosis not present

## 2018-08-11 ENCOUNTER — Ambulatory Visit (INDEPENDENT_AMBULATORY_CARE_PROVIDER_SITE_OTHER): Payer: Medicare Other | Admitting: Internal Medicine

## 2018-08-11 ENCOUNTER — Encounter: Payer: Self-pay | Admitting: Internal Medicine

## 2018-08-11 VITALS — BP 158/80 | HR 55 | Ht 63.0 in | Wt 135.6 lb

## 2018-08-11 DIAGNOSIS — M545 Low back pain: Secondary | ICD-10-CM | POA: Diagnosis not present

## 2018-08-11 DIAGNOSIS — G901 Familial dysautonomia [Riley-Day]: Secondary | ICD-10-CM

## 2018-08-11 DIAGNOSIS — I493 Ventricular premature depolarization: Secondary | ICD-10-CM | POA: Diagnosis not present

## 2018-08-11 DIAGNOSIS — R002 Palpitations: Secondary | ICD-10-CM

## 2018-08-11 DIAGNOSIS — I429 Cardiomyopathy, unspecified: Secondary | ICD-10-CM

## 2018-08-11 DIAGNOSIS — M6281 Muscle weakness (generalized): Secondary | ICD-10-CM | POA: Diagnosis not present

## 2018-08-11 DIAGNOSIS — G8929 Other chronic pain: Secondary | ICD-10-CM | POA: Diagnosis not present

## 2018-08-11 DIAGNOSIS — M542 Cervicalgia: Secondary | ICD-10-CM | POA: Diagnosis not present

## 2018-08-11 DIAGNOSIS — I259 Chronic ischemic heart disease, unspecified: Secondary | ICD-10-CM

## 2018-08-11 MED ORDER — HYDRALAZINE HCL 25 MG PO TABS: 25.0000 mg | ORAL_TABLET | Freq: Two times a day (BID) | ORAL | 3 refills | Status: DC

## 2018-08-11 MED ORDER — LISINOPRIL 40 MG PO TABS: 40.0000 mg | ORAL_TABLET | Freq: Every day | ORAL | 3 refills | Status: DC

## 2018-08-11 MED ORDER — METOPROLOL SUCCINATE ER 100 MG PO TB24: 100.0000 mg | ORAL_TABLET | Freq: Every day | ORAL | 3 refills | Status: DC

## 2018-08-11 NOTE — Progress Notes (Signed)
Patient Care Team: Juluis Rainier, MD as PCP - General (Family Medicine) Juluis Rainier, MD as Referring Physician (Family Medicine)   HPI  Emily Page is a 67 y.o. female Seen in follow-up for spells suggestive of dysautonomia. They are stereotypical associated with multiple epiphenomena aggravated by heat and showers and prolonged standing. These occur not withstanding the context of an abnormal heart muscle as outlined below  She continues to complain of tachypalpitations. These are aggravated by exertion but not always so. She has tachypalpitations also when lying down and had recordings from her FitBit where her heart rates will go into the 130s while lying. She previously brought in [employer] tracings showing PVCs as well as junctional extrasystoles.   She has omplatings of ongoing spells assoc with palps and aggravted by standing.  She notes her HR will go faster up to 100 bpm, and occ awakens her at night  She struggles with heat intolerance and prolonged standing  No chest pain or edema   DATE TEST    8/15    Echo 45-50   9/15/     Cath   EF  % Normal CA  11/16 cMRI EF 47% No LGE      CPX 11/16 FVC 2.83 (100%)    FEV1 2.13 (94%)     FEV1/FVC 75 (96%)      Past Medical History:  Diagnosis Date  . Allergic rhinitis    followed by Dr Willa Rough in the past  . Fibromyalgia    followed by Dr Bedelia Person  . Foot pain, right    w intermittent swelling felt to be related to how she is walking on her foot. evaluated by Dr Leticia Penna (podiatrist)  . GERD (gastroesophageal reflux disease)    Followed by Dr Randa Evens in the past  . 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 History of EKG    w palpitations and nonspecific T-wave changes, stress cardiolite 6/08 showed normal LV size in a systolic function,no maximum effort stress test with moderate work load and good HR achieved,no EKG criteria of ischemia. Did have occassional PVC's  .  Hypertension   . Migraines    [facility] past.   . OSA (obstructive sleep apnea) 10/28/2016   Very mild  . PVC (premature ventricular contraction)   . Raynaud's disease    followed by Dr Corliss Skains and norvasc has helped  . Rosacea    and seborrheic dermatitis followed by Dr Levy Sjogren    Past Surgical History:  Procedure Laterality Date  . CARDIAC CATHETERIZATION    . EYE SURGERY Left 10/2017   cataract extraction   . LEFT HEART CATHETERIZATION WITH CORONARY ANGIOGRAM N/A 07/28/2014   Procedure: LEFT HEART CATHETERIZATION WITH CORONARY ANGIOGRAM;  Surgeon: Marykay Lex, MD;  Location: [facility] CATH LAB;  Service: Cardiovascular;  Laterality: N/A;  . SPLIT NIGHT STUDY  04/10/2016    Current Outpatient Medications  Medication Sig Dispense Refill  . amLODipine (NORVASC) 2.5 MG tablet Take 1 tablet (2.5 mg total) by mouth daily. 30 tablet 11  . aspirin EC 81 MG tablet Take 81 mg by mouth every evening.    Marland Kitchen azelastine (ASTELIN) 137 MCG/SPRAY nasal spray Place 2 sprays into both nostrils daily as needed for rhinitis or allergies. Use in each nostril as directed    . Cholecalciferol 2000 UNITS TABS Take 2,000 Units by mouth every morning.    . cyclobenzaprine (FLEXERIL) 5 MG  tablet Take half a tablet (2.5 mg) by mouth daily at bedtime. 30 tablet 0  . diclofenac sodium (VOLTAREN) 1 % GEL Apply 3 grams to 3 large joints up to 3 times daily 3 Tube 3  . esomeprazole (NEXIUM) 40 MG capsule Take 40 mg by mouth daily at 12 noon.    Marland Kitchen estradiol (ESTRACE) 2 MG tablet Take 2 mg by mouth daily.    . fluconazole (DIFLUCAN) 150 MG tablet Take 150 mg by mouth daily as needed (for yeast infection).     . hydrALAZINE (APRESOLINE) 25 MG tablet Take 1 tablet (25 mg total) by mouth 2 (two) times daily. 180 tablet 3  . INVELTYS 1 % SUSP INSTILL 1 DROP IN EYE(S) 3 TIMES A DAY FOR 2 WEEKS  1  . ketoconazole (NIZORAL) 2 % shampoo Apply 1 application topically daily.     Marland Kitchen lisinopril  (PRINIVIL,ZESTRIL) 40 MG tablet Take 1 tablet (40 mg total) by mouth daily. 90 tablet 3  . Magnesium Malate 1250 (141.7 Mg) MG TABS Take 2 tablets by mouth daily.    . metoprolol succinate (TOPROL-XL) 100 MG 24 hr tablet Take 1 tablet (100 mg total) by mouth daily. 90 tablet 3  . nystatin-triamcinolone ointment (MYCOLOG) Apply 1 application topically daily as needed (to rash).      No current facility-administered medications for this visit.     Allergies  Allergen Reactions  . Amoxil [Amoxicillin]     Felt like throat was swelling  . Penicillins     Swelling of throat  . Tape     Electrodes cause Blisters, even with the sensitive kind   . Sulfa Antibiotics     Swelling of eyes  . Zanaflex [Tizanidine]     halluciations  . Polytrim [Polymyxin B-Trimethoprim]     unknown      Review of Systems negative except from HPI and PMH  Physical Exam BP (!) 158/80   Pulse (!) 55   Ht 5\' 3"  (1.6 m)   Wt 135 lb 9.6 oz (61.5 kg)   SpO2 98%   BMI 24.02 kg/m  Well developed and nourished in no acute distress HENT normal Neck supple with JVP-flat Clear Regular rate and rhythm, no murmurs or gallops Abd-soft with active BS No Clubbing cyanosis edema Skin-warm and dry A & Oriented  Grossly normal sensory and motor function   ECG sinus rhythm at 76 Intervals 11/02/37 T-wave inversions V1-V6 Frequent PVCs  Assessment and  Plan Dysautonomia  Abnormal ECG  Cardiomyopathy  Hypertension  PVCs  Tachycardia  ECGs remain variable with T wave inversions extending across the anterior precordium.  We will undertake repeat MRI scanning to see if there is been an evolution that might help with a diagnosis.  Previously was normal.  She continues with orthostatic lightheadedness although we have failed to detect hypotension   Exercise intolerance remains problem and worsening.  Her cardiopulmonary stress test previously had demonstrated no cardiac limitation.   More than 50% of  25 min was spent in counseling related to the above        Current medicines are reviewed at length with the patient today .  The patient does not  have concerns regarding medicines.

## 2018-08-11 NOTE — Patient Instructions (Signed)
Medication Instructions:  Your physician recommends that you continue on your current medications as directed. Please refer to the Current Medication list given to you today.  Labwork: None ordered.  Testing/Procedures: Your physician has requested that you have a cardiac MRI. Cardiac MRI uses a computer to create images of your heart as its beating, producing both still and moving pictures of your heart and major blood vessels. For further information please visit [email or web address]. Please follow the instruction sheet given to you today for more information.  Your physician has recommended that you have a cardiopulmonary stress test (CPX). CPX testing is a non-invasive measurement of heart and lung function. It replaces a traditional treadmill stress test. This type of test provides a tremendous amount of information that relates not only to your present condition but also for future outcomes. This test combines measurements of you ventilation, respiratory gas exchange in the lungs, electrocardiogram (EKG), blood pressure and physical response before, during, and following an exercise protocol.    Follow-Up: Your physician wants you to follow-up in: 6 months with Dr Graciela Husbands. You will receive a reminder letter in the mail two months in advance. If you don't receive a letter, please call our office to schedule the follow-up appointment.   Any Other Special Instructions Will Be Listed Below (If Applicable).     If you need a refill on your cardiac medications before your next appointment, please call your pharmacy.

## 2018-08-13 DIAGNOSIS — G8929 Other chronic pain: Secondary | ICD-10-CM | POA: Diagnosis not present

## 2018-08-13 DIAGNOSIS — M545 Low back pain: Secondary | ICD-10-CM | POA: Diagnosis not present

## 2018-08-13 DIAGNOSIS — M542 Cervicalgia: Secondary | ICD-10-CM | POA: Diagnosis not present

## 2018-08-13 DIAGNOSIS — M6281 Muscle weakness (generalized): Secondary | ICD-10-CM | POA: Diagnosis not present

## 2018-08-17 DIAGNOSIS — Z1231 Encounter for screening mammogram for malignant neoplasm of breast: Secondary | ICD-10-CM | POA: Diagnosis not present

## 2018-08-17 DIAGNOSIS — L292 Pruritus vulvae: Secondary | ICD-10-CM | POA: Diagnosis not present

## 2018-08-17 DIAGNOSIS — Z6824 Body mass index (BMI) 24.0-24.9, adult: Secondary | ICD-10-CM | POA: Diagnosis not present

## 2018-08-17 DIAGNOSIS — Z124 Encounter for screening for malignant neoplasm of cervix: Secondary | ICD-10-CM | POA: Diagnosis not present

## 2018-08-18 DIAGNOSIS — M6281 Muscle weakness (generalized): Secondary | ICD-10-CM | POA: Diagnosis not present

## 2018-08-18 DIAGNOSIS — M542 Cervicalgia: Secondary | ICD-10-CM | POA: Diagnosis not present

## 2018-08-18 DIAGNOSIS — G8929 Other chronic pain: Secondary | ICD-10-CM | POA: Diagnosis not present

## 2018-08-18 DIAGNOSIS — M545 Low back pain: Secondary | ICD-10-CM | POA: Diagnosis not present

## 2018-08-20 DIAGNOSIS — Z23 Encounter for immunization: Secondary | ICD-10-CM | POA: Diagnosis not present

## 2018-08-20 DIAGNOSIS — M545 Low back pain: Secondary | ICD-10-CM | POA: Diagnosis not present

## 2018-08-20 DIAGNOSIS — M6281 Muscle weakness (generalized): Secondary | ICD-10-CM | POA: Diagnosis not present

## 2018-08-20 DIAGNOSIS — M542 Cervicalgia: Secondary | ICD-10-CM | POA: Diagnosis not present

## 2018-08-20 DIAGNOSIS — G8929 Other chronic pain: Secondary | ICD-10-CM | POA: Diagnosis not present

## 2018-08-24 DIAGNOSIS — G8929 Other chronic pain: Secondary | ICD-10-CM | POA: Diagnosis not present

## 2018-08-24 DIAGNOSIS — M545 Low back pain: Secondary | ICD-10-CM | POA: Diagnosis not present

## 2018-08-24 DIAGNOSIS — M542 Cervicalgia: Secondary | ICD-10-CM | POA: Diagnosis not present

## 2018-08-24 DIAGNOSIS — M6281 Muscle weakness (generalized): Secondary | ICD-10-CM | POA: Diagnosis not present

## 2018-08-26 DIAGNOSIS — M545 Low back pain: Secondary | ICD-10-CM | POA: Diagnosis not present

## 2018-08-26 DIAGNOSIS — M6281 Muscle weakness (generalized): Secondary | ICD-10-CM | POA: Diagnosis not present

## 2018-08-26 DIAGNOSIS — M542 Cervicalgia: Secondary | ICD-10-CM | POA: Diagnosis not present

## 2018-08-26 DIAGNOSIS — G8929 Other chronic pain: Secondary | ICD-10-CM | POA: Diagnosis not present

## 2018-08-27 ENCOUNTER — Ambulatory Visit (HOSPITAL_COMMUNITY): Payer: Medicare Other | Attending: Internal Medicine

## 2018-08-27 DIAGNOSIS — M797 Fibromyalgia: Secondary | ICD-10-CM | POA: Insufficient documentation

## 2018-08-27 DIAGNOSIS — R9439 Abnormal result of other cardiovascular function study: Secondary | ICD-10-CM | POA: Insufficient documentation

## 2018-08-27 DIAGNOSIS — G43909 Migraine, unspecified, not intractable, without status migrainosus: Secondary | ICD-10-CM | POA: Diagnosis not present

## 2018-08-27 DIAGNOSIS — I7300 Raynaud's syndrome without gangrene: Secondary | ICD-10-CM | POA: Diagnosis not present

## 2018-08-27 DIAGNOSIS — Z79899 Other long term (current) drug therapy: Secondary | ICD-10-CM | POA: Insufficient documentation

## 2018-08-27 DIAGNOSIS — J309 Allergic rhinitis, unspecified: Secondary | ICD-10-CM | POA: Diagnosis not present

## 2018-08-27 DIAGNOSIS — Z7982 Long term (current) use of aspirin: Secondary | ICD-10-CM | POA: Insufficient documentation

## 2018-08-27 DIAGNOSIS — R002 Palpitations: Secondary | ICD-10-CM | POA: Diagnosis not present

## 2018-08-27 DIAGNOSIS — M79671 Pain in right foot: Secondary | ICD-10-CM | POA: Diagnosis not present

## 2018-08-27 DIAGNOSIS — I10 Essential (primary) hypertension: Secondary | ICD-10-CM | POA: Insufficient documentation

## 2018-08-27 DIAGNOSIS — G4733 Obstructive sleep apnea (adult) (pediatric): Secondary | ICD-10-CM | POA: Diagnosis not present

## 2018-08-27 DIAGNOSIS — I493 Ventricular premature depolarization: Secondary | ICD-10-CM | POA: Insufficient documentation

## 2018-08-27 DIAGNOSIS — L719 Rosacea, unspecified: Secondary | ICD-10-CM | POA: Diagnosis not present

## 2018-08-27 DIAGNOSIS — R0600 Dyspnea, unspecified: Secondary | ICD-10-CM

## 2018-08-27 DIAGNOSIS — K219 Gastro-esophageal reflux disease without esophagitis: Secondary | ICD-10-CM | POA: Diagnosis not present

## 2018-08-30 ENCOUNTER — Other Ambulatory Visit: Payer: Self-pay | Admitting: Rheumatology

## 2018-08-31 MED ORDER — CYCLOBENZAPRINE HCL 5 MG PO TABS: ORAL_TABLET | ORAL | 0 refills | Status: DC

## 2018-08-31 NOTE — Telephone Encounter (Addendum)
Last Visit: 06/17/18 Next Visit: 12/17/18  Can you please verify dose. The pharmacy has sent a prescription requesting Cyclobenzaprine 10 mg q hs. On 06/17/18 you sent in a prescription for Cyclobenzaprine 5 mg take half a tablet at bedtime. Please advise.

## 2018-09-01 DIAGNOSIS — M545 Low back pain: Secondary | ICD-10-CM | POA: Diagnosis not present

## 2018-09-01 DIAGNOSIS — G8929 Other chronic pain: Secondary | ICD-10-CM | POA: Diagnosis not present

## 2018-09-01 DIAGNOSIS — M542 Cervicalgia: Secondary | ICD-10-CM | POA: Diagnosis not present

## 2018-09-01 DIAGNOSIS — M6281 Muscle weakness (generalized): Secondary | ICD-10-CM | POA: Diagnosis not present

## 2018-09-03 DIAGNOSIS — M545 Low back pain: Secondary | ICD-10-CM | POA: Diagnosis not present

## 2018-09-03 DIAGNOSIS — M6281 Muscle weakness (generalized): Secondary | ICD-10-CM | POA: Diagnosis not present

## 2018-09-03 DIAGNOSIS — M542 Cervicalgia: Secondary | ICD-10-CM | POA: Diagnosis not present

## 2018-09-03 DIAGNOSIS — G8929 Other chronic pain: Secondary | ICD-10-CM | POA: Diagnosis not present

## 2018-09-04 ENCOUNTER — Ambulatory Visit (HOSPITAL_COMMUNITY): Admission: RE | Admit: 2018-09-04 | Payer: Medicare Other | Source: Ambulatory Visit

## 2018-09-07 ENCOUNTER — Telehealth: Payer: Self-pay

## 2018-09-07 NOTE — Telephone Encounter (Signed)
Pt aware of test results; she has agreed with a follow up MRI. She had no additional questions. Will follow up with scheduling to arrange.

## 2018-09-07 NOTE — Telephone Encounter (Signed)
-----   Message from Duke Salvia, MD sent at 09/05/2018  2:27 PM EDT ----- Please Inform Patient that -CPX   is abnormal and will require further eval Dr DB concurred with cMRI and then perhaps repeat exercise testing with invasive hemodynamic monitoring  ( ie swan ) and ability to look for MR   Thanks

## 2018-09-11 DIAGNOSIS — G8929 Other chronic pain: Secondary | ICD-10-CM | POA: Diagnosis not present

## 2018-09-11 DIAGNOSIS — M542 Cervicalgia: Secondary | ICD-10-CM | POA: Diagnosis not present

## 2018-09-11 DIAGNOSIS — M6281 Muscle weakness (generalized): Secondary | ICD-10-CM | POA: Diagnosis not present

## 2018-09-11 DIAGNOSIS — M545 Low back pain: Secondary | ICD-10-CM | POA: Diagnosis not present

## 2018-09-17 DIAGNOSIS — M542 Cervicalgia: Secondary | ICD-10-CM | POA: Diagnosis not present

## 2018-09-17 DIAGNOSIS — M545 Low back pain: Secondary | ICD-10-CM | POA: Diagnosis not present

## 2018-09-17 DIAGNOSIS — M6281 Muscle weakness (generalized): Secondary | ICD-10-CM | POA: Diagnosis not present

## 2018-09-17 DIAGNOSIS — G8929 Other chronic pain: Secondary | ICD-10-CM | POA: Diagnosis not present

## 2018-09-22 ENCOUNTER — Encounter: Payer: Self-pay | Admitting: *Deleted

## 2018-09-22 DIAGNOSIS — M6281 Muscle weakness (generalized): Secondary | ICD-10-CM | POA: Diagnosis not present

## 2018-09-22 DIAGNOSIS — M542 Cervicalgia: Secondary | ICD-10-CM | POA: Diagnosis not present

## 2018-09-22 DIAGNOSIS — M545 Low back pain: Secondary | ICD-10-CM | POA: Diagnosis not present

## 2018-09-22 DIAGNOSIS — G8929 Other chronic pain: Secondary | ICD-10-CM | POA: Diagnosis not present

## 2018-09-24 DIAGNOSIS — M542 Cervicalgia: Secondary | ICD-10-CM | POA: Diagnosis not present

## 2018-09-24 DIAGNOSIS — G8929 Other chronic pain: Secondary | ICD-10-CM | POA: Diagnosis not present

## 2018-09-24 DIAGNOSIS — M6281 Muscle weakness (generalized): Secondary | ICD-10-CM | POA: Diagnosis not present

## 2018-09-24 DIAGNOSIS — M545 Low back pain: Secondary | ICD-10-CM | POA: Diagnosis not present

## 2018-09-28 DIAGNOSIS — M545 Low back pain: Secondary | ICD-10-CM | POA: Diagnosis not present

## 2018-09-28 DIAGNOSIS — M6281 Muscle weakness (generalized): Secondary | ICD-10-CM | POA: Diagnosis not present

## 2018-09-28 DIAGNOSIS — G8929 Other chronic pain: Secondary | ICD-10-CM | POA: Diagnosis not present

## 2018-09-28 DIAGNOSIS — M542 Cervicalgia: Secondary | ICD-10-CM | POA: Diagnosis not present

## 2018-09-30 DIAGNOSIS — M542 Cervicalgia: Secondary | ICD-10-CM | POA: Diagnosis not present

## 2018-09-30 DIAGNOSIS — G8929 Other chronic pain: Secondary | ICD-10-CM | POA: Diagnosis not present

## 2018-09-30 DIAGNOSIS — M6281 Muscle weakness (generalized): Secondary | ICD-10-CM | POA: Diagnosis not present

## 2018-09-30 DIAGNOSIS — M545 Low back pain: Secondary | ICD-10-CM | POA: Diagnosis not present

## 2018-10-06 DIAGNOSIS — M6281 Muscle weakness (generalized): Secondary | ICD-10-CM | POA: Diagnosis not present

## 2018-10-06 DIAGNOSIS — M545 Low back pain: Secondary | ICD-10-CM | POA: Diagnosis not present

## 2018-10-06 DIAGNOSIS — M542 Cervicalgia: Secondary | ICD-10-CM | POA: Diagnosis not present

## 2018-10-06 DIAGNOSIS — G8929 Other chronic pain: Secondary | ICD-10-CM | POA: Diagnosis not present

## 2018-10-07 ENCOUNTER — Encounter: Payer: Self-pay | Admitting: Internal Medicine

## 2018-10-14 ENCOUNTER — Other Ambulatory Visit (HOSPITAL_COMMUNITY): Payer: Medicare Other

## 2018-10-15 DIAGNOSIS — M545 Low back pain: Secondary | ICD-10-CM | POA: Diagnosis not present

## 2018-10-15 DIAGNOSIS — M542 Cervicalgia: Secondary | ICD-10-CM | POA: Diagnosis not present

## 2018-10-15 DIAGNOSIS — M6281 Muscle weakness (generalized): Secondary | ICD-10-CM | POA: Diagnosis not present

## 2018-10-15 DIAGNOSIS — G8929 Other chronic pain: Secondary | ICD-10-CM | POA: Diagnosis not present

## 2018-10-16 ENCOUNTER — Other Ambulatory Visit: Payer: Self-pay | Admitting: Pharmacist

## 2018-10-16 MED ORDER — AMLODIPINE BESYLATE 2.5 MG PO TABS: 2.5000 mg | ORAL_TABLET | Freq: Every day | ORAL | 11 refills | Status: DC

## 2018-10-19 ENCOUNTER — Other Ambulatory Visit: Payer: Self-pay | Admitting: Physician Assistant

## 2018-10-19 ENCOUNTER — Ambulatory Visit (HOSPITAL_COMMUNITY)
Admission: RE | Admit: 2018-10-19 | Discharge: 2018-10-19 | Disposition: A | Discharge status: Home / Self Care | Payer: Medicare Other | Source: Ambulatory Visit | Attending: Internal Medicine | Admitting: Internal Medicine

## 2018-10-19 DIAGNOSIS — I429 Cardiomyopathy, unspecified: Secondary | ICD-10-CM | POA: Diagnosis not present

## 2018-10-19 DIAGNOSIS — I259 Chronic ischemic heart disease, unspecified: Secondary | ICD-10-CM | POA: Diagnosis not present

## 2018-10-19 LAB — CREATININE, SERUM
CREATININE: 1.02 mg/dL — AB (ref 0.44–1.00)
GFR, EST NON AFRICAN AMERICAN: 56 mL/min — AB (ref >60)

## 2018-10-19 MED ORDER — GADOBUTROL 1 MMOL/ML IV SOLN
7.5000 mL | Freq: Once | INTRAVENOUS | Status: AC | PRN
  Administered 2018-10-19: 7.5 mL via INTRAVENOUS

## 2018-10-20 NOTE — Telephone Encounter (Signed)
Last Visit: 06/17/18 Next Visit: 12/17/18  Okay to refill per Dr. Corliss Skains

## 2018-10-26 ENCOUNTER — Telehealth: Payer: Self-pay | Admitting: *Deleted

## 2018-10-26 DIAGNOSIS — M6281 Muscle weakness (generalized): Secondary | ICD-10-CM | POA: Diagnosis not present

## 2018-10-26 DIAGNOSIS — M545 Low back pain: Secondary | ICD-10-CM | POA: Diagnosis not present

## 2018-10-26 DIAGNOSIS — M542 Cervicalgia: Secondary | ICD-10-CM | POA: Diagnosis not present

## 2018-10-26 DIAGNOSIS — G8929 Other chronic pain: Secondary | ICD-10-CM | POA: Diagnosis not present

## 2018-10-26 NOTE — Telephone Encounter (Signed)
-----   Message from Duke Salvia, MD sent at 10/26/2018  8:29 AM EST ----- Please Inform Patient that study was normal  Thanks

## 2018-10-26 NOTE — Telephone Encounter (Signed)
Pt has been notified of Cardiac MRI results by phone with verbal understanding. Will mail a copy of results to pt, pt address has been verified. Pt thanked me for the call.

## 2018-11-06 DIAGNOSIS — M6281 Muscle weakness (generalized): Secondary | ICD-10-CM | POA: Diagnosis not present

## 2018-11-06 DIAGNOSIS — M542 Cervicalgia: Secondary | ICD-10-CM | POA: Diagnosis not present

## 2018-11-06 DIAGNOSIS — M545 Low back pain: Secondary | ICD-10-CM | POA: Diagnosis not present

## 2018-11-06 DIAGNOSIS — G8929 Other chronic pain: Secondary | ICD-10-CM | POA: Diagnosis not present

## 2018-11-09 DIAGNOSIS — M6281 Muscle weakness (generalized): Secondary | ICD-10-CM | POA: Diagnosis not present

## 2018-11-09 DIAGNOSIS — M542 Cervicalgia: Secondary | ICD-10-CM | POA: Diagnosis not present

## 2018-11-09 DIAGNOSIS — G8929 Other chronic pain: Secondary | ICD-10-CM | POA: Diagnosis not present

## 2018-11-09 DIAGNOSIS — M545 Low back pain: Secondary | ICD-10-CM | POA: Diagnosis not present

## 2018-11-10 DIAGNOSIS — M6281 Muscle weakness (generalized): Secondary | ICD-10-CM | POA: Diagnosis not present

## 2018-11-10 DIAGNOSIS — M545 Low back pain: Secondary | ICD-10-CM | POA: Diagnosis not present

## 2018-11-10 DIAGNOSIS — M542 Cervicalgia: Secondary | ICD-10-CM | POA: Diagnosis not present

## 2018-11-10 DIAGNOSIS — G8929 Other chronic pain: Secondary | ICD-10-CM | POA: Diagnosis not present

## 2018-11-13 DIAGNOSIS — M6281 Muscle weakness (generalized): Secondary | ICD-10-CM | POA: Diagnosis not present

## 2018-11-13 DIAGNOSIS — M545 Low back pain: Secondary | ICD-10-CM | POA: Diagnosis not present

## 2018-11-13 DIAGNOSIS — M542 Cervicalgia: Secondary | ICD-10-CM | POA: Diagnosis not present

## 2018-11-13 DIAGNOSIS — G8929 Other chronic pain: Secondary | ICD-10-CM | POA: Diagnosis not present

## 2018-11-16 DIAGNOSIS — M545 Low back pain: Secondary | ICD-10-CM | POA: Diagnosis not present

## 2018-11-16 DIAGNOSIS — M542 Cervicalgia: Secondary | ICD-10-CM | POA: Diagnosis not present

## 2018-11-16 DIAGNOSIS — G8929 Other chronic pain: Secondary | ICD-10-CM | POA: Diagnosis not present

## 2018-11-16 DIAGNOSIS — M6281 Muscle weakness (generalized): Secondary | ICD-10-CM | POA: Diagnosis not present

## 2018-12-04 NOTE — Progress Notes (Signed)
Office Visit Note  Patient: Emily Page             Date of Birth: 1951/09/03           MRN: [id number]             PCP: Juluis Rainier, MD Referring: Juluis Rainier, MD Visit Date: 12/17/2018 Occupation: @GUAROCC @  Subjective:  Muscle aches   History of Present Illness: Emily Page is a 68 y.o. female with history of fibromyalgia, sicca syndrome, and Raynaud's.  She takes Flexeril 2.5 mg at bedtime for muscle spasms and uses voltaren gel topically for pain relief.  She continues to have generalized muscle aches muscle tenderness due to fibromyalgia.  She continues to have trapezius muscle spasms bilaterally.  She states that she went to physical therapy which helped with bilateral trochanter bursitis and IT band syndrome.  She reports that she continues to have chronic fatigue and interrupted sleep at night.  She states that about 7 to 10 days ago she was playing Wii with her grandchildren and started having pain in the left knee joint.  She states the left knee has been swelling intermittently.  She states that she has been using Voltaren gel topically PRN.  She states that when the pain is severe she took Advil for pain relief but does not like taking over-the-counter medications due to experiencing GI side effects.  She reports that her right hip replacement is doing well.  She reports that she has started exercising again after being sick.  She states that she walks for exercise.  She reports that she continues have intermittent symptoms of Raynaud's but her symptoms are less severe.  She wears gloves on a regular basis. 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  She has no eye pain or dryness at this time.   Activities of Daily Living:  Patient reports morning stiffness for 0 minutes.   Patient Reports nocturnal pain.  Difficulty dressing/grooming: Denies Difficulty climbing stairs: Reports Difficulty getting  out of chair: Reports Difficulty using hands for taps, buttons, cutlery, and/or writing: Denies  Review of Systems  Constitutional: Positive for fatigue.  HENT: Positive for mouth dryness. Negative for mouth sores and nose dryness.   Eyes: Negative for pain, visual disturbance and dryness.  Respiratory: Negative for cough, hemoptysis, shortness of breath and difficulty breathing.   Cardiovascular: Positive for palpitations. Negative for chest pain, hypertension and swelling in legs/feet.  Gastrointestinal: Negative for blood in stool, constipation and diarrhea.  Endocrine: Negative for increased urination.  Genitourinary: Negative for painful urination.  Musculoskeletal: Positive for arthralgias, joint pain, joint swelling, myalgias, muscle tenderness and myalgias. Negative for muscle weakness and morning stiffness.  Skin: Positive for color change. Negative for pallor, rash, hair loss, nodules/bumps, skin tightness, ulcers and sensitivity to sunlight.  Allergic/Immunologic: Negative for susceptible to infections.  Neurological: Negative for dizziness, numbness, headaches and weakness.  Hematological: Negative for swollen glands.  Psychiatric/Behavioral: Positive for sleep disturbance. Negative for depressed mood. The patient is not nervous/anxious.     PMFS History:  Patient Active Problem List   Diagnosis Date Noted  . Fibromyalgia 03/06/2017  . Sicca (HCC) 03/06/2017  . History of Raynaud's syndrome 03/06/2017  . History of total hip replacement, right 03/06/2017  . Vitamin D deficiency 03/06/2017  . History of migraine 03/06/2017  . Vasovagal syncope 03/06/2017  . 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 OSA (obstructive sleep apnea) 10/28/2016  .  Excessive daytime sleepiness 04/22/2016  . Exertional shortness of breath 07/14/2014  . Palpitations 07/14/2014  . Abnormal nuclear stress test 07/14/2014  . Hay fever 04/14/2014  . Allergic rhinitis 04/14/2014  .  Cervical pain 04/14/2014  . Fatigue 04/14/2014  . Acid reflux 04/14/2014  . Current drug use 04/14/2014  . Atypical migraine 04/14/2014  . Arthralgia of hand 04/14/2014  . Arthralgia of multiple joints 04/14/2014  . Paroxysmal digital cyanosis 04/14/2014  . Hypertension   . Chest pain, atypical 03/31/2014  . PVC (premature ventricular contraction) 03/31/2014  . Blood glucose elevated 02/02/2014    Past Medical History:  Diagnosis Date  . Allergic rhinitis    followed by Dr Willa Rough in the past  . Fibromyalgia    followed by Dr Bedelia Person  . Foot pain, right    w intermittent swelling felt to be related to how she is walking on her foot. evaluated by Dr Leticia Penna (podiatrist)  . GERD (gastroesophageal reflux disease)    Followed by Dr Randa Evens in the past  . 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 History of EKG    w palpitations and nonspecific T-wave changes, stress cardiolite 6/08 showed normal LV size in a systolic function,no maximum effort stress test with moderate work load and good HR achieved,no EKG criteria of ischemia. Did have occassional PVC's  . Hypertension   . Migraines    [facility].   . OSA (obstructive sleep apnea) 10/28/2016   Very mild  . PVC (premature ventricular contraction)   . Raynaud's disease    followed by Dr Corliss Skains and norvasc has helped  . Rosacea    and seborrheic dermatitis followed by Dr Levy Sjogren    Family History  Problem Relation Age of Onset  . Hypertension Mother   . Heart attack Father   . Hypertension Father   . Hyperlipidemia Father    Past Surgical History:  Procedure Laterality Date  . CARDIAC CATHETERIZATION    . EYE SURGERY Left 10/2017   cataract extraction   . LEFT HEART CATHETERIZATION WITH CORONARY ANGIOGRAM N/A 07/28/2014   Procedure: LEFT HEART CATHETERIZATION WITH CORONARY ANGIOGRAM;  Surgeon: Marykay Lex, MD;  Location: [facility] CATH LAB;  Service: Cardiovascular;   Laterality: N/A;  . SPLIT NIGHT STUDY  04/10/2016   Social History   Social History Narrative  . Not on file   Immunization History  Administered Date(s) Administered  . Influenza,inj,Quad PF,6+ Mos 08/24/2015     Objective: Vital Signs: BP 127/71 (BP Location: Left Arm, Patient Position: Sitting, Cuff Size: Normal)   Pulse 65   Resp 14   Ht 5\' 2"  (1.575 m)   Wt 137 lb 6.4 oz (62.3 kg)   BMI 25.13 kg/m    Physical Exam Vitals signs and nursing note reviewed.  Constitutional:      Appearance: She is well-developed.  HENT:     Head: Normocephalic and atraumatic.  Eyes:     Conjunctiva/sclera: Conjunctivae normal.  Neck:     Musculoskeletal: Normal range of motion.  Cardiovascular:     Rate and Rhythm: Normal rate and regular rhythm.     Heart sounds: Normal heart sounds.  Pulmonary:     Effort: Pulmonary effort is normal.     Breath sounds: Normal breath sounds.  Abdominal:     General: Bowel sounds are normal.     Palpations: Abdomen is soft.  Lymphadenopathy:     Cervical: No cervical  adenopathy.  Skin:    General: Skin is warm and dry.     Capillary Refill: Capillary refill takes less than 2 seconds.  Neurological:     Mental Status: She is alert and oriented to person, place, and time.  Psychiatric:        Behavior: Behavior normal.      Musculoskeletal Exam: Generalized hyperalgesia and positive tender points on exam.  C-spine limited ROM with lateral rotation.  Thoracic spine and lumbar spine good range of motion.  No midline spinal tenderness.  No SI joint tenderness.  Shoulder joints, elbow joints, wrist joints MCPs, PIPs, DIPs good range of motion with no synovitis.  She has complete fist formation bilaterally. Right hip replacement has good ROM with no discomfort.  She has discomfort with left knee ROM. She has mild warmth of the left knee joint. Right knee good ROM with no warmth or effusion.  Ankle joints, MTPs, PIPs, and DIPs good ROM with no synovitis.   No tenderness or swelling of ankle joints.   CDAI Exam: CDAI Score: Not documented Patient Global Assessment: Not documented; Provider Global Assessment: Not documented Swollen: Not documented; Tender: Not documented Joint Exam   Not documented   There is currently no information documented on the homunculus. Go to the Rheumatology activity and complete the homunculus joint exam.  Investigation: No additional findings.  Imaging: No results found.  Recent Labs: Lab Results  Component Value Date   WBC 7.6 07/21/2014   HGB 13.9 07/21/2014   PLT 250.0 07/21/2014   NA 139 04/24/2017   K 4.2 04/24/2017   CL 99 04/24/2017   CO2 23 04/24/2017   GLUCOSE 108 (H) 04/24/2017   BUN 8 04/24/2017   CREATININE 1.02 (H) 10/19/2018   CALCIUM 9.5 04/24/2017   GFRAA >60 10/19/2018    Speciality Comments: No specialty comments available.  Procedures:  No procedures performed Allergies: Amoxil [amoxicillin]; Penicillins; Tape; Sulfa antibiotics; Zanaflex [tizanidine]; and Polytrim [polymyxin b-trimethoprim]   Assessment / Plan:     Visit Diagnoses: Fibromyalgia: She continues have generalized hyperalgesia and positive tender points on exam.  She has trapezius muscle spasms and muscle tenderness bilaterally.  She continues to take Flexeril 2.5 mg by mouth at bedtime to help with muscle spasms.  She was encouraged to try to stay active and exercise on a regular basis.  She has started walking more for exercise.  Her left knee has been causing increased discomfort and she has been using Voltaren gel topically.  She is given a handout of knee exercises that she can perform at home.  A refill Voltaren gel was also sent to the pharmacy.  She continues to have chronic fatigue and interrupted sleep at night.  We discussed the importance of good sleep hygiene.  She will follow-up in the office in 6 months.  Raynaud's disease without gangrene: She has very intermittent symptoms of Raynaud's.  She has no  digital ulcerations or signs of gangrene on exam.  She was encouraged to wear gloves and keep her core body temperature warm.  Sicca syndrome, unspecified (HCC): She continues to have mouth dryness.  She has no parotid swelling on exam.  She is not complaining of any eye dryness at this time.  History of total hip replacement, right: Doing well.  She is good range of motion with no discomfort at this time.  Acute pain of left knee: About 7 to 10 days ago she was playing Wii bowling with her grandchildren and started having  increased left knee joint pain.  She has noticed intermittent joint swelling of the left knee joint.  She declined an x-ray of the left knee today.  She has been using Voltaren gel PRN.  She was reminded that she can use Voltaren gel topically 3 times daily.  A refill Voltaren gel was sent to the pharmacy today.  She was given a handout of knee exercises that she can perform at home.  She requested a prescription for a left knee brace.  She was advised to notify us if her pain persists.  History of vitamin D deficiency: She takes a calcium and vitamin D supplement daily.  Other medical conditions are listed as follows  History of migraine  History of hypertension  Anterior basement membrane dystrophy: She has no eye pain, eye dryness, or photophobia at this time.  She has not had any recent vision changes.    Orders: No orders of the defined types were placed in this encounter.  Meds ordered this encounter  Medications  . cyclobenzaprine (FLEXERIL) 5 MG tablet    Sig: Take half tablet (2.5 mg total) by mouth at bedtime.    Dispense:  30 tablet    Refill:  0  . diclofenac sodium (VOLTAREN) 1 % GEL    Sig: Apply 3 grams to 3 large joints up to 3 times daily    Dispense:  3 Tube    Refill:  3    Face-to-face time spent with patient was 30  minutes. Greater than 50% of time was spent in counseling and coordination of care.  Follow-Up Instructions: Return in about 6  months (around 06/17/2019) for Fibromyalgia, Raynaud's syndrome.   Gearldine Bienenstock, PA-C  Note - This record has been created using Dragon software.  Chart creation errors have been sought, but may not always  have been located. Such creation errors do not reflect on  the standard of medical care.

## 2018-12-17 ENCOUNTER — Encounter: Payer: Self-pay | Admitting: Physician Assistant

## 2018-12-17 ENCOUNTER — Ambulatory Visit (INDEPENDENT_AMBULATORY_CARE_PROVIDER_SITE_OTHER): Payer: Medicare Other | Admitting: Physician Assistant

## 2018-12-17 VITALS — BP 127/71 | HR 65 | Resp 14 | Ht 62.0 in | Wt 137.4 lb

## 2018-12-17 DIAGNOSIS — H1852 Epithelial (juvenile) corneal dystrophy: Secondary | ICD-10-CM

## 2018-12-17 DIAGNOSIS — M25562 Pain in left knee: Secondary | ICD-10-CM

## 2018-12-17 DIAGNOSIS — M3500 Sicca syndrome, unspecified: Secondary | ICD-10-CM | POA: Diagnosis not present

## 2018-12-17 DIAGNOSIS — Z96641 Presence of right artificial hip joint: Secondary | ICD-10-CM | POA: Diagnosis not present

## 2018-12-17 DIAGNOSIS — Z8639 Personal history of other endocrine, nutritional and metabolic disease: Secondary | ICD-10-CM

## 2018-12-17 DIAGNOSIS — Z8679 Personal history of other diseases of the circulatory system: Secondary | ICD-10-CM | POA: Diagnosis not present

## 2018-12-17 DIAGNOSIS — Z8669 Personal history of other diseases of the nervous system and sense organs: Secondary | ICD-10-CM

## 2018-12-17 DIAGNOSIS — I7300 Raynaud's syndrome without gangrene: Secondary | ICD-10-CM

## 2018-12-17 DIAGNOSIS — H18529 Epithelial (juvenile) corneal dystrophy, unspecified eye: Secondary | ICD-10-CM

## 2018-12-17 DIAGNOSIS — M797 Fibromyalgia: Secondary | ICD-10-CM

## 2018-12-17 MED ORDER — CYCLOBENZAPRINE HCL 5 MG PO TABS: ORAL_TABLET | ORAL | 0 refills | Status: DC

## 2018-12-17 MED ORDER — DICLOFENAC SODIUM 1 % TD GEL: TRANSDERMAL | 3 refills | Status: DC

## 2018-12-17 NOTE — Patient Instructions (Signed)

## 2019-03-15 ENCOUNTER — Other Ambulatory Visit: Payer: Self-pay | Admitting: Physician Assistant

## 2019-03-15 NOTE — Telephone Encounter (Signed)
Last Visit: 12/17/2018 Next Visit: 06/17/2019  Okay to refill per Dr. Deveshwar.  

## 2019-03-15 NOTE — Telephone Encounter (Signed)
ok 

## 2019-05-03 ENCOUNTER — Other Ambulatory Visit: Payer: Self-pay | Admitting: Rheumatology

## 2019-05-03 NOTE — Telephone Encounter (Signed)
Last Visit: 12/17/2018 Next Visit: 06/17/2019  Okay to refill per Dr. Estanislado Pandy.

## 2019-06-04 NOTE — Progress Notes (Deleted)
Office Visit Note  Patient: Emily Page             Date of Birth: 11-Feb-1951           MRN: [id number]             PCP: Juluis Rainier, MD Referring: Juluis Rainier, MD Visit Date: 06/17/2019 Occupation: @GUAROCC @  Subjective:  No chief complaint on file.   History of Present Illness: Emily Page is a 68 y.o. female ***   Activities of Daily Living:  Patient reports morning stiffness for *** {minute/hour:19697}.   Patient {ACTIONS;DENIES/REPORTS:21021675::"Denies"} nocturnal pain.  Difficulty dressing/grooming: {ACTIONS;DENIES/REPORTS:21021675::"Denies"} Difficulty climbing stairs: {ACTIONS;DENIES/REPORTS:21021675::"Denies"} Difficulty getting out of chair: {ACTIONS;DENIES/REPORTS:21021675::"Denies"} Difficulty using hands for taps, buttons, cutlery, and/or writing: {ACTIONS;DENIES/REPORTS:21021675::"Denies"}  No Rheumatology ROS completed.   PMFS History:  Patient Active Problem List   Diagnosis Date Noted  . Fibromyalgia 03/06/2017  . Sicca (HCC) 03/06/2017  . History of Raynaud's syndrome 03/06/2017  . History of total hip replacement, right 03/06/2017  . Vitamin D deficiency 03/06/2017  . History of migraine 03/06/2017  . Vasovagal syncope 03/06/2017  . 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 OSA (obstructive sleep apnea) 10/28/2016  . Excessive daytime sleepiness 04/22/2016  . Exertional shortness of breath 07/14/2014  . Palpitations 07/14/2014  . Abnormal nuclear stress test 07/14/2014  . Hay fever 04/14/2014  . Allergic rhinitis 04/14/2014  . Cervical pain 04/14/2014  . Fatigue 04/14/2014  . Acid reflux 04/14/2014  . Current drug use 04/14/2014  . Atypical migraine 04/14/2014  . Arthralgia of hand 04/14/2014  . Arthralgia of multiple joints 04/14/2014  . Paroxysmal digital cyanosis 04/14/2014  . Hypertension   . Chest pain, atypical 03/31/2014  . PVC (premature ventricular contraction) 03/31/2014  . Blood glucose  elevated 02/02/2014    Past Medical History:  Diagnosis Date  . Allergic rhinitis    followed by Dr Willa Rough in the past  . Fibromyalgia    followed by Dr Bedelia Person  . Foot pain, right    w intermittent swelling felt to be related to how she is walking on her foot. evaluated by Dr Leticia Penna (podiatrist)  . GERD (gastroesophageal reflux disease)    Followed by Dr Randa Evens in the past  . 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 History of EKG    w palpitations and nonspecific T-wave changes, stress cardiolite 6/08 showed normal LV size in a systolic function,no maximum effort stress test with moderate work load and good HR achieved,no EKG criteria of ischemia. Did have occassional PVC's  . Hypertension   . Migraines    followed [facility] past.   . OSA (obstructive sleep apnea) 10/28/2016   Very mild  . PVC (premature ventricular contraction)   . Raynaud's disease    followed by Dr Corliss Skains and norvasc has helped  . Rosacea    and seborrheic dermatitis followed by Dr Levy Sjogren    Family History  Problem Relation Age of Onset  . Hypertension Mother   . Heart attack Father   . Hypertension Father   . Hyperlipidemia Father    Past Surgical History:  Procedure Laterality Date  . CARDIAC CATHETERIZATION    . EYE SURGERY Left 10/2017   cataract extraction   . LEFT HEART CATHETERIZATION WITH CORONARY ANGIOGRAM N/A 07/28/2014   Procedure: LEFT HEART CATHETERIZATION WITH CORONARY ANGIOGRAM;  Surgeon: Marykay Lex, MD;  Location: [facility] CATH LAB;  Service: Cardiovascular;  Laterality: N/A;  .  SPLIT NIGHT STUDY  04/10/2016   Social History   Social History Narrative  . Not on file   Immunization History  Administered Date(s) Administered  . Influenza,inj,Quad PF,6+ Mos 08/24/2015     Objective: Vital Signs: There were no vitals taken for this visit.   Physical Exam   Musculoskeletal Exam: ***  CDAI Exam: CDAI Score: - Patient Global: -;  Provider Global: - Swollen: -; Tender: - Joint Exam   No joint exam has been documented for this visit   There is currently no information documented on the homunculus. Go to the Rheumatology activity and complete the homunculus joint exam.  Investigation: No additional findings.  Imaging: No results found.  Recent Labs: Lab Results  Component Value Date   WBC 7.6 07/21/2014   HGB 13.9 07/21/2014   PLT 250.0 07/21/2014   NA 139 04/24/2017   K 4.2 04/24/2017   CL 99 04/24/2017   CO2 23 04/24/2017   GLUCOSE 108 (H) 04/24/2017   BUN 8 04/24/2017   CREATININE 1.02 (H) 10/19/2018   CALCIUM 9.5 04/24/2017   GFRAA >60 10/19/2018    Speciality Comments: No specialty comments available.  Procedures:  No procedures performed Allergies: Amoxil [amoxicillin], Penicillins, Tape, Sulfa antibiotics, Zanaflex [tizanidine], and Polytrim [polymyxin b-trimethoprim]   Assessment / Plan:     Visit Diagnoses: No diagnosis found.  Orders: No orders of the defined types were placed in this encounter.  No orders of the defined types were placed in this encounter.   Face-to-face time spent with patient was *** minutes. Greater than 50% of time was spent in counseling and coordination of care.  Follow-Up Instructions: No follow-ups on file.   Ofilia Neas, PA-C  Note - This record has been created using Dragon software.  Chart creation errors have been sought, but may not always  have been located. Such creation errors do not reflect on  the standard of medical care.

## 2019-06-17 ENCOUNTER — Ambulatory Visit: Payer: Self-pay | Admitting: Physician Assistant

## 2019-06-18 NOTE — Progress Notes (Deleted)
Office Visit Note  Patient: Emily Page             Date of Birth: 1951-08-25           MRN: [id number]             PCP: Juluis Rainier, MD Referring: Juluis Rainier, MD Visit Date: 06/25/2019 Occupation: @GUAROCC @  Subjective:  No chief complaint on file.   History of Present Illness: Emily Page is a 68 y.o. female ***   Activities of Daily Living:  Patient reports morning stiffness for *** {minute/hour:19697}.   Patient {ACTIONS;DENIES/REPORTS:21021675::"Denies"} nocturnal pain.  Difficulty dressing/grooming: {ACTIONS;DENIES/REPORTS:21021675::"Denies"} Difficulty climbing stairs: {ACTIONS;DENIES/REPORTS:21021675::"Denies"} Difficulty getting out of chair: {ACTIONS;DENIES/REPORTS:21021675::"Denies"} Difficulty using hands for taps, buttons, cutlery, and/or writing: {ACTIONS;DENIES/REPORTS:21021675::"Denies"}  No Rheumatology ROS completed.   PMFS History:  Patient Active Problem List   Diagnosis Date Noted  . Fibromyalgia 03/06/2017  . Sicca (HCC) 03/06/2017  . History of Raynaud's syndrome 03/06/2017  . History of total hip replacement, right 03/06/2017  . Vitamin D deficiency 03/06/2017  . History of migraine 03/06/2017  . Vasovagal syncope 03/06/2017  . 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 OSA (obstructive sleep apnea) 10/28/2016  . Excessive daytime sleepiness 04/22/2016  . Exertional shortness of breath 07/14/2014  . Palpitations 07/14/2014  . Abnormal nuclear stress test 07/14/2014  . Hay fever 04/14/2014  . Allergic rhinitis 04/14/2014  . Cervical pain 04/14/2014  . Fatigue 04/14/2014  . Acid reflux 04/14/2014  . Current drug use 04/14/2014  . Atypical migraine 04/14/2014  . Arthralgia of hand 04/14/2014  . Arthralgia of multiple joints 04/14/2014  . Paroxysmal digital cyanosis 04/14/2014  . Hypertension   . Chest pain, atypical 03/31/2014  . PVC (premature ventricular contraction) 03/31/2014  . Blood glucose  elevated 02/02/2014    Past Medical History:  Diagnosis Date  . Allergic rhinitis    followed by Dr Willa Rough in the past  . Fibromyalgia    followed by Dr Bedelia Person  . Foot pain, right    w intermittent swelling felt to be related to how she is walking on her foot. evaluated by Dr Leticia Penna (podiatrist)  . GERD (gastroesophageal reflux disease)    Followed by Dr Randa Evens in the past  . 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 History of EKG    w palpitations and nonspecific T-wave changes, stress cardiolite 6/08 showed normal LV size in a systolic function,no maximum effort stress test with moderate work load and good HR achieved,no EKG criteria of ischemia. Did have occassional PVC's  . Hypertension   . Migraines    followed [facility] in past.   . OSA (obstructive sleep apnea) 10/28/2016   Very mild  . PVC (premature ventricular contraction)   . Raynaud's disease    followed by Dr Corliss Skains and norvasc has helped  . Rosacea    and seborrheic dermatitis followed by Dr Levy Sjogren    Family History  Problem Relation Age of Onset  . Hypertension Mother   . Heart attack Father   . Hypertension Father   . Hyperlipidemia Father    Past Surgical History:  Procedure Laterality Date  . CARDIAC CATHETERIZATION    . EYE SURGERY Left 10/2017   cataract extraction   . LEFT HEART CATHETERIZATION WITH CORONARY ANGIOGRAM N/A 07/28/2014   Procedure: LEFT HEART CATHETERIZATION WITH CORONARY ANGIOGRAM;  Surgeon: Marykay Lex, MD;  Location: [facility] CATH LAB;  Service: Cardiovascular;  Laterality: N/A;  .  SPLIT NIGHT STUDY  04/10/2016   Social History   Social History Narrative  . Not on file   Immunization History  Administered Date(s) Administered  . Influenza,inj,Quad PF,6+ Mos 08/24/2015     Objective: Vital Signs: There were no vitals taken for this visit.   Physical Exam   Musculoskeletal Exam: ***  CDAI Exam: CDAI Score: - Patient Global: -;  Provider Global: - Swollen: -; Tender: - Joint Exam   No joint exam has been documented for this visit   There is currently no information documented on the homunculus. Go to the Rheumatology activity and complete the homunculus joint exam.  Investigation: No additional findings.  Imaging: No results found.  Recent Labs: Lab Results  Component Value Date   WBC 7.6 07/21/2014   HGB 13.9 07/21/2014   PLT 250.0 07/21/2014   NA 139 04/24/2017   K 4.2 04/24/2017   CL 99 04/24/2017   CO2 23 04/24/2017   GLUCOSE 108 (H) 04/24/2017   BUN 8 04/24/2017   CREATININE 1.02 (H) 10/19/2018   CALCIUM 9.5 04/24/2017   GFRAA >60 10/19/2018    Speciality Comments: No specialty comments available.  Procedures:  No procedures performed Allergies: Amoxil [amoxicillin], Penicillins, Tape, Sulfa antibiotics, Zanaflex [tizanidine], and Polytrim [polymyxin b-trimethoprim]   Assessment / Plan:     Visit Diagnoses: Fibromyalgia  Raynaud's disease without gangrene  Sicca syndrome, unspecified ([place])  History of total hip replacement, right  History of vitamin D deficiency  History of migraine  History of hypertension  Anterior basement membrane dystrophy  Vasovagal syncope  Holmes-Adie syndrome, unspecified laterality  Orders: No orders of the defined types were placed in this encounter.  No orders of the defined types were placed in this encounter.   Face-to-face time spent with patient was *** minutes. Greater than 50% of time was spent in counseling and coordination of care.  Follow-Up Instructions: No follow-ups on file.   Ofilia Neas, PA-C  Note - This record has been created using Dragon software.  Chart creation errors have been sought, but may not always  have been located. Such creation errors do not reflect on  the standard of medical care.

## 2019-06-18 NOTE — Progress Notes (Signed)
Virtual Visit via Video Note  I connected with Emily Page on 06/18/19 at  1:30 PM EDT by a video enabled telemedicine application and verified that I am speaking with the correct person using two identifiers.  Location: Patient: Home Provider: Clinic  This service was conducted via virtual visit.  Both audio and visual tools were used.  The patient was located at home. I was located in my office.  Consent was obtained prior to the virtual visit and is aware of possible charges through their insurance for this visit.  The patient is an established patient.  Dr. Estanislado Pandy, MD conducted the virtual visit and Hazel Sams, PA-C acted as scribe during the service.  Office staff helped with scheduling follow up visits after the service was conducted.   I discussed the limitations of evaluation and management by telemedicine and the availability of in person appointments. The patient expressed understanding and agreed to proceed.  CC: Muscle tenderness   History of Present Illness: Patient is a 68 year old female with a past medical history of fibromyalgia, osteoarthritis, and Raynaud's. She continues to have generalized muscle aches and tenderness.  She has trapezius muscle tension and tenderness.  She performs stretching exercises regularly. She takes flexeril 5 mg 1/2 tablet po at bedtime for muscle spasms.  She has intermittent left knee joint pain. She uses voltaren gel topically as needed for pain relief, which has been effective.  She continues to have chronic sicca symptoms.  She has tried to increase her fluid intake.  She has intermittent symptoms of Raynaud's in her fingertips.  She has no ulcerations.      Review of Systems  Constitutional: Positive for malaise/fatigue. Negative for fever.  Eyes: Negative for photophobia, pain, discharge and redness.  Respiratory: Negative for cough, shortness of breath and wheezing.   Cardiovascular: Positive for palpitations. Negative for chest  pain.  Gastrointestinal: Negative for blood in stool, constipation and diarrhea.  Genitourinary: Negative for dysuria.  Musculoskeletal: Positive for back pain, joint pain, myalgias and neck pain.  Skin: Negative for rash.  Neurological: Negative for dizziness and headaches.  Psychiatric/Behavioral: Negative for depression. The patient has insomnia. The patient is not nervous/anxious.     Observations/Objective: Physical Exam  Constitutional: She is oriented to person, place, and time and well-developed, well-nourished, and in no distress.  HENT:  Head: Normocephalic and atraumatic.  Eyes: Conjunctivae are normal.  Pulmonary/Chest: Effort normal.  Neurological: She is alert and oriented to person, place, and time.  Psychiatric: Mood, memory, affect and judgment normal.   Patient reports morning stiffness for 15 minutes.   Patient denies nocturnal pain.  Difficulty dressing/grooming: Denies Difficulty climbing stairs: Denies Difficulty getting out of chair: Denies Difficulty using hands for taps, buttons, cutlery, and/or writing: Denies   Assessment and Plan: Visit Diagnoses: Fibromyalgia: She has generalized muscle aches and muscle tenderness due to fibromyalgia.  She has trapezius muscle aches and tenderness.  She has been performing stretching exercises on a regular basis.  She continues to take Flexeril 2.5 mg by mouth at bedtime to help with muscle spasms.  She has chronic fatigue related to insomnia.  Good sleep hygiene habits were discussed.  We discussed the importance of regular exercise.  She has been walking for exercise.  She will follow up in 6 months.   Raynaud's disease without gangrene: She has intermittent symptoms of Raynaud's in her fingertips.  She has no digital ulcerations or signs of gangrene. She was encouraged to keep her core  body temperature warm. We discussed the importance of wearing socks.  Avoiding triggers were discussed.  She is on Amlodipine 2.5 mg 1  tablet daily and aspirin 81 mg po daily. She was advised to notify us if she develops new or worsening symptoms.   Sicca syndrome, unspecified (HCC): She has chronic sicca symptoms.  She experiences mouth dryness at night.  She has tried to increase her fluid intake.   History of total hip replacement, right: Doing well.  She has no discomfort at this time.   History of vitamin D deficiency: She takes a calcium and vitamin D supplement daily.  Follow Up Instructions: She will follow up in 6 months.    I discussed the assessment and treatment plan with the patient. The patient was provided an opportunity to ask questions and all were answered. The patient agreed with the plan and demonstrated an understanding of the instructions.   The patient was advised to call back or seek an in-person evaluation if the symptoms worsen or if the condition fails to improve as anticipated.  I provided 25 minutes of non-face-to-face time during this encounter.  Pollyann Savoy, MD   Scribed by- Gearldine Bienenstock, PA-C

## 2019-06-25 ENCOUNTER — Encounter: Payer: Self-pay | Admitting: Rheumatology

## 2019-06-25 ENCOUNTER — Other Ambulatory Visit: Payer: Self-pay

## 2019-06-25 ENCOUNTER — Telehealth (INDEPENDENT_AMBULATORY_CARE_PROVIDER_SITE_OTHER): Payer: Medicare Other | Admitting: Rheumatology

## 2019-06-25 DIAGNOSIS — H1852 Epithelial (juvenile) corneal dystrophy: Secondary | ICD-10-CM | POA: Diagnosis not present

## 2019-06-25 DIAGNOSIS — Z8639 Personal history of other endocrine, nutritional and metabolic disease: Secondary | ICD-10-CM | POA: Diagnosis not present

## 2019-06-25 DIAGNOSIS — M797 Fibromyalgia: Secondary | ICD-10-CM

## 2019-06-25 DIAGNOSIS — R55 Syncope and collapse: Secondary | ICD-10-CM

## 2019-06-25 DIAGNOSIS — M3500 Sicca syndrome, unspecified: Secondary | ICD-10-CM | POA: Diagnosis not present

## 2019-06-25 DIAGNOSIS — I7300 Raynaud's syndrome without gangrene: Secondary | ICD-10-CM

## 2019-06-25 DIAGNOSIS — Z8669 Personal history of other diseases of the nervous system and sense organs: Secondary | ICD-10-CM

## 2019-06-25 DIAGNOSIS — Z96641 Presence of right artificial hip joint: Secondary | ICD-10-CM

## 2019-06-25 DIAGNOSIS — Z8679 Personal history of other diseases of the circulatory system: Secondary | ICD-10-CM | POA: Diagnosis not present

## 2019-06-25 DIAGNOSIS — H57059 Tonic pupil, unspecified eye: Secondary | ICD-10-CM

## 2019-06-25 DIAGNOSIS — H18529 Epithelial (juvenile) corneal dystrophy, unspecified eye: Secondary | ICD-10-CM

## 2019-06-30 ENCOUNTER — Telehealth: Payer: Self-pay | Admitting: Rheumatology

## 2019-06-30 NOTE — Telephone Encounter (Signed)
I LMOM for patient to call, and schedule next follow up appt around 12/30/2019.

## 2019-06-30 NOTE — Telephone Encounter (Signed)
-----   Message from Carole Binning, LPN sent at [phone]/82/9562  4:16 PM EDT ----- Patient had a virtual visit today. Please schedule patient for a follow up visit in 6 months.

## 2019-07-29 ENCOUNTER — Other Ambulatory Visit: Payer: Self-pay | Admitting: Rheumatology

## 2019-07-29 NOTE — Telephone Encounter (Signed)
Please schedule patient a follow up visit. Patient due January 2021. Thanks! 

## 2019-07-29 NOTE — Telephone Encounter (Signed)
Last Visit: 06/25/19 Next Visit: due January 2021. Message sent to the front to schedule.   Okay to refill per Dr. Deveshwar  

## 2019-07-29 NOTE — Telephone Encounter (Signed)
LMOM for patient to call and schedule 6 month follow-up appointment due in January 2021.

## 2019-08-30 ENCOUNTER — Telehealth: Payer: Self-pay | Admitting: Internal Medicine

## 2019-08-30 ENCOUNTER — Other Ambulatory Visit: Payer: Self-pay | Admitting: Pharmacist

## 2019-08-30 MED ORDER — METOPROLOL SUCCINATE ER 100 MG PO TB24: 100.0000 mg | ORAL_TABLET | Freq: Every day | ORAL | 0 refills | Status: DC

## 2019-08-30 MED ORDER — HYDRALAZINE HCL 25 MG PO TABS: 25.0000 mg | ORAL_TABLET | Freq: Two times a day (BID) | ORAL | 0 refills | Status: DC

## 2019-08-30 MED ORDER — LISINOPRIL 40 MG PO TABS: 40.0000 mg | ORAL_TABLET | Freq: Every day | ORAL | 0 refills | Status: DC

## 2019-08-30 NOTE — Telephone Encounter (Signed)
Spoke with Sherrill, EP scheduler and she said changing to virtual should be fine.  She will reach out to Dr. Odessa Fleming nurse.  Spoke with pt and changed appt and went over instructions for virtual visit using [email or web address].  Pt appreciative for call.      Virtual Visit Pre-Appointment Phone Call  "(Name), I am calling you today to discuss your upcoming appointment. We are currently trying to limit exposure to the virus that causes COVID-19 by seeing patients at home rather than in the office."  1. "What is the BEST phone number to call the day of the visit?" - include this in appointment notes  2. "Do you have or have access to (through a family member/friend) a smartphone with video capability that we can use for your visit?" a. If yes - list this number in appt notes as "cell" (if different from BEST phone #) and list the appointment type as a VIDEO visit in appointment notes b. If no - list the appointment type as a PHONE visit in appointment notes  3. Confirm consent - "In the setting of the current Covid19 crisis, you are scheduled for a (phone or video) visit with your provider on (date) at (time).  Just as we do with many in-office visits, in order for you to participate in this visit, we must obtain consent.  If you'd like, I can send this to your mychart (if signed up) or email for you to review.  Otherwise, I can obtain your verbal consent now.  All virtual visits are billed to your insurance company just like a normal visit would be.  By agreeing to a virtual visit, we'd like you to understand that the technology does not allow for your provider to perform an examination, and thus may limit your provider's ability to fully assess your condition. If your provider identifies any concerns that need to be evaluated in person, we will make arrangements to do so.  Finally, though the technology is pretty good, we cannot assure that it will always work on either your or our end, and in the setting of a  video visit, we may have to convert it to a phone-only visit.  In either situation, we cannot ensure that we have a secure connection.  Are you willing to proceed?" STAFF: Did the patient verbally acknowledge consent to telehealth visit? Document YES/NO here: YES  4. Advise patient to be prepared - "Two hours prior to your appointment, go ahead and check your blood pressure, pulse, oxygen saturation, and your weight (if you have the equipment to check those) and write them all down. When your visit starts, your provider will ask you for this information. If you have an Apple Watch or Kardia device, please plan to have heart rate information ready on the day of your appointment. Please have a pen and paper handy nearby the day of the visit as well."  5. Give patient instructions for MyChart download to smartphone OR Doximity/[email or web address] as below if video visit (depending on what platform provider is using)  6. Inform patient they will receive a phone call 15 minutes prior to their appointment time (may be from unknown caller ID) so they should be prepared to answer    TELEPHONE CALL NOTE  Emily Page has been deemed a candidate for a follow-up tele-health visit to limit community exposure during the Covid-19 pandemic. I spoke with the patient via phone to ensure availability of phone/video source, confirm preferred email & phone number, and  discuss instructions and expectations.  I reminded Emily Page to be prepared with any vital sign and/or heart rhythm information that could potentially be obtained via home monitoring, at the time of her visit. I reminded Emily Page to expect a phone call prior to her visit.  Loren Racer, RN 08/30/2019 11:50 AM   INSTRUCTIONS FOR DOWNLOADING THE MYCHART APP TO SMARTPHONE  - The patient must first make sure to have activated MyChart and know their login information - If Apple, go to [employer] and type in MyChart in the search bar and  download the app. If Android, ask patient to go to [employer] and type in [place] in the search bar and download the app. The app is free but as with any other app downloads, their phone may require them to verify saved payment information or Apple/Android password.  - The patient will need to then log into the app with their MyChart username and password, and select [employer] as their healthcare provider to link the account. When it is time for your visit, go to the MyChart app, find appointments, and click Begin Video Visit. Be sure to Select Allow for your device to access the Microphone and Camera for your visit. You will then be connected, and your provider will be with you shortly.  **If they have any issues connecting, or need assistance please contact MyChart service desk [phone])**  **If using a computer, in order to ensure the best quality for their visit they will need to use either of the following Internet Browsers: [employer], or Google Chrome**  IF USING DOXIMITY or DOXY.ME - The patient will receive a link just prior to their visit by text.     FULL LENGTH CONSENT FOR TELE-HEALTH VISIT   I hereby voluntarily request, consent and authorize [facility] and its employed or contracted physicians, physician assistants, nurse practitioners or other licensed health care professionals (the Practitioner), to provide me with telemedicine health care services (the "Services") as deemed necessary by the treating Practitioner. I acknowledge and consent to receive the Services by the Practitioner via telemedicine. I understand that the telemedicine visit will involve communicating with the Practitioner through live audiovisual communication technology and the disclosure of certain medical information by electronic transmission. I acknowledge that I have been given the opportunity to request an in-person assessment or other available alternative prior to the  telemedicine visit and am voluntarily participating in the telemedicine visit.  I understand that I have the right to withhold or withdraw my consent to the use of telemedicine in the course of my care at any time, without affecting my right to future care or treatment, and that the Practitioner or I may terminate the telemedicine visit at any time. I understand that I have the right to inspect all information obtained and/or recorded in the course of the telemedicine visit and may receive copies of available information for a reasonable fee.  I understand that some of the potential risks of receiving the Services via telemedicine include:  Marland Kitchen Delay or interruption in medical evaluation due to technological equipment failure or disruption; . Information transmitted may not be sufficient (e.g. poor resolution of images) to allow for appropriate medical decision making by the Practitioner; and/or  . In rare instances, security protocols could fail, causing a breach of personal health information.  Furthermore, I acknowledge that it is my responsibility to provide information about my medical history, conditions and care that is complete  and accurate to the best of my ability. I acknowledge that Practitioner's advice, recommendations, and/or decision may be based on factors not within their control, such as incomplete or inaccurate data provided by me or distortions of diagnostic images or specimens that may result from electronic transmissions. I understand that the practice of medicine is not an exact science and that Practitioner makes no warranties or guarantees regarding treatment outcomes. I acknowledge that I will receive a copy of this consent concurrently upon execution via email to the email address I last provided but may also request a printed copy by calling the office of [facility].    I understand that my insurance will be billed for this visit.   I have read or had this consent read to me.  . I understand the contents of this consent, which adequately explains the benefits and risks of the Services being provided via telemedicine.  . I have been provided ample opportunity to ask questions regarding this consent and the Services and have had my questions answered to my satisfaction. . I give my informed consent for the services to be provided through the use of telemedicine in my medical care  By participating in this telemedicine visit I agree to the above.

## 2019-08-30 NOTE — Telephone Encounter (Signed)
New Message   Patient has medical issues and not safe for her to come out at this time but she has an appointment scheduled for 09/03/19 @ 9:45am with Dr. Caryl Comes and wants to know if she could do it virtually.  Please call patient back to discuss.

## 2019-08-30 NOTE — Telephone Encounter (Signed)
Hydralazine, metoprolol, lisinopril 90 days supply sent Patient hasn't been seen in > 1 year. Patient made aware she needs appointment for more refills

## 2019-09-02 NOTE — Progress Notes (Signed)
Electrophysiology TeleHealth Note   Due to national recommendations of social distancing due to COVID 19, an audio/video telehealth visit is felt to be most appropriate for this patient at this time.  See MyChart message from today for the patient's consent to telehealth for [facility].   Date:  09/03/2019   ID:  Emily Page, DOB 08-26-1951, MRN [id number]  Location: patient's home  Provider location: [address] [place]  Evaluation Performed: Follow-up visit  PCP:  Juluis Rainier, MD  Cardiologist:     Electrophysiologist:  SK   Chief Complaint:  palpitations  History of Present Illness:    Emily Page is a 68 y.o. female who presents via audio/video conferencing for a telehealth visit today. The patient did not have access to video technology/had technical difficulties with video requiring transitioning to audio format only (telephone).  All issues noted in this document were discussed and addressed.  No physical exam could be performed with this format.     Since last being seen in our clinic for dysautonomia the patient reports things are stable  They are stereotypical associated with multiple epiphenomena aggravated by heat and showers and prolonged standing relieved by rest- fluid replete   BP still mildly elevated   These occur not withstanding the context of an abnormal heart muscle as outlined below  She continues to complain of tachypalpitations. These are aggravated by exertion but not always so. She has tachypalpitations also when lying down and had recordings from her FitBit where her heart rates will go into the 130s while lying. She previously brought in Alive[employer] tracings showing PVCs as well as junctional extrasystoles.  Ongoing palpitations-- notable in bed-- caffeine user but no notes association   The patient denies chest pain, nocturnal dyspnea, orthopnea or peripheral edema.  There have been no palpitations,  lightheadedness or syncope.    Some shortness of breath-- O2 sats are ok    DATE TEST    8/15    Echo 45-50   9/15/     Cath   EF  % Normal CA  11/16 cMRI EF 47% No LGE      CPX 11/16 FVC 2.83 (100%)  FEV1 2.13 (94%)  FEV1/FVC 75 (96%)   The patient denies symptoms of fevers, chills, cough, or new SOB worrisome for COVID 19.    Past Medical History:  Diagnosis Date  . Allergic rhinitis    followed by Dr Willa Rough in the past  . Fibromyalgia    followed by Dr Bedelia Person  . Foot pain, right    w intermittent swelling felt to be related to how she is walking on her foot. evaluated by Dr Leticia Penna (podiatrist)  . GERD (gastroesophageal reflux disease)    Followed by Dr Randa Evens in the past  . 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 History of EKG    w palpitations and nonspecific T-wave changes, stress cardiolite 6/08 showed normal LV size in a systolic function,no maximum effort stress test with moderate work load and good HR achieved,no EKG criteria of ischemia. Did have occassional PVC's  . Hypertension   . Migraines    [facility].   . OSA (obstructive sleep apnea) 10/28/2016   Very mild  . PVC (premature ventricular contraction)   . Raynaud's disease    followed by Dr Corliss Skains and norvasc has helped  . Rosacea    and seborrheic dermatitis followed  by Dr Levy Sjogren    Past Surgical History:  Procedure Laterality Date  . CARDIAC CATHETERIZATION    . EYE SURGERY Left 10/2017   cataract extraction   . LEFT HEART CATHETERIZATION WITH CORONARY ANGIOGRAM N/A 07/28/2014   Procedure: LEFT HEART CATHETERIZATION WITH CORONARY ANGIOGRAM;  Surgeon: Marykay Lex, MD;  Location: [facility] CATH LAB;  Service: Cardiovascular;  Laterality: N/A;  . SPLIT NIGHT STUDY  04/10/2016    Current Outpatient Medications  Medication Sig Dispense Refill  . amLODipine (NORVASC) 2.5 MG tablet Take 1 tablet (2.5 mg total) by mouth  daily. 30 tablet 11  . aspirin EC 81 MG tablet Take 81 mg by mouth every evening.    Marland Kitchen azelastine (ASTELIN) 137 MCG/SPRAY nasal spray Place 2 sprays into both nostrils daily as needed for rhinitis or allergies. Use in each nostril as directed    . CALCIUM PO Take 600 mg by mouth daily.     . Cholecalciferol (VITAMIN D) 125 MCG (5000 UT) CAPS Take 1 capsule by mouth daily.    . cyclobenzaprine (FLEXERIL) 5 MG tablet Take 2.5 mg by mouth at bedtime as needed for muscle spasms.    . diclofenac sodium (VOLTAREN) 1 % GEL Apply 3 grams to 3 large joints up to 3 times daily 3 Tube 3  . esomeprazole (NEXIUM) 40 MG capsule Take 40 mg by mouth daily at 12 noon.    Marland Kitchen estradiol (ESTRACE) 2 MG tablet Take 2 mg by mouth daily.    . fluconazole (DIFLUCAN) 150 MG tablet Take 150 mg by mouth daily as needed (for yeast infection).     . hydrALAZINE (APRESOLINE) 25 MG tablet Take 1 tablet (25 mg total) by mouth 2 (two) times daily. 180 tablet 0  . INVELTYS 1 % SUSP INSTILL 1 DROP IN EYE(S) 3 TIMES A DAY FOR 2 WEEKS  1  . ketoconazole (NIZORAL) 2 % cream Apply 1 application topically daily.    Marland Kitchen ketoconazole (NIZORAL) 2 % shampoo Apply 1 application topically daily.     Marland Kitchen lisinopril (ZESTRIL) 40 MG tablet Take 1 tablet (40 mg total) by mouth daily. 90 tablet 0  . Magnesium Malate 1250 (141.7 Mg) MG TABS Take 2 tablets by mouth daily.    . metoprolol succinate (TOPROL-XL) 100 MG 24 hr tablet Take 1 tablet (100 mg total) by mouth daily. 90 tablet 0  . nystatin-triamcinolone ointment (MYCOLOG) Apply 1 application topically daily as needed (to rash).     . Probiotic Product (PROBIOTIC PO) Take by mouth daily.    . metroNIDAZOLE (METROCREAM) 0.75 % cream Apply 1 application topically 2 (two) times daily.     No current facility-administered medications for this visit.     Allergies:   Amoxil [amoxicillin], Penicillins, Tape, Sulfa antibiotics, Zanaflex [tizanidine], and Polytrim [polymyxin b-trimethoprim]   Social  History:  The patient  reports that she has never smoked. She has never used smokeless tobacco. She reports current alcohol use of about 7.0 standard drinks of alcohol per week. She reports that she does not use drugs.   Family History:  The patient's   family history includes Heart attack in her father; Hyperlipidemia in her father; Hypertension in her father and mother.   ROS:  Please see the history of present illness.   All other systems are personally reviewed and negative.    Exam:    Vital Signs:  BP (!) 160/83   Pulse 64   Ht 5\' 2"  (1.575 m)   Wt 128  lb (58.1 kg)   BMI 23.41 kg/m       Labs/Other Tests and Data Reviewed:    Recent Labs: 10/19/2018: Creatinine, Ser 1.02   Wt Readings from Last 3 Encounters:  09/03/19 128 lb (58.1 kg)  12/17/18 137 lb 6.4 oz (62.3 kg)  08/11/18 135 lb 9.6 oz (61.5 kg)     Other studies personally reviewed: Additional studies/ records that were reviewed today include:    Review of the above records today demonstrates:    Prior radiographs:      ASSESSMENT & PLAN:    Dysautonomia  Abnormal ECG  Cardiomyopathy  Hypertension-severe   PVCs  Tachycardia  Discussed role of compressive wear for orthostasis  Will try and use pulse oximeter o identify association of symptoms with rhythm and also to check BP with exercise   COVID 19 screen The patient denies symptoms of COVID 19 at this time.  The importance of social distancing was discussed today.  Follow-up:  10-12 weeks     Current medicines are reviewed at length with the patient today.   The patient does not have concerns regarding her medicines.  The following changes were made today:  none  Labs/ tests ordered today include:   No orders of the defined types were placed in this encounter.   Future tests ( post COVID )     Patient Risk:  after full review of this patients clinical status, I feel that they are at moderate  risk at this time.  Today, I have  spent 8 minutes with the patient with telehealth technology discussing the above.  Signed, Virl Axe, MD  09/03/2019 10:52 AM     [facility] [address] [place] [address] [phone] (office) [phone] (fax)

## 2019-09-03 ENCOUNTER — Telehealth (INDEPENDENT_AMBULATORY_CARE_PROVIDER_SITE_OTHER): Payer: Medicare Other | Admitting: Internal Medicine

## 2019-09-03 ENCOUNTER — Encounter: Payer: Self-pay | Admitting: Internal Medicine

## 2019-09-03 ENCOUNTER — Other Ambulatory Visit: Payer: Self-pay

## 2019-09-03 VITALS — BP 160/83 | HR 64 | Ht 62.0 in | Wt 128.0 lb

## 2019-09-03 DIAGNOSIS — I493 Ventricular premature depolarization: Secondary | ICD-10-CM | POA: Diagnosis not present

## 2019-09-03 DIAGNOSIS — G901 Familial dysautonomia [Riley-Day]: Secondary | ICD-10-CM

## 2019-09-24 DIAGNOSIS — Z23 Encounter for immunization: Secondary | ICD-10-CM | POA: Diagnosis not present

## 2019-09-25 ENCOUNTER — Other Ambulatory Visit: Payer: Self-pay | Admitting: Rheumatology

## 2019-09-27 ENCOUNTER — Encounter: Payer: Self-pay | Admitting: *Deleted

## 2019-09-27 NOTE — Telephone Encounter (Signed)
Last Visit: 06/25/19 Next Visit: due January 2021. Message sent to patient via my chart.  Okay to refill per Dr. Estanislado Pandy

## 2019-10-14 ENCOUNTER — Other Ambulatory Visit: Payer: Self-pay | Admitting: Pharmacist

## 2019-10-14 MED ORDER — AMLODIPINE BESYLATE 2.5 MG PO TABS: 2.5000 mg | ORAL_TABLET | Freq: Every day | ORAL | 3 refills | Status: DC

## 2019-10-28 ENCOUNTER — Telehealth: Payer: Medicare Other | Admitting: Internal Medicine

## 2019-11-12 ENCOUNTER — Other Ambulatory Visit: Payer: Self-pay | Admitting: Cardiology

## 2019-11-15 ENCOUNTER — Other Ambulatory Visit: Payer: Self-pay | Admitting: Rheumatology

## 2019-11-15 NOTE — Telephone Encounter (Signed)
Last Visit: 06/25/2019 Next Visit: message sent to the front desk to schedule.   Okay to refill per Dr. Estanislado Pandy.

## 2019-11-30 ENCOUNTER — Telehealth: Payer: Self-pay | Admitting: Rheumatology

## 2019-11-30 NOTE — Telephone Encounter (Signed)
I LMOM for patient to call, and schedule a follow up appointment in January 2021.

## 2019-11-30 NOTE — Telephone Encounter (Signed)
-----   Message from Ellen Henri, CMA sent at 11/15/2019  3:33 PM EST ----- Patient due for an appointment in January 2021. Thanks!

## 2019-12-08 ENCOUNTER — Other Ambulatory Visit: Payer: Self-pay | Admitting: Cardiology

## 2019-12-08 DIAGNOSIS — G901 Familial dysautonomia [Riley-Day]: Secondary | ICD-10-CM | POA: Insufficient documentation

## 2019-12-08 DIAGNOSIS — I429 Cardiomyopathy, unspecified: Secondary | ICD-10-CM | POA: Insufficient documentation

## 2019-12-08 DIAGNOSIS — R000 Tachycardia, unspecified: Secondary | ICD-10-CM | POA: Insufficient documentation

## 2019-12-09 ENCOUNTER — Other Ambulatory Visit: Payer: Self-pay

## 2019-12-09 ENCOUNTER — Telehealth: Payer: Medicare Other | Admitting: Internal Medicine

## 2019-12-22 ENCOUNTER — Telehealth: Payer: Medicare Other | Admitting: Internal Medicine

## 2019-12-23 ENCOUNTER — Telehealth (INDEPENDENT_AMBULATORY_CARE_PROVIDER_SITE_OTHER): Payer: Medicare Other | Admitting: Internal Medicine

## 2019-12-23 ENCOUNTER — Other Ambulatory Visit: Payer: Self-pay

## 2019-12-23 VITALS — BP 146/73 | HR 55 | Ht 63.0 in | Wt 130.0 lb

## 2019-12-23 DIAGNOSIS — R000 Tachycardia, unspecified: Secondary | ICD-10-CM | POA: Diagnosis not present

## 2019-12-23 DIAGNOSIS — G901 Familial dysautonomia [Riley-Day]: Secondary | ICD-10-CM | POA: Diagnosis not present

## 2019-12-23 DIAGNOSIS — E039 Hypothyroidism, unspecified: Secondary | ICD-10-CM | POA: Diagnosis not present

## 2019-12-23 DIAGNOSIS — I493 Ventricular premature depolarization: Secondary | ICD-10-CM

## 2019-12-23 DIAGNOSIS — I429 Cardiomyopathy, unspecified: Secondary | ICD-10-CM

## 2019-12-23 NOTE — Progress Notes (Signed)
Electrophysiology TeleHealth Note   Due to national recommendations of social distancing due to COVID 19, an audio/video telehealth visit is felt to be most appropriate for this patient at this time.  See MyChart message from today for the patient's consent to telehealth for [facility].   Date:  12/23/2019   ID:  Emily Page, DOB [birth year], MRN [id number]  Location: patient's home  Provider location: [address] [place]  Evaluation Performed: Follow-up visit  PCP:  Juluis Rainier, MD  Cardiologist:     Electrophysiologist:  SK   Chief Complaint:   palpitations  History of Present Illness:    Emily Page is a 69 y.o. female who presents via audio/video conferencing for a telehealth visit today.  Since last being seen in our clinic forstereotypical  palpitations  and dysautonomia, orthostatic lightheadedness but without demonstrable hypotension,   the patient reports still having LH with standing, compressive wear has helped with syncope, but overall feels bad with increased HR can be assoc w dyspnea.  Also DOE, without PND, orthopnea.  Some edema, no assoc with dyspnea.    Sleep disordered breathing-- without daytime somnolence-  Has spells with weakness with HR >>130  Duration aobut 30 min, and sometime   DATE TEST EF   8/15 Echo 45-50   9/15/  Cath   Normal CA  11/16 cMRI  47% No LGE  6/18 Echo  50-55%    Monitoring in the past has also shown PVC and junctional extrasystoles  CPX 11/16 FVC 2.83 (100%)  FEV1 2.13 (94%)  FEV1/FVC 75 (96%)     The patient denies symptoms of fevers, chills, cough, or new SOB worrisome for COVID 19.   Past Medical History:  Diagnosis Date   Allergic rhinitis    followed by Dr Willa Rough in the past   Fibromyalgia    followed by Dr Bedelia Person   Foot pain, right    w intermittent swelling felt to be related to how she is walking on her foot. 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 Did have occassional PVC's   Hypertension    Migraines    [facility].    OSA (obstructive sleep apnea) 10/28/2016   Very mild   PVC (premature ventricular contraction)    Raynaud's disease    followed by Dr Corliss Skains and norvasc has helped   Rosacea    and seborrheic dermatitis followed by Dr Levy Sjogren    Past Surgical History:  Procedure Laterality Date   CARDIAC CATHETERIZATION     EYE SURGERY Left 10/2017   cataract extraction    LEFT HEART CATHETERIZATION WITH CORONARY ANGIOGRAM N/A 07/28/2014   Procedure: LEFT HEART CATHETERIZATION WITH CORONARY ANGIOGRAM;  Surgeon: Marykay Lex, MD;  Location[facility] CATH LAB;  Service: Cardiovascular;  Laterality: N/A;   SPLIT NIGHT STUDY  04/10/2016    Current Outpatient Medications  Medication Sig Dispense Refill   amLODipine (NORVASC) 2.5 MG tablet Take 1 tablet (2.5 mg total) by mouth daily. 90 tablet 3   aspirin EC 81  MG tablet Take 81 mg by mouth every evening.     CALCIUM PO Take 600 mg by mouth daily.      Cholecalciferol (VITAMIN D) 125 MCG (5000 UT) CAPS Take 1 capsule by mouth daily.     cyclobenzaprine (FLEXERIL) 5 MG tablet TAKE 1/2 TABLET (2.5 MG TOTAL) BY MOUTH AT BEDTIME. 30 tablet 0   diclofenac sodium (VOLTAREN) 1 % GEL Apply 3 grams to 3 large joints up to 3 times daily 3 Tube 3   esomeprazole (NEXIUM) 40 MG capsule Take 40 mg by mouth daily at 12 noon.     estradiol (ESTRACE) 2 MG tablet Take 2 mg by mouth daily.     fluconazole (DIFLUCAN) 150 MG tablet Take 150 mg by mouth daily as needed (for  yeast infection).      hydrALAZINE (APRESOLINE) 25 MG tablet Take 1 tablet (25 mg total) by mouth 2 (two) times daily. 180 tablet 2   INVELTYS 1 % SUSP INSTILL 1 DROP IN EYE(S) 3 TIMES A DAY FOR 2 WEEKS  1   ketoconazole (NIZORAL) 2 % cream Apply 1 application topically daily.     ketoconazole (NIZORAL) 2 % shampoo Apply 1 application topically daily.      lisinopril (ZESTRIL) 40 MG tablet Take 1 tablet (40 mg total) by mouth daily. Please call to schedule appointment for further refills [phone] 90 tablet 0   Magnesium Malate 1250 (141.7 Mg) MG TABS Take 2 tablets by mouth daily.     metoprolol succinate (TOPROL-XL) 100 MG 24 hr tablet Take 1 tablet (100 mg total) by mouth daily. 90 tablet 2   metroNIDAZOLE (METROCREAM) 0.75 % cream Apply 1 application topically 2 (two) times daily.     nystatin-triamcinolone ointment (MYCOLOG) Apply 1 application topically daily as needed (to rash).      Probiotic Product (PROBIOTIC PO) Take by mouth daily.     azelastine (ASTELIN) 137 MCG/SPRAY nasal spray Place 2 sprays into both nostrils daily as needed for rhinitis or allergies. Use in each nostril as directed     No current facility-administered medications for this visit.    Allergies:   Amoxil [amoxicillin], Penicillins, Tape, Sulfa antibiotics, Zanaflex [tizanidine], and Polytrim [polymyxin b-trimethoprim]   Social History:  The patient  reports that she has never smoked. She has never used smokeless tobacco. She reports current alcohol use of about 7.0 standard drinks of alcohol per week. She reports that she does not use drugs.   Family History:  The patient's   family history includes Heart attack in her father; Hyperlipidemia in her father; Hypertension in her father and mother.   ROS:  Please see the history of present illness.   All other systems are personally reviewed and negative.    Exam:    Vital Signs:  BP (!) 146/73    Pulse (!) 55    Ht 5\' 3"  (1.6 m)    Wt 130 lb (59  kg)    BMI 23.03 kg/m     Well appearing, alert and conversant, regular work of breathing,  good skin color Eyes- anicteric, neuro- grossly intact, skin- no apparent rash or lesions or cyanosis, mouth- oral mucosa is pink   Labs/Other Tests and Data Reviewed:    Recent Labs: No results found for requested labs within last 8760 hours.   Wt Readings from Last 3 Encounters:  12/23/19 130 lb (59 kg)  09/03/19 128 lb (58.1 kg)  12/17/18 137 lb 6.4 oz (62.3 kg)  Other studies personally reviewed:    ASSESSMENT & PLAN:    Orthostatic intolerance  Dyspnea  Palpitations  Cardiomyopathy mild  Continues to have limiting symptoms, esp w standing and assoc with rapid HR--? Maybe assoc with low BP-- will have her use alivecor and measure BP abd HR with these events and a camera to see herself  EF has been gradually improvings  Symptoms do not suggest OSA,  Will check CBC TSH and BMET    COVID 19 screen The patient denies symptoms of COVID 19 at this time.  The importance of social distancing was discussed today.  Follow-up: 65m [facility]    Current medicines are reviewed at length with the patient today.   The patient does not have concerns regarding her medicines.  The following changes were made today:  none  Labs/ tests ordered today include: As above  No orders of the defined types were placed in this encounter.   Future tests ( post COVID )     Patient Risk:  after full review of this patients clinical status, I feel that they are at moderate risk at this time.  Today, I have spent 22* minutes with the patient with telehealth technology discussing the above.  Signed, Sherryl Manges, MD  12/23/2019 2:38 PM     [facility] [address] [place] [address] [phone] (office) [phone] (fax)

## 2019-12-27 DIAGNOSIS — Z0000 Encounter for general adult medical examination without abnormal findings: Secondary | ICD-10-CM | POA: Diagnosis not present

## 2019-12-27 DIAGNOSIS — I119 Hypertensive heart disease without heart failure: Secondary | ICD-10-CM | POA: Diagnosis not present

## 2019-12-27 DIAGNOSIS — Z13220 Encounter for screening for lipoid disorders: Secondary | ICD-10-CM | POA: Diagnosis not present

## 2019-12-27 DIAGNOSIS — Z79899 Other long term (current) drug therapy: Secondary | ICD-10-CM | POA: Diagnosis not present

## 2019-12-27 DIAGNOSIS — K219 Gastro-esophageal reflux disease without esophagitis: Secondary | ICD-10-CM | POA: Diagnosis not present

## 2019-12-27 DIAGNOSIS — I10 Essential (primary) hypertension: Secondary | ICD-10-CM | POA: Diagnosis not present

## 2019-12-27 DIAGNOSIS — M797 Fibromyalgia: Secondary | ICD-10-CM | POA: Diagnosis not present

## 2019-12-27 DIAGNOSIS — I7300 Raynaud's syndrome without gangrene: Secondary | ICD-10-CM | POA: Diagnosis not present

## 2019-12-27 DIAGNOSIS — E559 Vitamin D deficiency, unspecified: Secondary | ICD-10-CM | POA: Diagnosis not present

## 2020-01-02 ENCOUNTER — Other Ambulatory Visit: Payer: Self-pay | Admitting: Rheumatology

## 2020-01-03 ENCOUNTER — Ambulatory Visit: Payer: Medicare Other | Attending: Internal Medicine

## 2020-01-03 DIAGNOSIS — Z23 Encounter for immunization: Secondary | ICD-10-CM

## 2020-01-03 NOTE — Telephone Encounter (Signed)
Last Visit: 06/25/2019 telemedicine  Next Visit: message sent to the front desk to schedule.   Okay to refill per Dr. Deveshwar.     

## 2020-01-03 NOTE — Progress Notes (Signed)
   Covid-19 Vaccination Clinic  Name:  Emily Page    MRN: [id number] DOB: Oct 11, 1951  01/03/2020  Ms. Emily Page was observed post Covid-19 immunization for 15 minutes without incidence. She was provided with Vaccine Information Sheet and instruction to access the V-Safe system.   Ms. Emily Page was instructed to call 911 with any severe reactions post vaccine: Marland Kitchen Difficulty breathing  . Swelling of your face and throat  . A fast heartbeat  . A bad rash all over your body  . Dizziness and weakness    Immunizations Administered    Name Date Dose VIS Date Route   Pfizer COVID-19 Vaccine 01/03/2020  4:12 PM 0.3 mL 11/05/2019 Intramuscular   Manufacturer: [employer], [employer]   Lot: [id number]   NDC: [address]-6

## 2020-01-21 NOTE — Progress Notes (Signed)
Virtual Visit via Video Note  I connected with Emily Page on 01/25/20 at  3:45 PM EST by a video enabled telemedicine application and verified that I am speaking with the correct person using two identifiers.  Location: Patient: Home Provider: Clinic  This service was conducted via virtual visit.  Both audio and visual tools were used.  The patient was located at home. I was located in my office.  Consent was obtained prior to the virtual visit and is aware of possible charges through their insurance for this visit.  The patient is an established patient.  Dr. Estanislado Pandy, MD conducted the virtual visit and Hazel Sams, PA-C acted as scribe during the service.  Office staff helped with scheduling follow up visits after the service was conducted.     I discussed the limitations of evaluation and management by telemedicine and the availability of in person appointments. The patient expressed understanding and agreed to proceed.  CC: Back pain  History of Present Illness: Patient is a 69 year old female with a past medical history of fibromyalgia, Raynaud's disease, and sicca syndrome. She states her pain due to fibromyalgia has been tolerable.  She continues to have intermittent lower back pain.  She denies any symptoms of radiculopathy.  She continues to take flexeril 2.5 mg po at bedtime for muscle spasms.  She continues to have intermittent symptoms of Raynaud's.  She takes Norvasc 2.5 mg by mouth daily.  She denies any digital ulcerations.  She continues to have chronic sicca symptoms and her eye dryness has been tolerable recently.  She states that her right hip replacement is doing well without any discomfort.  She continues to have chronic fatigue secondary to insomnia.  She has noted some increased shortness of breath with exertion and plans on following up with her PCP.  She states that she checks her pulse ox at home which has been normal.   Review of Systems  Constitutional: Positive  for malaise/fatigue. Negative for fever.  HENT: Negative for congestion.   Eyes: Positive for pain. Negative for photophobia, discharge and redness.  Respiratory: Negative for cough and wheezing.   Cardiovascular: Negative for chest pain, palpitations and leg swelling.  Gastrointestinal: Negative for blood in stool, constipation and diarrhea.  Genitourinary: Negative for dysuria and frequency.  Musculoskeletal: Positive for back pain and joint pain. Negative for myalgias and neck pain.  Skin: Negative for rash.  Neurological: Negative for dizziness, weakness and headaches.  Endo/Heme/Allergies: Does not bruise/bleed easily.  Psychiatric/Behavioral: Negative for depression and memory loss. The patient is not nervous/anxious and does not have insomnia.       Observations/Objective: Physical Exam  Constitutional: She is oriented to person, place, and time and well-developed, well-nourished, and in no distress.  HENT:  Head: Normocephalic and atraumatic.  Eyes: Conjunctivae are normal.  Pulmonary/Chest: Effort normal.  Neurological: She is alert and oriented to person, place, and time.  Psychiatric: Mood, memory, affect and judgment normal.   Patient reports morning stiffness for 0 NONE.   Patient reports nocturnal pain.  Difficulty dressing/grooming: Denies Difficulty climbing stairs: Denies Difficulty getting out of chair: Denies Difficulty using hands for taps, buttons, cutlery, and/or writing: Denies   Assessment and Plan: Visit Diagnoses: Fibromyalgia: She has intermittent muscle aches and muscle tenderness due to fibromyalgia.  Her discomfort has been tolerable recently. She continues to take Flexeril 2.5 mg by mouth at bedtime as needed for muscle spasms.  She uses Voltaren gel topically as needed for pain relief.  She is been experiencing increased lower back pain but has not had any symptoms of radiculopathy.  We will mail her a handout of back exercises to perform.  We  discussed the importance of regular exercise and good sleep hygiene.  She will follow-up in 6 months  Raynaud's disease without gangrene:  She has intermittent symptoms of Raynaud's.  No digital ulcerations or signs of gangrene.  She is taking Norvasc 2.5 mg 1 tablet by mouth daily.  She continues to take aspirin 81 mg 1 tablet by mouth daily.  She is advised to notify us if she develops any new or worsening symptoms.  Sicca syndrome, unspecified (HCC):  She has chronic sicca symptoms which have been tolerable.  She uses eyedrops on a regular basis for symptomatic relief.  History of total hip replacement, right: Doing well.    She has no discomfort at this time.  Acute pain of left knee:  She declined x-rays of the left knee joint in the past.  She was provided a prescription for a left knee joint brace at her last office visit.  Her left knee joint pain has resolved.  She feels that her discomfort is radiating from her back.  We will send her a handout of back exercises to perform.  Chronic midline low back pain without sciatica: She is been experiencing increased lower back pain over the past several months.  Her discomfort has been midline and she has not had any radiating pain or radiculopathy.  We will mail her a handout of back exercises to perform.  She was advised to notify us if her back pain persist or worsen and she can return for x-rays of her lumbar spine.  History of vitamin D deficiency: She takes a calcium and vitamin D supplement daily.  Other medical conditions are listed as follows  History of migraine  History of hypertension  Anterior basement membrane dystrophy: She has no eye pain, eye dryness, or photophobia at this time.  She has not had any recent vision changes.  Follow Up Instructions: She will follow up in 6 months.   I discussed the assessment and treatment plan with the patient. The patient was provided an opportunity to ask questions and all were  answered. The patient agreed with the plan and demonstrated an understanding of the instructions.   The patient was advised to call back or seek an in-person evaluation if the symptoms worsen or if the condition fails to improve as anticipated.  I provided 20 minutes of non-face-to-face time during this encounter.   Pollyann Savoy, MD   Scribed byLadona Ridgel Dale,PA-C

## 2020-01-25 ENCOUNTER — Encounter: Payer: Self-pay | Admitting: Rheumatology

## 2020-01-25 ENCOUNTER — Other Ambulatory Visit: Payer: Self-pay

## 2020-01-25 ENCOUNTER — Telehealth (INDEPENDENT_AMBULATORY_CARE_PROVIDER_SITE_OTHER): Payer: Medicare Other | Admitting: Rheumatology

## 2020-01-25 DIAGNOSIS — H57059 Tonic pupil, unspecified eye: Secondary | ICD-10-CM | POA: Diagnosis not present

## 2020-01-25 DIAGNOSIS — G8929 Other chronic pain: Secondary | ICD-10-CM

## 2020-01-25 DIAGNOSIS — I7300 Raynaud's syndrome without gangrene: Secondary | ICD-10-CM

## 2020-01-25 DIAGNOSIS — Z8669 Personal history of other diseases of the nervous system and sense organs: Secondary | ICD-10-CM | POA: Diagnosis not present

## 2020-01-25 DIAGNOSIS — H18529 Epithelial (juvenile) corneal dystrophy, unspecified eye: Secondary | ICD-10-CM

## 2020-01-25 DIAGNOSIS — M545 Low back pain: Secondary | ICD-10-CM | POA: Diagnosis not present

## 2020-01-25 DIAGNOSIS — M3500 Sicca syndrome, unspecified: Secondary | ICD-10-CM | POA: Diagnosis not present

## 2020-01-25 DIAGNOSIS — Z8679 Personal history of other diseases of the circulatory system: Secondary | ICD-10-CM

## 2020-01-25 DIAGNOSIS — Z8639 Personal history of other endocrine, nutritional and metabolic disease: Secondary | ICD-10-CM

## 2020-01-25 DIAGNOSIS — M797 Fibromyalgia: Secondary | ICD-10-CM

## 2020-01-25 DIAGNOSIS — Z96641 Presence of right artificial hip joint: Secondary | ICD-10-CM | POA: Diagnosis not present

## 2020-01-26 NOTE — Patient Instructions (Signed)

## 2020-01-28 ENCOUNTER — Ambulatory Visit: Payer: Medicare Other | Attending: Internal Medicine

## 2020-01-28 DIAGNOSIS — Z23 Encounter for immunization: Secondary | ICD-10-CM

## 2020-01-28 NOTE — Progress Notes (Signed)
   Covid-19 Vaccination Clinic  Name:  SHYLAH Page    MRN: [id number] DOB: 07/09/1951  01/28/2020  [name] was observed post Covid-19 immunization for 30 minutes based on pre-vaccination screening without incident. She was provided with Vaccine Information Sheet and instruction to access the V-Safe system.   [name] was instructed to call 911 with any severe reactions post vaccine: Marland Kitchen Difficulty breathing  . Swelling of face and throat  . A fast heartbeat  . A bad rash all over body  . Dizziness and weakness   Immunizations Administered    Name Date Dose VIS Date Route   Pfizer COVID-19 Vaccine 01/28/2020  2:07 PM 0.3 mL 11/05/2019 Intramuscular   Manufacturer: [employer], [employer]   Lot: [id number]   NDC: [address]-2

## 2020-01-30 ENCOUNTER — Other Ambulatory Visit: Payer: Self-pay | Admitting: Cardiology

## 2020-03-14 ENCOUNTER — Other Ambulatory Visit: Payer: Self-pay | Admitting: Rheumatology

## 2020-03-14 NOTE — Telephone Encounter (Signed)
Last Visit: 01/25/2020 telemedicine  Next Visit: 08/08/2020  Okay to refill per Dr. Corliss Skains.

## 2020-03-27 ENCOUNTER — Encounter: Payer: Self-pay | Admitting: Rheumatology

## 2020-03-27 NOTE — Telephone Encounter (Signed)
She can try salonpas patches or biofreeze OTC.  It is unlikely that her insurance will cover lidoderm patches.  If her lower back pain persists she can schedule an office visit appointment for x-rays and further evaluation.

## 2020-03-28 NOTE — Progress Notes (Deleted)
Office Visit Note  Patient: Emily Page             Date of Birth: Mar 22, 1951           MRN: [id number]             PCP: Juluis Rainier, MD Referring: Juluis Rainier, MD Visit Date: 04/03/2020 Occupation: @GUAROCC @  Subjective:  No chief complaint on file.   History of Present Illness: Emily Page is a 69 y.o. female ***   Activities of Daily Living:  Patient reports morning stiffness for *** {minute/hour:19697}.   Patient {ACTIONS;DENIES/REPORTS:21021675::"Denies"} nocturnal pain.  Difficulty dressing/grooming: {ACTIONS;DENIES/REPORTS:21021675::"Denies"} Difficulty climbing stairs: {ACTIONS;DENIES/REPORTS:21021675::"Denies"} Difficulty getting out of chair: {ACTIONS;DENIES/REPORTS:21021675::"Denies"} Difficulty using hands for taps, buttons, cutlery, and/or writing: {ACTIONS;DENIES/REPORTS:21021675::"Denies"}  No Rheumatology ROS completed.   PMFS History:  Patient Active Problem List   Diagnosis Date Noted  . Dysautonomia (HCC) 12/08/2019  . Cardiomyopathy (HCC) 12/08/2019  . Tachycardia 12/08/2019  . Fibromyalgia 03/06/2017  . Sicca (HCC) 03/06/2017  . History of Raynaud's syndrome 03/06/2017  . History of total hip replacement, right 03/06/2017  . Vitamin D deficiency 03/06/2017  . History of migraine 03/06/2017  . Vasovagal syncope 03/06/2017  . 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 OSA (obstructive sleep apnea) 10/28/2016  . Excessive daytime sleepiness 04/22/2016  . Exertional shortness of breath 07/14/2014  . Palpitations 07/14/2014  . Abnormal nuclear stress test 07/14/2014  . Hay fever 04/14/2014  . Allergic rhinitis 04/14/2014  . Cervical pain 04/14/2014  . Fatigue 04/14/2014  . Acid reflux 04/14/2014  . Current drug use 04/14/2014  . Atypical migraine 04/14/2014  . Arthralgia of hand 04/14/2014  . Arthralgia of multiple joints 04/14/2014  . Paroxysmal digital cyanosis 04/14/2014  . Hypertension   . Chest  pain, atypical 03/31/2014  . PVC (premature ventricular contraction) 03/31/2014  . Blood glucose elevated 02/02/2014    Past Medical History:  Diagnosis Date  . Allergic rhinitis    followed by Dr 04/04/2014 in the past  . Fibromyalgia    followed by Dr Willa Rough  . Foot pain, right    w intermittent swelling felt to be related to how she is walking on her foot. evaluated by Dr Bedelia Person (podiatrist)  . GERD (gastroesophageal reflux disease)    Followed by Dr Leticia Penna in the past  . 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 History of EKG    w palpitations and nonspecific T-wave changes, stress cardiolite 6/08 showed normal LV size in a systolic function,no maximum effort stress test with moderate work load and good HR achieved,no EKG criteria of ischemia. Did have occassional PVC's  . Hypertension   . Migraines    [facility].   . OSA (obstructive sleep apnea) 10/28/2016   Very mild  . PVC (premature ventricular contraction)   . Raynaud's disease    followed by Dr 14/02/2016 and norvasc has helped  . Rosacea    and seborrheic dermatitis followed by Dr Corliss Skains    Family History  Problem Relation Age of Onset  . Hypertension Mother   . Heart attack Father   . Hypertension Father   . Hyperlipidemia Father    Past Surgical History:  Procedure Laterality Date  . CARDIAC CATHETERIZATION    . EYE SURGERY Left 10/2017   cataract extraction   . HIP SURGERY    . LEFT HEART CATHETERIZATION WITH CORONARY ANGIOGRAM N/A 07/28/2014   Procedure: LEFT HEART CATHETERIZATION WITH  CORONARY ANGIOGRAM;  Surgeon: Leonie Man, MD;  Location: [facility] CATH LAB;  Service: Cardiovascular;  Laterality: N/A;  . LEG SURGERY    . SPLIT NIGHT STUDY  04/10/2016  . TOTAL HIP ARTHROPLASTY Right   . VAGINAL HYSTERECTOMY     Social History   Social History Narrative  . Not on file   Immunization History  Administered Date(s) Administered  .  Influenza,inj,Quad PF,6+ Mos 08/24/2015  . PFIZER SARS-COV-2 Vaccination 01/03/2020, 01/28/2020     Objective: Vital Signs: There were no vitals taken for this visit.   Physical Exam   Musculoskeletal Exam: ***  CDAI Exam: CDAI Score: - Patient Global: -; Provider Global: - Swollen: -; Tender: - Joint Exam 04/03/2020   No joint exam has been documented for this visit   There is currently no information documented on the homunculus. Go to the Rheumatology activity and complete the homunculus joint exam.  Investigation: No additional findings.  Imaging: No results found.  Recent Labs: Lab Results  Component Value Date   WBC 7.6 07/21/2014   HGB 13.9 07/21/2014   PLT 250.0 07/21/2014   NA 139 04/24/2017   K 4.2 04/24/2017   CL 99 04/24/2017   CO2 23 04/24/2017   GLUCOSE 108 (H) 04/24/2017   BUN 8 04/24/2017   CREATININE 1.02 (H) 10/19/2018   CALCIUM 9.5 04/24/2017   GFRAA >60 10/19/2018    Speciality Comments: No specialty comments available.  Procedures:  No procedures performed Allergies: Amoxil [amoxicillin], Penicillins, Tape, Sulfa antibiotics, Zanaflex [tizanidine], and Polytrim [polymyxin b-trimethoprim]   Assessment / Plan:     Visit Diagnoses: No diagnosis found.  Orders: No orders of the defined types were placed in this encounter.  No orders of the defined types were placed in this encounter.   Face-to-face time spent with patient was *** minutes. Greater than 50% of time was spent in counseling and coordination of care.  Follow-Up Instructions: No follow-ups on file.   Ofilia Neas, PA-C  Note - This record has been created using Dragon software.  Chart creation errors have been sought, but may not always  have been located. Such creation errors do not reflect on  the standard of medical care.

## 2020-03-30 ENCOUNTER — Telehealth: Payer: Medicare Other | Admitting: Internal Medicine

## 2020-03-31 NOTE — Progress Notes (Addendum)
Office Visit Note  Patient: Emily Page             Date of Birth: Apr 02, 1951           MRN: [id number]             PCP: Leighton Ruff, MD Referring: Leighton Ruff, MD Visit Date: 04/06/2020 Occupation: @GUAROCC @  Subjective:  Left knee joint pain   History of Present Illness: Emily Page is a 69 y.o. female with history of fibromyalgia, raynaud's, and osteoarthritis.  She has been experiencing increased left sided lower back pain, left hip joint pain, and left knee joint, which has progressively been getting worse over the past several weeks.  She is having difficulty bearing weight on her left LE due to the severity of the pain.  She states her left knee buckles occasionally.  She has been experiencing nocturnal pain and continues to take flexeril 5 mg 1/2 tablet at bedtime. She has tried using voltaren gel which was not helpful and caused a rash.  She also has been using a heating pad.  She currently rates her pain a 7/10.  She denies any numbness or tingling at this time.  She denies any joint swelling or warmth in her knee joint. She continues to have frequent fibromyalgia flares. She has chronic fatigue secondary to insomnia.  She has had difficulty sleeping due to nocturnal pain.  She continues to have sicca symptoms and intermittent symptoms of Raynaud's.  She denies any other new or worsening pain.      Activities of Daily Living:  Patient reports joint stiffness all day  Patient reports nocturnal pain.  Difficulty dressing/grooming: Denies Difficulty climbing stairs: Reports Difficulty getting out of chair: Reports Difficulty using hands for taps, buttons, cutlery, and/or writing: Denies  Review of Systems  Constitutional: Positive for fatigue.  HENT: Positive for mouth dryness. Negative for mouth sores and nose dryness.   Eyes: Positive for dryness. Negative for pain and visual disturbance.  Respiratory: Positive for shortness of breath. Negative for  cough, hemoptysis and difficulty breathing.   Cardiovascular: Positive for swelling in legs/feet. Negative for chest pain, palpitations and hypertension.  Gastrointestinal: Negative for blood in stool, constipation and diarrhea.  Endocrine: Negative for increased urination.  Genitourinary: Negative for difficulty urinating and painful urination.  Musculoskeletal: Positive for arthralgias, joint pain, joint swelling, muscle weakness, morning stiffness and muscle tenderness. Negative for myalgias and myalgias.  Skin: Negative for color change, pallor, rash, hair loss, nodules/bumps, skin tightness, ulcers and sensitivity to sunlight.  Neurological: Negative for dizziness, numbness and headaches.  Hematological: Negative for bruising/bleeding tendency and swollen glands.  Psychiatric/Behavioral: Positive for sleep disturbance. Negative for depressed mood. The patient is not nervous/anxious.     PMFS History:  Patient Active Problem List   Diagnosis Date Noted  . Dysautonomia ([place]) 12/08/2019  . Cardiomyopathy ([place]) 12/08/2019  . Tachycardia 12/08/2019  . Fibromyalgia 03/06/2017  . Sicca ([place]) 03/06/2017  . History of Raynaud's syndrome 03/06/2017  . History of total hip replacement, right 03/06/2017  . Vitamin D deficiency 03/06/2017  . History of migraine 03/06/2017  . Vasovagal syncope 03/06/2017  . 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 OSA (obstructive sleep apnea) 10/28/2016  . Excessive daytime sleepiness 04/22/2016  . Exertional shortness of breath 07/14/2014  . Palpitations 07/14/2014  . Abnormal nuclear stress test 07/14/2014  . Hay fever 04/14/2014  . Allergic rhinitis 04/14/2014  . Cervical pain 04/14/2014  . Fatigue 04/14/2014  . Acid reflux  04/14/2014  . Current drug use 04/14/2014  . Atypical migraine 04/14/2014  . Arthralgia of hand 04/14/2014  . Arthralgia of multiple joints 04/14/2014  . Paroxysmal digital cyanosis 04/14/2014  . Hypertension    . Chest pain, atypical 03/31/2014  . PVC (premature ventricular contraction) 03/31/2014  . Blood glucose elevated 02/02/2014    Past Medical History:  Diagnosis Date  . Allergic rhinitis    followed by Dr Willa Rough in the past  . Fibromyalgia    followed by Dr Bedelia Person  . Foot pain, right    w intermittent swelling felt to be related to how she is walking on her foot. evaluated by Dr Leticia Penna (podiatrist)  . GERD (gastroesophageal reflux disease)    Followed by Dr Randa Evens in the past  . 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 History of EKG    w palpitations and nonspecific T-wave changes, stress cardiolite 6/08 showed normal LV size in a systolic function,no maximum effort stress test with moderate work load and good HR achieved,no EKG criteria of ischemia. Did have occassional PVC's  . Hypertension   . Migraines    [facility] past.   . OSA (obstructive sleep apnea) 10/28/2016   Very mild  . PVC (premature ventricular contraction)   . Raynaud's disease    followed by Dr Corliss Skains and norvasc has helped  . Rosacea    and seborrheic dermatitis followed by Dr Levy Sjogren    Family History  Problem Relation Age of Onset  . Hypertension Mother   . Heart attack Father   . Hypertension Father   . Hyperlipidemia Father    Past Surgical History:  Procedure Laterality Date  . CARDIAC CATHETERIZATION    . EYE SURGERY Left 10/2017   cataract extraction   . HIP ARTHROPLASTY    . HIP SURGERY    . LEFT HEART CATHETERIZATION WITH CORONARY ANGIOGRAM N/A 07/28/2014   Procedure: LEFT HEART CATHETERIZATION WITH CORONARY ANGIOGRAM;  Surgeon: Marykay Lex, MD;  Location: [facility] CATH LAB;  Service: Cardiovascular;  Laterality: N/A;  . LEG SURGERY    . SPLIT NIGHT STUDY  04/10/2016  . TOTAL HIP ARTHROPLASTY Right   . VAGINAL HYSTERECTOMY     Social History   Social History Narrative  . Not on file   Immunization History  Administered Date(s)  Administered  . Influenza,inj,Quad PF,6+ Mos 08/24/2015  . PFIZER SARS-COV-2 Vaccination 01/03/2020, 01/28/2020     Objective: Vital Signs: BP 130/62 (BP Location: Right Arm, Patient Position: Sitting, Cuff Size: Normal)   Pulse 61   Resp 16   Ht 5\' 2"  (1.575 m)   Wt 136 lb 9.6 oz (62 kg)   BMI 24.98 kg/m    Physical Exam Vitals and nursing note reviewed.  Constitutional:      Appearance: She is well-developed.  HENT:     Head: Normocephalic and atraumatic.  Eyes:     Conjunctiva/sclera: Conjunctivae normal.  Pulmonary:     Effort: Pulmonary effort is normal.  Abdominal:     General: Bowel sounds are normal.     Palpations: Abdomen is soft.  Musculoskeletal:     Cervical back: Normal range of motion.  Lymphadenopathy:     Cervical: No cervical adenopathy.  Skin:    General: Skin is warm and dry.     Capillary Refill: Capillary refill takes less than 2 seconds.  Neurological:     Mental Status: She is alert and oriented to  person, place, and time.  Psychiatric:        Behavior: Behavior normal.      Musculoskeletal Exam: Generalized hyperalgesia and positive tender points.  C-spine good ROM.  Painful ROM of lumbar spine.  Midline spinal tenderness in the lumbar region.  Shoulder joints, elbow joints, wrist joints, MCPs, PIPs, and DIPs good ROM with no synovitis.  Complete fist formation bilaterally.  Right hip replacement has good ROM with no discomfort.  Painful and limited ROM of the left hip.  Tenderness over the left trochanteric bursa.  Left knee has limited extension but no warmth or effusion.  Ankle joints good ROM with no tenderness or inflammation.   CDAI Exam: CDAI Score: -- Patient Global: --; Provider Global: -- Swollen: --; Tender: -- Joint Exam 04/06/2020   No joint exam has been documented for this visit   There is currently no information documented on the homunculus. Go to the Rheumatology activity and complete the homunculus joint  exam.  Investigation: No additional findings.  Imaging: No results found.  Recent Labs: Lab Results  Component Value Date   WBC 7.6 07/21/2014   HGB 13.9 07/21/2014   PLT 250.0 07/21/2014   NA 139 04/24/2017   K 4.2 04/24/2017   CL 99 04/24/2017   CO2 23 04/24/2017   GLUCOSE 108 (H) 04/24/2017   BUN 8 04/24/2017   CREATININE 1.02 (H) 10/19/2018   CALCIUM 9.5 04/24/2017   GFRAA >60 10/19/2018    Speciality Comments: No specialty comments available.  Procedures:  No procedures performed Allergies: Amoxil [amoxicillin], Penicillins, Tape, Sulfa antibiotics, Zanaflex [tizanidine], and Polytrim [polymyxin b-trimethoprim]   Assessment / Plan:     Visit Diagnoses: Fibromyalgia: She has generalized hyperalgesia and positive tender points on exam.  She continues to have intermittent fibromyalgia flares.  She has been experiencing severe pain in her lower back and left lower extremity.  X-rays of the lumbar spine, left hip, and left knee were obtained today.  She continues to take Flexeril 5 mg half tablet by mouth at bedtime for muscle spasms.  She has also tried applying Voltaren gel but developed a rash and has discontinued use.  She has been using a heating pad for symptomatic relief.  She has been experiencing increased fatigue secondary to insomnia.  Her insomnia has been worsening due to nocturnal pain.  We discussed the importance of regular exercise and good sleep hygiene.  She will follow-up in the office in 6 months.  Raynaud's disease without gangrene: She experiences intermittent symptoms of Raynaud's but her symptoms have been tolerable.  Sicca syndrome (HCC): She has chronic sicca symptoms.  Chronic pain of left knee -She has been experiencing increased left knee joint pain.  She has limited extension of the left knee on exam.  No warmth or effusion was noted.  X-rays of the left knee were obtained.  She was given a handout of knee joint exercises to perform. She declined a  cortisone injection today.  She will notify us if she would like to come back for a cortisone injection.  We also discussed visco gel injections.  She was encouraged to ice, elevate, and use a knee brace.  Plan: XR KNEE 3 VIEW LEFT  Pain in left hip - She has been experiencing increased discomfort in her lower back and left lower extremity.  She has had difficulty bearing weight due to the discomfort.  She has not had any recent injuries or falls.  Some of her discomfort has been radiating  into the left groin.  She has slightly limited range of motion of the left hip with discomfort.  She has no tenderness to palpation over the left trochanteric bursa.  She has no SI joint tenderness on exam.  X-rays of the left hip were obtained today.  She has moderate OA of the left hip joint. Plan: XR HIP UNILAT W OR W/O PELVIS 2-3 VIEWS LEFT  History of total hip replacement, right: Doing well.  She has good ROM with no discomfort.   Chronic midline low back pain with left-sided sciatica - She presents today with severe lower back pain.  She has midline spinal tenderness in the lumbar region.  She has no SI joint tenderness.  Her discomfort is currently a 7 out of 10.  She is been having difficulty bearing weight to the discomfort she has been experiencing her left lower extremity.  She is been taking Flexeril 5 mg half tablet by mouth at bedtime as tried using Voltaren gel topically with no relief.  She has also tried using a heating pad.  X-rays of the lumbar spine were obtained today.  X-rays of the lumbar spine were obtained today. Spondylosis at multiple levels noted. She was given a handout of back and core strengthening exercises. Plan: XR Lumbar Spine 2-3 Views  History of vitamin D deficiency: She is taking a vitamin D supplement.  Other medical conditions are listed as follows:  History of migraine  History of hypertension  Anterior basement membrane dystrophy  Holmes-Adie syndrome, unspecified  laterality  Orders: Orders Placed This Encounter  Procedures  . XR HIP UNILAT W OR W/O PELVIS 2-3 VIEWS LEFT  . XR Lumbar Spine 2-3 Views  . XR KNEE 3 VIEW LEFT   No orders of the defined types were placed in this encounter.   Face-to-face time spent with patient was 30 minutes. Greater than 50% of time was spent in counseling and coordination of care.  Follow-Up Instructions: Return in about 6 months (around 10/07/2020) for Fibromyalgia.   Gearldine Bienenstock, PA-C  Note - This record has been created using Dragon software.  Chart creation errors have been sought, but may not always  have been located. Such creation errors do not reflect on  the standard of medical care.

## 2020-04-03 ENCOUNTER — Ambulatory Visit: Payer: Medicare Other | Admitting: Physician Assistant

## 2020-04-06 ENCOUNTER — Ambulatory Visit (INDEPENDENT_AMBULATORY_CARE_PROVIDER_SITE_OTHER): Payer: Medicare Other | Admitting: Physician Assistant

## 2020-04-06 ENCOUNTER — Ambulatory Visit: Payer: Self-pay

## 2020-04-06 ENCOUNTER — Encounter: Payer: Self-pay | Admitting: Physician Assistant

## 2020-04-06 ENCOUNTER — Other Ambulatory Visit: Payer: Self-pay

## 2020-04-06 VITALS — BP 130/62 | HR 61 | Resp 16 | Ht 62.0 in | Wt 136.6 lb

## 2020-04-06 DIAGNOSIS — Z8669 Personal history of other diseases of the nervous system and sense organs: Secondary | ICD-10-CM

## 2020-04-06 DIAGNOSIS — Z8679 Personal history of other diseases of the circulatory system: Secondary | ICD-10-CM

## 2020-04-06 DIAGNOSIS — M25552 Pain in left hip: Secondary | ICD-10-CM

## 2020-04-06 DIAGNOSIS — M5442 Lumbago with sciatica, left side: Secondary | ICD-10-CM | POA: Diagnosis not present

## 2020-04-06 DIAGNOSIS — M25562 Pain in left knee: Secondary | ICD-10-CM | POA: Diagnosis not present

## 2020-04-06 DIAGNOSIS — M797 Fibromyalgia: Secondary | ICD-10-CM

## 2020-04-06 DIAGNOSIS — I7300 Raynaud's syndrome without gangrene: Secondary | ICD-10-CM

## 2020-04-06 DIAGNOSIS — M3500 Sicca syndrome, unspecified: Secondary | ICD-10-CM

## 2020-04-06 DIAGNOSIS — H18529 Epithelial (juvenile) corneal dystrophy, unspecified eye: Secondary | ICD-10-CM

## 2020-04-06 DIAGNOSIS — Z8639 Personal history of other endocrine, nutritional and metabolic disease: Secondary | ICD-10-CM | POA: Diagnosis not present

## 2020-04-06 DIAGNOSIS — Z96641 Presence of right artificial hip joint: Secondary | ICD-10-CM | POA: Diagnosis not present

## 2020-04-06 DIAGNOSIS — H57059 Tonic pupil, unspecified eye: Secondary | ICD-10-CM | POA: Diagnosis not present

## 2020-04-06 DIAGNOSIS — G8929 Other chronic pain: Secondary | ICD-10-CM | POA: Diagnosis not present

## 2020-04-06 NOTE — Patient Instructions (Addendum)
Journal for Nurse Practitioners, 15(4), 263-267. Retrieved August 31, 2018 from [email or web address]">  Knee Exercises Ask your health care provider which exercises are safe for you. Do exercises exactly as told by your health care provider and adjust them as directed. It is normal to feel mild stretching, pulling, tightness, or discomfort as you do these exercises. Stop right away if you feel sudden pain or your pain gets worse. Do not begin these exercises until told by your health care provider. Stretching and range-of-motion exercises These exercises warm up your muscles and joints and improve the movement and flexibility of your knee. These exercises also help to relieve pain and swelling. Knee extension, prone 1. Lie on your abdomen (prone position) on a bed. 2. Place your left / right knee just beyond the edge of the surface so your knee is not on the bed. You can put a towel under your left / right thigh just above your kneecap for comfort. 3. Relax your leg muscles and allow gravity to straighten your knee (extension). You should feel a stretch behind your left / right knee. 4. Hold this position for __________ seconds. 5. Scoot up so your knee is supported between repetitions. Repeat __________ times. Complete this exercise __________ times a day. Knee flexion, active  1. Lie on your back with both legs straight. If this causes back discomfort, bend your left / right knee so your foot is flat on the floor. 2. Slowly slide your left / right heel back toward your buttocks. Stop when you feel a gentle stretch in the front of your knee or thigh (flexion). 3. Hold this position for __________ seconds. 4. Slowly slide your left / right heel back to the starting position. Repeat __________ times. Complete this exercise __________ times a day. Quadriceps stretch, prone  1. Lie on your abdomen on a firm surface, such as a bed or padded floor. 2. Bend your left / right knee and hold  your ankle. If you cannot reach your ankle or pant leg, loop a belt around your foot and grab the belt instead. 3. Gently pull your heel toward your buttocks. Your knee should not slide out to the side. You should feel a stretch in the front of your thigh and knee (quadriceps). 4. Hold this position for __________ seconds. Repeat __________ times. Complete this exercise __________ times a day. Hamstring, supine 1. Lie on your back (supine position). 2. Loop a belt or towel over the ball of your left / right foot. The ball of your foot is on the walking surface, right under your toes. 3. Straighten your left / right knee and slowly pull on the belt to raise your leg until you feel a gentle stretch behind your knee (hamstring). ? Do not let your knee bend while you do this. ? Keep your other leg flat on the floor. 4. Hold this position for __________ seconds. Repeat __________ times. Complete this exercise __________ times a day. Strengthening exercises These exercises build strength and endurance in your knee. Endurance is the ability to use your muscles for a long time, even after they get tired. Quadriceps, isometric This exercise stretches the muscles in front of your thigh (quadriceps) without moving your knee joint (isometric). 1. Lie on your back with your left / right leg extended and your other knee bent. Put a rolled towel or small pillow under your knee if told by your health care provider. 2. Slowly tense the muscles in the front of your left /   right thigh. You should see your kneecap slide up toward your hip or see increased dimpling just above the knee. This motion will push the back of the knee toward the floor. 3. For __________ seconds, hold the muscle as tight as you can without increasing your pain. 4. Relax the muscles slowly and completely. Repeat __________ times. Complete this exercise __________ times a day. Straight leg raises This exercise stretches the muscles in front  of your thigh (quadriceps) and the muscles that move your hips (hip flexors). 1. Lie on your back with your left / right leg extended and your other knee bent. 2. Tense the muscles in the front of your left / right thigh. You should see your kneecap slide up or see increased dimpling just above the knee. Your thigh may even shake a bit. 3. Keep these muscles tight as you raise your leg 4-6 inches (10-15 cm) off the floor. Do not let your knee bend. 4. Hold this position for __________ seconds. 5. Keep these muscles tense as you lower your leg. 6. Relax your muscles slowly and completely after each repetition. Repeat __________ times. Complete this exercise __________ times a day. Hamstring, isometric 1. Lie on your back on a firm surface. 2. Bend your left / right knee about __________ degrees. 3. Dig your left / right heel into the surface as if you are trying to pull it toward your buttocks. Tighten the muscles in the back of your thighs (hamstring) to "dig" as hard as you can without increasing any pain. 4. Hold this position for __________ seconds. 5. Release the tension gradually and allow your muscles to relax completely for __________ seconds after each repetition. Repeat __________ times. Complete this exercise __________ times a day. Hamstring curls If told by your health care provider, do this exercise while wearing ankle weights. Begin with __________ lb weights. Then increase the weight by 1 lb (0.5 kg) increments. Do not wear ankle weights that are more than __________ lb. 1. Lie on your abdomen with your legs straight. 2. Bend your left / right knee as far as you can without feeling pain. Keep your hips flat against the floor. 3. Hold this position for __________ seconds. 4. Slowly lower your leg to the starting position. Repeat __________ times. Complete this exercise __________ times a day. Squats This exercise strengthens the muscles in front of your thigh and knee  (quadriceps). 1. Stand in front of a table, with your feet and knees pointing straight ahead. You may rest your hands on the table for balance but not for support. 2. Slowly bend your knees and lower your hips like you are going to sit in a chair. ? Keep your weight over your heels, not over your toes. ? Keep your lower legs upright so they are parallel with the table legs. ? Do not let your hips go lower than your knees. ? Do not bend lower than told by your health care provider. ? If your knee pain increases, do not bend as low. 3. Hold the squat position for __________ seconds. 4. Slowly push with your legs to return to standing. Do not use your hands to pull yourself to standing. Repeat __________ times. Complete this exercise __________ times a day. Wall slides This exercise strengthens the muscles in front of your thigh and knee (quadriceps). 1. Lean your back against a smooth wall or door, and walk your feet out 18-24 inches (46-61 cm) from it. 2. Place your feet hip-width apart. 3.   Slowly slide down the wall or door until your knees bend __________ degrees. Keep your knees over your heels, not over your toes. Keep your knees in line with your hips. 4. Hold this position for __________ seconds. Repeat __________ times. Complete this exercise __________ times a day. Straight leg raises This exercise strengthens the muscles that rotate the leg at the hip and move it away from your body (hip abductors). 1. Lie on your side with your left / right leg in the top position. Lie so your head, shoulder, knee, and hip line up. You may bend your bottom knee to help you keep your balance. 2. Roll your hips slightly forward so your hips are stacked directly over each other and your left / right knee is facing forward. 3. Leading with your heel, lift your top leg 4-6 inches (10-15 cm). You should feel the muscles in your outer hip lifting. ? Do not let your foot drift forward. ? Do not let your knee  roll toward the ceiling. 4. Hold this position for __________ seconds. 5. Slowly return your leg to the starting position. 6. Let your muscles relax completely after each repetition. Repeat __________ times. Complete this exercise __________ times a day. Straight leg raises This exercise stretches the muscles that move your hips away from the front of the pelvis (hip extensors). 1. Lie on your abdomen on a firm surface. You can put a pillow under your hips if that is more comfortable. 2. Tense the muscles in your buttocks and lift your left / right leg about 4-6 inches (10-15 cm). Keep your knee straight as you lift your leg. 3. Hold this position for __________ seconds. 4. Slowly lower your leg to the starting position. 5. Let your leg relax completely after each repetition. Repeat __________ times. Complete this exercise __________ times a day. This information is not intended to replace advice given to you by your health care provider. Make sure you discuss any questions you have with your health care provider. Document Revised: 09/01/2018 Document Reviewed: 09/01/2018 [employer] Patient Education  2020 [employer]  Core Strength Exercises  Core exercises help to build strength in the muscles between your ribs and your hips (abdominal muscles). These muscles help to support your body and keep your spine stable. It is important to maintain strength in your core to prevent injury and pain. Some activities, such as yoga and Pilates, can help to strengthen core muscles. You can also strengthen core muscles with exercises at home. It is important to talk to your health care provider before you start a new exercise routine. What are the benefits of core strength exercises? Core strength exercises can:  Reduce back pain.  Help to rebuild strength after a back or spine injury.  Help to prevent injury during physical activity, especially injuries to the back and knees. How to do core strength  exercises Repeat these exercises 10-15 times, or until you are tired. Do exercises exactly as told by your health care provider and adjust them as directed. It is normal to feel mild stretching, pulling, tightness, or discomfort as you do these exercises. If you feel any pain while doing these exercises, stop. If your pain continues or gets worse when doing core exercises, contact your health care provider. You may want to use a padded yoga or exercise mat for strength exercises that are done on the floor. Bridging  1. Lie on your back on a firm surface with your knees bent and your feet  flat on the floor. 2. Raise your hips so that your knees, hips, and shoulders form a straight line together. Keep your abdominal muscles tight. 3. Hold this position for 3-5 seconds. 4. Slowly lower your hips to the starting position. 5. Let your muscles relax completely between repetitions. Single-leg bridge 1. Lie on your back on a firm surface with your knees bent and your feet flat on the floor. 2. Raise your hips so that your knees, hips, and shoulders form a straight line together. Keep your abdominal muscles tight. 3. Lift one foot off the floor, then completely straighten that leg. 4. Hold this position for 3-5 seconds. 5. Put the straight leg back down in the bent position. 6. Slowly lower your hips to the starting position. 7. Repeat these steps using your other leg. Side bridge 1. Lie on your side with your knees bent. Prop yourself up on the elbow that is near the floor. 2. Using your abdominal muscles and your elbow that is on the floor, raise your body off the floor. Raise your hip so that your shoulder, hip, and foot form a straight line together. 3. Hold this position for 10 seconds. Keep your head and neck raised and away from your shoulder (in their normal, neutral position). Keep your abdominal muscles tight. 4. Slowly lower your hip to the starting position. 5. Repeat and try to hold this  position longer, working your way up to 30 seconds. Abdominal crunch 1. Lie on your back on a firm surface. Bend your knees and keep your feet flat on the floor. 2. Cross your arms over your chest. 3. Without bending your neck, tip your chin slightly toward your chest. 4. Tighten your abdominal muscles as you lift your chest just high enough to lift your shoulder blades off of the floor. Do not hold your breath. You can do this with short lifts or long lifts. 5. Slowly return to the starting position. Bird dog 1. Get on your hands and knees, with your legs shoulder-width apart and your arms under your shoulders. Keep your back straight. 2. Tighten your abdominal muscles. 3. Raise one of your legs off the floor and straighten it. Try to keep it parallel to the floor. 4. Slowly lower your leg to the starting position. 5. Raise one of your arms off the floor and straighten it. Try to keep it parallel to the floor. 6. Slowly lower your arm to the starting position. 7. Repeat with the other arm and leg. If possible, try raising a leg and arm at the same time, on opposite sides of the body. For example, raise your left hand and your right leg. Plank 1. Lie on your belly. 2. Prop up your body onto your forearms and your feet, keeping your legs straight. Your body should make a straight line between your shoulders and feet. 3. Hold this position for 10 seconds while keeping your abdominal muscles tight. 4. Lower your body to the starting position. 5. Repeat and try to hold this position longer, working your way up to 30 seconds. Cross-core strengthening 1. Stand with your feet shoulder-width apart. 2. Hold a ball out in front of you. Keep your arms straight. 3. Tighten your abdominal muscles and slowly rotate at your waist from side to side. Keep your feet flat. 4. Once you are comfortable, try repeating this exercise with a heavier ball. Top core strengthening 1. Stand about 18 inches (46 cm) in  front of a wall, with your back  to the wall. 2. Keep your feet flat and shoulder-width apart. 3. Tighten your abdominal muscles. 4. Bend your hips and knees. 5. Slowly reach between your legs to touch the wall behind you. 6. Slowly stand back up. 7. Raise your arms over your head and reach behind you. 8. Return to the starting position. General tips  Do not do any exercises that cause pain. If you have pain while exercising, talk to your health care provider.  Always stretch before and after doing these exercises. This can help prevent injury.  Maintain a healthy weight. Ask your health care provider what weight is healthy for you. Contact a health care provider if:  You have back pain that gets worse or does not go away.  You feel pain while doing core strength exercises. Get help right away if:  You have severe pain that does not get better with medicine. Summary  Core exercises help to build strength in the muscles between your ribs and your waist.  Core muscles help to support your body and keep your spine stable.  Some activities, such as yoga and Pilates, can help to strengthen core muscles.  Core strength exercises can help back pain and can prevent injury.  If you feel any pain while doing core strength exercises, stop. This information is not intended to replace advice given to you by your health care provider. Make sure you discuss any questions you have with your health care provider. Document Revised: 03/03/2019 Document Reviewed: 04/02/2017 [employer] Patient Education  [place]. Back Exercises These exercises help to make your trunk and back strong. They also help to keep the lower back flexible. Doing these exercises can help to prevent back pain or lessen existing pain.  If you have back pain, try to do these exercises 2-3 times each day or as told by your doctor.  As you get better, do the exercises once each day. Repeat the exercises more often as  told by your doctor.  To stop back pain from coming back, do the exercises once each day, or as told by your doctor. Exercises Single knee to chest Do these steps 3-5 times in a row for each leg: 5. Lie on your back on a firm bed or the floor with your legs stretched out. 6. Bring one knee to your chest. 7. Grab your knee or thigh with both hands and hold them it in place. 8. Pull on your knee until you feel a gentle stretch in your lower back or buttocks. 9. Keep doing the stretch for 10-30 seconds. 10. Slowly let go of your leg and straighten it. Pelvic tilt Do these steps 5-10 times in a row: 5. Lie on your back on a firm bed or the floor with your legs stretched out. 6. Bend your knees so they point up to the ceiling. Your feet should be flat on the floor. 7. Tighten your lower belly (abdomen) muscles to press your lower back against the floor. This will make your tailbone point up to the ceiling instead of pointing down to your feet or the floor. 8. Stay in this position for 5-10 seconds while you gently tighten your muscles and breathe evenly. Cat-cow Do these steps until your lower back bends more easily: 5. Get on your hands and knees on a firm surface. Keep your hands under your shoulders, and keep your knees under your hips. You may put padding under your knees. 6. Let your head hang down toward your chest.  Tighten (contract) the muscles in your belly. Point your tailbone toward the floor so your lower back becomes rounded like the back of a cat. 7. Stay in this position for 5 seconds. 8. Slowly lift your head. Let the muscles of your belly relax. Point your tailbone up toward the ceiling so your back forms a sagging arch like the back of a cow. 9. Stay in this position for 5 seconds.  Press-ups Do these steps 5-10 times in a row: 5. Lie on your belly (face-down) on the floor. 6. Place your hands near your head, about shoulder-width apart. 7. While you keep your back relaxed  and keep your hips on the floor, slowly straighten your arms to raise the top half of your body and lift your shoulders. Do not use your back muscles. You may change where you place your hands in order to make yourself more comfortable. 8. Stay in this position for 5 seconds. 9. Slowly return to lying flat on the floor.  Bridges Do these steps 10 times in a row: 7. Lie on your back on a firm surface. 8. Bend your knees so they point up to the ceiling. Your feet should be flat on the floor. Your arms should be flat at your sides, next to your body. 9. Tighten your butt muscles and lift your butt off the floor until your waist is almost as high as your knees. If you do not feel the muscles working in your butt and the back of your thighs, slide your feet 1-2 inches farther away from your butt. 10. Stay in this position for 3-5 seconds. 11. Slowly lower your butt to the floor, and let your butt muscles relax. If this exercise is too easy, try doing it with your arms crossed over your chest. Belly crunches Do these steps 5-10 times in a row: 6. Lie on your back on a firm bed or the floor with your legs stretched out. 7. Bend your knees so they point up to the ceiling. Your feet should be flat on the floor. 8. Cross your arms over your chest. 9. Tip your chin a little bit toward your chest but do not bend your neck. 10. Tighten your belly muscles and slowly raise your chest just enough to lift your shoulder blades a tiny bit off of the floor. Avoid raising your body higher than that, because it can put too much stress on your low back. 11. Slowly lower your chest and your head to the floor. Back lifts Do these steps 5-10 times in a row: 5. Lie on your belly (face-down) with your arms at your sides, and rest your forehead on the floor. 6. Tighten the muscles in your legs and your butt. 7. Slowly lift your chest off of the floor while you keep your hips on the floor. Keep the back of your head in  line with the curve in your back. Look at the floor while you do this. 8. Stay in this position for 3-5 seconds. 9. Slowly lower your chest and your face to the floor. Contact a doctor if:  Your back pain gets a lot worse when you do an exercise.  Your back pain does not get better 2 hours after you exercise. If you have any of these problems, stop doing the exercises. Do not do them again unless your doctor says it is okay. Get help right away if:  You have sudden, very bad back pain. If this happens, stop doing the  exercises. Do not do them again unless your doctor says it is okay. This information is not intended to replace advice given to you by your health care provider. Make sure you discuss any questions you have with your health care provider. Document Revised: 08/06/2018 Document Reviewed: 08/06/2018 [employer] Patient Education  2020 [employer].

## 2020-04-13 ENCOUNTER — Telehealth: Payer: Medicare Other | Admitting: Internal Medicine

## 2020-04-27 ENCOUNTER — Telehealth (INDEPENDENT_AMBULATORY_CARE_PROVIDER_SITE_OTHER): Payer: Medicare Other | Admitting: Internal Medicine

## 2020-04-27 ENCOUNTER — Telehealth: Payer: Self-pay

## 2020-04-27 ENCOUNTER — Other Ambulatory Visit: Payer: Self-pay

## 2020-04-27 VITALS — BP 168/79 | HR 60 | Wt 130.0 lb

## 2020-04-27 DIAGNOSIS — I429 Cardiomyopathy, unspecified: Secondary | ICD-10-CM

## 2020-04-27 DIAGNOSIS — G901 Familial dysautonomia [Riley-Day]: Secondary | ICD-10-CM

## 2020-04-27 DIAGNOSIS — I493 Ventricular premature depolarization: Secondary | ICD-10-CM

## 2020-04-27 DIAGNOSIS — R002 Palpitations: Secondary | ICD-10-CM | POA: Diagnosis not present

## 2020-04-27 NOTE — Progress Notes (Signed)
Electrophysiology TeleHealth Note   Due to national recommendations of social distancing due to COVID 19, an audio/video telehealth visit is felt to be most appropriate for this patient at this time.  See MyChart message from today for the patient's consent to telehealth for [facility]   Date:  04/27/2020   ID:  Emily Page, DOB 07/23/1951, MRN [id number]  Location: patient's home  Provider location: [address] [place]  Evaluation Performed: Follow-up visit  PCP:  Juluis Rainier, MD  Cardiologist:     Electrophysiologist:  SK   Chief Complaint:   palpitations  History of Present Illness:    Emily Page is a 69 y.o. female who presents via audio/video conferencing for a telehealth visit today.  Since last being seen in our clinic forstereotypical  palpitations  and dysautonomia, orthostatic lightheadedness but without demonstrable hypotension,   the patient reports still having spells of weakness and LH--rhythm >> normal; BP recordings fail to demonstrate hypotension; no GI symptoms  Duration 30 min--6hrs; sometimes assoc with difficulty to catch breath  Increased frequency with exertion; but still 2 x week   Having leg pain>> imaging arthritis in back, hip and knee ?  May need hip/knee replacement  >. Alusio later this month   Also noted light across vision of   duration for 10-15 min; she describes these as "aura," a mild headache.    Sleep disordered breathing-- without daytime somnolence-   DATE TEST EF   8/15 Echo 45-50   9/15  Cath   Normal CA  11/16 cMRI  47% No LGE  6/18 Echo  50-55%       CPX 11/16 FVC 2.83 (100%)  FEV1 2.13 (94%)  FEV1/FVC 75 (96%)   The patient denies symptoms of fevers, chills, cough, or new SOB worrisome for COVID 19.   Past Medical History:  Diagnosis Date  . Allergic rhinitis    followed by Dr Willa Rough in the past  . Fibromyalgia    followed by Dr Bedelia Person  . Foot pain,  right    w intermittent swelling felt to be related to how she is walking on her foot. evaluated by Dr Leticia Penna (podiatrist)  . GERD (gastroesophageal reflux disease)    Followed by Dr Randa Evens in the past  . 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 History of EKG    w palpitations and nonspecific T-wave changes, stress cardiolite 6/08 showed normal LV size in a systolic function,no maximum effort stress test with moderate work load and good HR achieved,no EKG criteria of ischemia. Did have occassional PVC's  . Hypertension   . Migraines    [facility].   . OSA (obstructive sleep apnea) 10/28/2016   Very mild  . PVC (premature ventricular contraction)   . Raynaud's disease    followed by Dr Corliss Skains and norvasc has helped  . Rosacea    and seborrheic dermatitis followed by Dr Levy Sjogren    Past Surgical History:  Procedure Laterality Date  . CARDIAC CATHETERIZATION    . EYE SURGERY Left 10/2017   cataract extraction   . HIP ARTHROPLASTY    . HIP SURGERY    . LEFT HEART CATHETERIZATION WITH CORONARY ANGIOGRAM N/A 07/28/2014   Procedure: LEFT HEART CATHETERIZATION WITH CORONARY ANGIOGRAM;  Surgeon: Marykay Lex, MD;  Location: [facility] CATH LAB;  Service: Cardiovascular;  Laterality: N/A;  . LEG SURGERY    .  SPLIT NIGHT STUDY  04/10/2016  . TOTAL HIP ARTHROPLASTY Right   . VAGINAL HYSTERECTOMY      Current Outpatient Medications  Medication Sig Dispense Refill  . amLODipine (NORVASC) 2.5 MG tablet Take 1 tablet (2.5 mg total) by mouth daily. 90 tablet 3  . aspirin EC 81 MG tablet Take 81 mg by mouth every evening.    Marland Kitchen CALCIUM PO Take 600 mg by mouth daily.     . Cholecalciferol (VITAMIN D) 125 MCG (5000 UT) CAPS Take 1 capsule by mouth daily.    . clindamycin (CLEOCIN) 150 MG capsule Take 150 mg by mouth 4 (four) times daily.    . cyclobenzaprine (FLEXERIL) 5 MG tablet TAKE 1/2 TABLET (2.5 MG TOTAL) BY MOUTH AT BEDTIME. 30 tablet 0    . esomeprazole (NEXIUM) 40 MG capsule Take 40 mg by mouth daily at 12 noon.    Marland Kitchen estradiol (ESTRACE) 2 MG tablet Take 2 mg by mouth daily.    . fluconazole (DIFLUCAN) 150 MG tablet Take 150 mg by mouth daily as needed (for yeast infection).     . fluticasone (FLONASE) 50 MCG/ACT nasal spray     . hydrALAZINE (APRESOLINE) 25 MG tablet Take 1 tablet (25 mg total) by mouth 2 (two) times daily. 180 tablet 2  . INVELTYS 1 % SUSP INSTILL 1 DROP IN EYE(S) 3 TIMES A DAY FOR 2 WEEKS  1  . ketoconazole (NIZORAL) 2 % cream Apply 1 application topically daily.    Marland Kitchen ketoconazole (NIZORAL) 2 % shampoo Apply 1 application topically daily.     Marland Kitchen lisinopril (ZESTRIL) 40 MG tablet TAKE 1 TABLET (40 MG TOTAL) BY MOUTH DAILY. PLEASE CALL TO SCHEDULE APPOINTMENT FOR FURTHER REFILLS [phone] 90 tablet 3  . Magnesium Malate 1250 (141.7 Mg) MG TABS Take 2 tablets by mouth daily.    . metoprolol succinate (TOPROL-XL) 100 MG 24 hr tablet Take 1 tablet (100 mg total) by mouth daily. 90 tablet 2  . metroNIDAZOLE (METROCREAM) 0.75 % cream Apply 1 application topically 2 (two) times daily.    Marland Kitchen nystatin-triamcinolone ointment (MYCOLOG) Apply 1 application topically daily as needed (to rash).     . Probiotic Product (PROBIOTIC PO) Take by mouth daily.     No current facility-administered medications for this visit.    Allergies:   Amoxil [amoxicillin], Penicillins, Tape, Sulfa antibiotics, Zanaflex [tizanidine], Polytrim [polymyxin b-trimethoprim], and Voltaren [diclofenac sodium]   Social History:  The patient  reports that she has never smoked. She has never used smokeless tobacco. She reports current alcohol use of about 7.0 standard drinks of alcohol per week. She reports that she does not use drugs.   Family History:  The patient's   family history includes Heart attack in her father; Hyperlipidemia in her father; Hypertension in her father and mother.   ROS:  Please see the history of present illness.   All  other systems are personally reviewed and negative.    Exam:    Vital Signs:  BP (!) 168/79   Pulse 60   Wt 130 lb (59 kg)   BMI 23.78 kg/m        Labs/Other Tests and Data Reviewed:    Recent Labs: No results found for requested labs within last 8760 hours.   Wt Readings from Last 3 Encounters:  04/27/20 130 lb (59 kg)  04/06/20 136 lb 9.6 oz (62 kg)  12/23/19 130 lb (59 kg)     Other studies personally reviewed: LH with recorded  rhythms showing normal sinus during spells of LH and weakness    ASSESSMENT & PLAN:    Orthostatic intolerance  Dyspnea  Palpitations  Cardiomyopathy mild      The spells are without objective measurement, even acutely, w normal BP and HR   Maybe there is preceding arrhythmia with PVCs as noted previously and with increased awareness of same-- will use ZIO; if no arrhythmic trigger, may have to consider non cardiac cause ?neuro  Dyspnea Unlikely anginal equivalent   Cath for similar symptoms 2015 normAL  VISUAL symptoms are concerning to me ( in my ignorance) and have asked her to reach out to her optomitrist who can refer if necessary to ophthalmology  More than 50% of 45 min was spent in counseling related to the above       COVID 19 screen The patient denies symptoms of COVID 19 at this time.  The importance of social distancing was discussed today.  Follow-up:  42m    Current medicines are reviewed at length with the patient today.   The patient does   have concerns regarding her medicines.  The following changes were made today:  none  Labs/ tests ordered today include: As above ZIO;    No orders of the defined types were placed in this encounter.   Future tests ( post COVID )     Patient Risk:  after full review of this patients clinical status, I feel that they are at moderate risk at this time.  Today, I have spent  23  minutes with the patient with telehealth technology discussing the above.  Signed, Virl Axe, MD  04/27/2020 2:39 PM     [place] [address] [place] [address] [phone] (office) [phone] (fax)

## 2020-04-27 NOTE — Telephone Encounter (Signed)
°  Patient Consent for Virtual Visit         Emily Page has provided verbal consent on 04/27/2020 for a virtual visit (video or telephone).   CONSENT FOR VIRTUAL VISIT FOR:  Emily Page  By participating in this virtual visit I agree to the following:  I hereby voluntarily request, consent and authorize [facility] and its employed or contracted physicians, physician assistants, nurse practitioners or other licensed health care professionals (the Practitioner), to provide me with telemedicine health care services (the Services") as deemed necessary by the treating Practitioner. I acknowledge and consent to receive the Services by the Practitioner via telemedicine. I understand that the telemedicine visit will involve communicating with the Practitioner through live audiovisual communication technology and the disclosure of certain medical information by electronic transmission. I acknowledge that I have been given the opportunity to request an in-person assessment or other available alternative prior to the telemedicine visit and am voluntarily participating in the telemedicine visit.  I understand that I have the right to withhold or withdraw my consent to the use of telemedicine in the course of my care at any time, without affecting my right to future care or treatment, and that the Practitioner or I may terminate the telemedicine visit at any time. I understand that I have the right to inspect all information obtained and/or recorded in the course of the telemedicine visit and may receive copies of available information for a reasonable fee.  I understand that some of the potential risks of receiving the Services via telemedicine include:   Delay or interruption in medical evaluation due to technological equipment failure or disruption;  Information transmitted may not be sufficient (e.g. poor resolution of images) to allow for appropriate medical decision making by the  Practitioner; and/or   In rare instances, security protocols could fail, causing a breach of personal health information.  Furthermore, I acknowledge that it is my responsibility to provide information about my medical history, conditions and care that is complete and accurate to the best of my ability. I acknowledge that Practitioner's advice, recommendations, and/or decision may be based on factors not within their control, such as incomplete or inaccurate data provided by me or distortions of diagnostic images or specimens that may result from electronic transmissions. I understand that the practice of medicine is not an exact science and that Practitioner makes no warranties or guarantees regarding treatment outcomes. I acknowledge that a copy of this consent can be made available to me via my patient portal [facility] MyChart), or I can request a printed copy by calling the office of [facility].    I understand that my insurance will be billed for this visit.   I have read or had this consent read to me.  I understand the contents of this consent, which adequately explains the benefits and risks of the Services being provided via telemedicine.   I have been provided ample opportunity to ask questions regarding this consent and the Services and have had my questions answered to my satisfaction.  I give my informed consent for the services to be provided through the use of telemedicine in my medical care

## 2020-05-03 ENCOUNTER — Telehealth: Payer: Self-pay | Admitting: Radiology

## 2020-05-03 NOTE — Addendum Note (Signed)
Addended by: Alois Cliche on: 05/03/2020 10:17 AM   Modules accepted: Orders

## 2020-05-03 NOTE — Patient Instructions (Addendum)
Medication Instructions:  Your physician recommends that you continue on your current medications as directed. Please refer to the Current Medication list given to you today.  *If you need a refill on your cardiac medications before your next appointment, please call your pharmacy*   Lab Work: None ordered.  If you have labs (blood work) drawn today and your tests are completely normal, you will receive your results only by: Marland Kitchen MyChart Message (if you have MyChart) OR . A paper copy in the mail If you have any lab test that is abnormal or we need to change your treatment, we will call you to review the results.   Testing/Procedures: [facility]- Long Term Monitor Instructions   Your physician has requested you wear your ZIO patch monitor__14_____days.   This is a single patch monitor.  [employer] supplies one patch monitor per enrollment.  Additional stickers are not available.   Please do not apply patch if you will be having a Nuclear Stress Test, Echocardiogram, Cardiac CT, MRI, or Chest Xray during the time frame you would be wearing the monitor. The patch cannot be worn during these tests.  You cannot remove and re-apply the ZIO XT patch monitor.   Your ZIO patch monitor will be sent USPS Priority mail from [facility] directly to your home address. The monitor may also be mailed to a PO BOX if home delivery is not available.   It may take 3-5 days to receive your monitor after you have been enrolled.   Once you have received you monitor, please review enclosed instructions.  Your monitor has already been registered assigning a specific monitor serial # to you.   Applying the monitor   Shave hair from upper left chest.   Hold abrader disc by orange tab.  Rub abrader in 40 strokes over left upper chest as indicated in your monitor instructions.   Clean area with 4 enclosed alcohol pads .  Use all pads to assure are is cleaned thoroughly.  Let dry.   Apply patch as indicated in  monitor instructions.  Patch will be place under collarbone on left side of chest with arrow pointing upward.   Rub patch adhesive wings for 2 minutes.Remove white label marked "1".  Remove white label marked "2".  Rub patch adhesive wings for 2 additional minutes.   While looking in a mirror, press and release button in center of patch.  A small green light will flash 3-4 times .  This will be your only indicator the monitor has been turned on.     Do not shower for the first 24 hours.  You may shower after the first 24 hours.   Press button if you feel a symptom. You will hear a small click.  Record Date, Time and Symptom in the Patient Log Book.   When you are ready to remove patch, follow instructions on last 2 pages of Patient Log Book.  Stick patch monitor onto last page of Patient Log Book.   Place Patient Log Book in [place] box.  Use locking tab on box and tape box closed securely.  The [employer] has [employer] on it.  Please place in mailbox as soon as possible.  Your physician should have your test results approximately 7 days after the monitor has been mailed back to [facility].   Call Ruth at [phone] if you have questions regarding your ZIO XT patch monitor.  Call them immediately if you see  an orange light blinking on your monitor.   If your monitor falls off in less than 4 days contact our Monitor department at [phone].  If your monitor becomes loose or falls off after 4 days call [employer] at [phone] for suggestions on securing your monitor.     Follow-Up: At [facility], you and your health needs are our priority.  As part of our continuing mission to provide you with exceptional heart care, we have created designated Provider Care Teams.  These Care Teams include your primary Cardiologist (physician) and Advanced Practice Providers (APPs -  Physician Assistants and Nurse Practitioners) who all work together to provide  you with the care you need, when you need it.  We recommend signing up for the patient portal called "MyChart".  Sign up information is provided on this After Visit Summary.  MyChart is used to connect with patients for Virtual Visits (Telemedicine).  Patients are able to view lab/test results, encounter notes, upcoming appointments, etc.  Non-urgent messages can be sent to your provider as well.   To learn more about what you can do with MyChart, go to [email or web address].    Your next appointment:   6 month(s)  The format for your next appointment:   In Person  Provider:   Sherryl Manges, MD

## 2020-05-03 NOTE — Telephone Encounter (Signed)
Enrolled patient for a 14 day Zio monitor to be mailed to patients home.  

## 2020-05-08 ENCOUNTER — Other Ambulatory Visit: Payer: Self-pay | Admitting: Rheumatology

## 2020-05-09 NOTE — Telephone Encounter (Signed)
Last Visit: 04/06/2020 Next Visit: 08/08/2020  Last Fill: 03/14/2020   Okay to refill Flexeril?

## 2020-05-09 NOTE — Telephone Encounter (Signed)
Ok to refill flexeril

## 2020-05-11 ENCOUNTER — Telehealth: Payer: Self-pay | Admitting: Internal Medicine

## 2020-05-11 NOTE — Telephone Encounter (Signed)
New Message   Reggie from [facility] is calling and says the pt called them and told them she is allergic to medical adhesive. Reggie advised her to return the monitor and he would contact our office to inform us    Please advise

## 2020-05-19 DIAGNOSIS — Z471 Aftercare following joint replacement surgery: Secondary | ICD-10-CM | POA: Diagnosis not present

## 2020-05-19 DIAGNOSIS — Z96641 Presence of right artificial hip joint: Secondary | ICD-10-CM | POA: Diagnosis not present

## 2020-05-19 DIAGNOSIS — M25552 Pain in left hip: Secondary | ICD-10-CM | POA: Diagnosis not present

## 2020-05-19 DIAGNOSIS — M1612 Unilateral primary osteoarthritis, left hip: Secondary | ICD-10-CM | POA: Diagnosis not present

## 2020-05-22 ENCOUNTER — Telehealth: Payer: Self-pay | Admitting: *Deleted

## 2020-05-22 NOTE — Telephone Encounter (Signed)
° °  [facility] Pre-operative Risk Assessment    HEARTCARE STAFF: - Please ensure there is not already an duplicate clearance open for this procedure. - Under Visit Info/Reason for Call, type in Other and utilize the format Clearance MM/DD/YY or Clearance TBD. Do not use dashes or single digits. - If request is for dental extraction, please clarify the # of teeth to be extracted.  Request for surgical clearance:  1. What type of surgery is being performed? LEFT TOTAL HIP ARTHROPLASTY   2. When is this surgery scheduled? 06/20/20   3. What type of clearance is required (medical clearance vs. Pharmacy clearance to hold med vs. Both)? MEDICAL  4. Are there any medications that need to be held prior to surgery and how long? ASA    5. Practice name and name of physician performing surgery? [facility]; DR. FRANK ALUISIO   6. What is the office phone number? [phone]   7.   What is the office fax number?  [phone] ATTN: [place] [address].   Anesthesia type (None, local, MAC, general) ? CHOICE   Emily Page 05/22/2020, 3:01 PM  _________________________________________________________________   (provider comments below)

## 2020-05-22 NOTE — Telephone Encounter (Addendum)
   Primary Cardiologist: Sherryl Manges, MD  Chart reviewed as part of pre-operative protocol coverage. Patient was contacted 05/22/2020 in reference to pre-operative risk assessment for pending surgery as outlined below.  Emily Page was just seen recently on 04/27/20 via telemedicine visit by Dr. Graciela Husbands with complaints of dyspnea, orthostatic intolerance, palpitations. They had discussed needing hip procedure at that visit. Event monitor appears to be in progress, partially hindered by allergy adhesive. Regarding aspirin, do not see history of CAD (normal cath 2015), PCI, MI or CABG therefore should not be issue to hold from cardiac standpoint.  Will route to Dr. Graciela Husbands for his input on clearing patient and/or whether he needs to await monitor result first. She has not had in-person OV or EKG since 2019.   Dr. Graciela Husbands - Please route response to P CV DIV PREOP (the pre-op pool). Thank you.  Laurann Montana, PA-C 05/22/2020, 4:06 PM

## 2020-06-01 NOTE — Telephone Encounter (Signed)
Follow up   Pt is calling back to follow up her pre-op clearance

## 2020-06-06 NOTE — Telephone Encounter (Signed)
Called and spoke with patient.  She has used the [employer] monitor for her palpitations.  It has been interpreted as "no abnormality "I think at this point it is reasonable for her to proceed with her surgery with acceptable cardiovascular risk we will be available as needed.

## 2020-06-07 NOTE — Telephone Encounter (Signed)
   Primary Cardiologist: Sherryl Manges, MD  Chart reviewed as part of pre-operative protocol coverage. Given past medical history and time since last visit, based on ACC/AHA guidelines, Emily Page would be at acceptable risk for the planned procedure without further cardiovascular testing.   OK to hold aspirin if needed.  I will route this recommendation to the requesting party via Epic fax function and remove from pre-op pool.  Please call with questions.  Corine Shelter, PA-C 06/07/2020, 8:27 AM

## 2020-06-08 DIAGNOSIS — I493 Ventricular premature depolarization: Secondary | ICD-10-CM | POA: Diagnosis not present

## 2020-06-08 DIAGNOSIS — I10 Essential (primary) hypertension: Secondary | ICD-10-CM | POA: Diagnosis not present

## 2020-06-08 DIAGNOSIS — E559 Vitamin D deficiency, unspecified: Secondary | ICD-10-CM | POA: Diagnosis not present

## 2020-06-08 DIAGNOSIS — Z136 Encounter for screening for cardiovascular disorders: Secondary | ICD-10-CM | POA: Diagnosis not present

## 2020-06-08 DIAGNOSIS — E039 Hypothyroidism, unspecified: Secondary | ICD-10-CM | POA: Diagnosis not present

## 2020-06-08 DIAGNOSIS — K219 Gastro-esophageal reflux disease without esophagitis: Secondary | ICD-10-CM | POA: Diagnosis not present

## 2020-06-08 DIAGNOSIS — M1612 Unilateral primary osteoarthritis, left hip: Secondary | ICD-10-CM | POA: Diagnosis not present

## 2020-06-08 DIAGNOSIS — Z01818 Encounter for other preprocedural examination: Secondary | ICD-10-CM | POA: Diagnosis not present

## 2020-06-08 DIAGNOSIS — I429 Cardiomyopathy, unspecified: Secondary | ICD-10-CM | POA: Diagnosis not present

## 2020-06-08 DIAGNOSIS — Z131 Encounter for screening for diabetes mellitus: Secondary | ICD-10-CM | POA: Diagnosis not present

## 2020-06-08 DIAGNOSIS — I119 Hypertensive heart disease without heart failure: Secondary | ICD-10-CM | POA: Diagnosis not present

## 2020-06-08 DIAGNOSIS — Z79899 Other long term (current) drug therapy: Secondary | ICD-10-CM | POA: Diagnosis not present

## 2020-06-14 ENCOUNTER — Telehealth: Payer: Self-pay | Admitting: *Deleted

## 2020-06-14 NOTE — Telephone Encounter (Signed)
Received lab results from PCP. Results drawn on 06/08/2020 Reviewed by Sherron Ales, PA-C  Glucose 106 GFR 48 Triglycerides 238 HDLD 95 Vitamin D 100.1  Patient is on Flexeril.

## 2020-06-19 DIAGNOSIS — E559 Vitamin D deficiency, unspecified: Secondary | ICD-10-CM | POA: Diagnosis not present

## 2020-06-19 DIAGNOSIS — E878 Other disorders of electrolyte and fluid balance, not elsewhere classified: Secondary | ICD-10-CM | POA: Diagnosis not present

## 2020-06-20 DIAGNOSIS — M1612 Unilateral primary osteoarthritis, left hip: Secondary | ICD-10-CM | POA: Diagnosis not present

## 2020-07-25 DIAGNOSIS — Z471 Aftercare following joint replacement surgery: Secondary | ICD-10-CM | POA: Diagnosis not present

## 2020-07-25 DIAGNOSIS — Z96642 Presence of left artificial hip joint: Secondary | ICD-10-CM | POA: Diagnosis not present

## 2020-07-25 NOTE — Progress Notes (Deleted)
Office Visit Note  Patient: Emily Page             Date of Birth: 14-Dec-1950           MRN: [id number]             PCP: Juluis Rainier, MD Referring: Juluis Rainier, MD Visit Date: 08/08/2020 Occupation: @GUAROCC @  Subjective:  No chief complaint on file.   History of Present Illness: Emily Page is a 69 y.o. female ***   Activities of Daily Living:  Patient reports morning stiffness for *** {minute/hour:19697}.   Patient {ACTIONS;DENIES/REPORTS:21021675::"Denies"} nocturnal pain.  Difficulty dressing/grooming: {ACTIONS;DENIES/REPORTS:21021675::"Denies"} Difficulty climbing stairs: {ACTIONS;DENIES/REPORTS:21021675::"Denies"} Difficulty getting out of chair: {ACTIONS;DENIES/REPORTS:21021675::"Denies"} Difficulty using hands for taps, buttons, cutlery, and/or writing: {ACTIONS;DENIES/REPORTS:21021675::"Denies"}  No Rheumatology ROS completed.   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   OSA (obstructive sleep apnea) 10/28/2016   Excessive daytime sleepiness 04/22/2016   Exertional shortness of breath 07/14/2014   Palpitations 07/14/2014   Abnormal nuclear stress test 07/14/2014   Hay fever 04/14/2014   Allergic rhinitis 04/14/2014   Cervical pain 04/14/2014   Fatigue 04/14/2014   Acid reflux 04/14/2014   Current drug use 04/14/2014   Atypical migraine 04/14/2014   Arthralgia of hand 04/14/2014   Arthralgia of multiple joints 04/14/2014   Paroxysmal digital cyanosis 04/14/2014   Hypertension    Chest  pain, atypical 03/31/2014   PVC (premature ventricular contraction) 03/31/2014   Blood glucose elevated 02/02/2014    Past Medical History:  Diagnosis Date   Allergic rhinitis    followed by Dr 04/04/2014 in the past   Fibromyalgia    followed by Dr Willa Rough   Foot pain, right    w intermittent swelling felt to be related to how she is walking on her foot. evaluated by Dr Bedelia Person (podiatrist)   GERD (gastroesophageal reflux disease)    Followed by Dr Leticia Penna in the past   Hip dislocation, bilateral (HCC)    as infant. was corrected   History of EKG    w palpitations and nonspecific T-wave changes, stress cardiolite 6/08 showed normal LV size in a systolic function,no maximum effort stress test with moderate work load and good HR achieved,no EKG criteria of ischemia. Did have occassional PVC's   Hypertension    Migraines    follow[facility] past.    OSA (obstructive sleep apnea) 10/28/2016   Very mild   PVC (premature ventricular contraction)    Raynaud's disease    followed by Dr 14/02/2016 and norvasc has helped   Rosacea    and seborrheic dermatitis followed by Dr Corliss Skains    Family History  Problem Relation Age of Onset   Hypertension Mother    Heart attack Father    Hypertension Father    Hyperlipidemia Father    Past Surgical History:  Procedure Laterality Date   CARDIAC CATHETERIZATION     EYE SURGERY Left 10/2017   cataract extraction    HIP ARTHROPLASTY     HIP SURGERY     LEFT HEART CATHETERIZATION WITH CORONARY ANGIOGRAM N/A 07/28/2014  Procedure: LEFT HEART CATHETERIZATION WITH CORONARY ANGIOGRAM;  Surgeon: Marykay Lex, MD;  Location[facility] CATH LAB;  Service: Cardiovascular;  Laterality: N/A;   LEG SURGERY     SPLIT NIGHT STUDY  04/10/2016   TOTAL HIP ARTHROPLASTY Right    VAGINAL HYSTERECTOMY     Social History   Social History Narrative   Not on file   Immunization History  Administered Date(s)  Administered   Influenza,inj,Quad PF,6+ Mos 08/24/2015   PFIZER SARS-COV-2 Vaccination 01/03/2020, 01/28/2020     Objective: Vital Signs: There were no vitals taken for this visit.   Physical Exam   Musculoskeletal Exam: ***  CDAI Exam: CDAI Score: -- Patient Global: --; Provider Global: -- Swollen: --; Tender: -- Joint Exam 08/08/2020   No joint exam has been documented for this visit   There is currently no information documented on the homunculus. Go to the Rheumatology activity and complete the homunculus joint exam.  Investigation: No additional findings.  Imaging: No results found.  Recent Labs: Lab Results  Component Value Date   WBC 7.6 07/21/2014   HGB 13.9 07/21/2014   PLT 250.0 07/21/2014   NA 139 04/24/2017   K 4.2 04/24/2017   CL 99 04/24/2017   CO2 23 04/24/2017   GLUCOSE 108 (H) 04/24/2017   BUN 8 04/24/2017   CREATININE 1.02 (H) 10/19/2018   CALCIUM 9.5 04/24/2017   GFRAA >60 10/19/2018    Speciality Comments: No specialty comments available.  Procedures:  No procedures performed Allergies: Amoxil [amoxicillin], Penicillins, Tape, Sulfa antibiotics, Zanaflex [tizanidine], Polytrim [polymyxin b-trimethoprim], and Voltaren [diclofenac sodium]   Assessment / Plan:     Visit Diagnoses: No diagnosis found.  Orders: No orders of the defined types were placed in this encounter.  No orders of the defined types were placed in this encounter.   Face-to-face time spent with patient was *** minutes. Greater than 50% of time was spent in counseling and coordination of care.  Follow-Up Instructions: No follow-ups on file.   Gearldine Bienenstock, PA-C  Note - This record has been created using Dragon software.  Chart creation errors have been sought, but may not always  have been located. Such creation errors do not reflect on  the standard of medical care.

## 2020-08-02 ENCOUNTER — Other Ambulatory Visit: Payer: Self-pay | Admitting: Rheumatology

## 2020-08-02 NOTE — Telephone Encounter (Signed)
Last Visit: 04/06/2020 Next Visit: 08/08/2020  Last Fill: 05/09/2020  Okay to refill Cyclobenzaprine?

## 2020-08-06 IMAGING — MR MR CARD MORPHOLOGY WO/W CM
12 of 15 series · 37 of 40 positions shown · IV contrast (gadavist)
Comparison: none

CLINICAL DATA: Cardiomyopathy, PVCs

EXAM:
CARDIAC MRI
TECHNIQUE: The patient was scanned on a 1.5 Tesla GE magnet. A dedicated
cardiac coil was used. Functional imaging was done using Fiesta
sequences. [DATE], and 4 chamber views were done to assess for RWMA's.
Modified Sabo rule using a short axis stack was used to
calculate an ejection fraction on a dedicated work station using
Circle software. The patient received 7.5 cc of Gadavist. After 10
minutes inversion recovery sequences were used to assess for
infiltration and scar tissue.
CONTRAST:  7.5 cc Gadavist

[Series 8: bSSFP · oblique · 8.0mm · 1.43mm/px · 14 of 350 slices shown (1 of 5)]
[im 1/350]
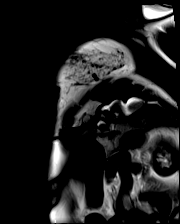
[im 27/350]
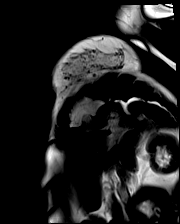
[im 54/350]
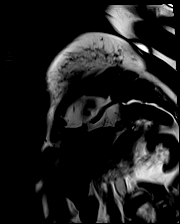
[im 81/350]
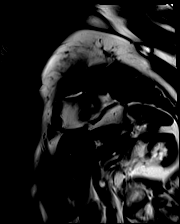
[im 108/350]
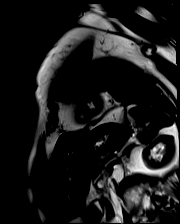
[im 135/350]
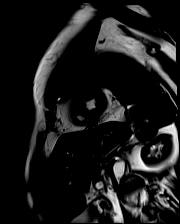
[im 162/350]
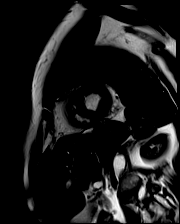
[im 188/350]
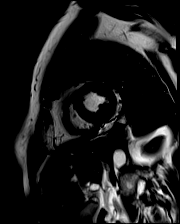
[im 215/350]
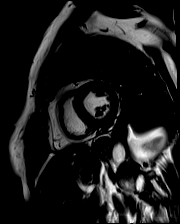
[im 242/350]
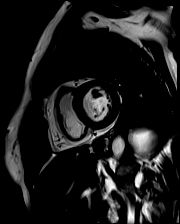
[im 269/350]
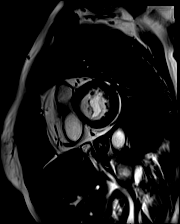
[im 296/350]
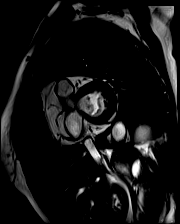
[im 323/350]
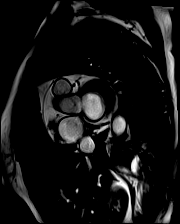
[im 350/350]
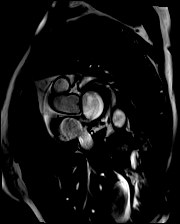

[Series 9: t1_tse_db axial · axial · 6.0mm · 1.32mm/px · 1 of 20 slices shown]
[im 1/20]
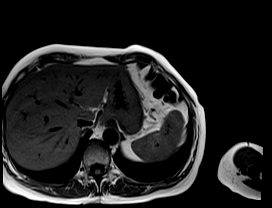

[Series 13: t2_stir_db axial · axial · 6.0mm · 1.73mm/px · 1 of 20 slices shown]
[im 1/20]
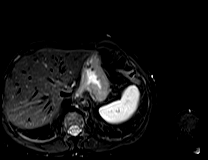

[Series 14: bSSFP · axial · 6.0mm · 1.43mm/px · z∈[-159,-93]mm · 13 of 300 slices shown (2 of 5)]
[im 1/300]
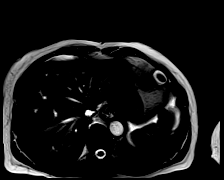
[im 25/300]
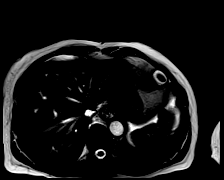
[im 50/300]
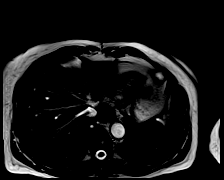
[im 75/300]
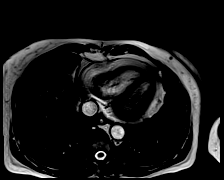
[im 100/300]
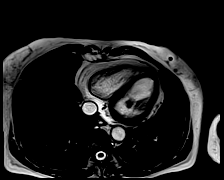
[im 125/300]
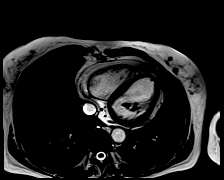
[im 150/300]
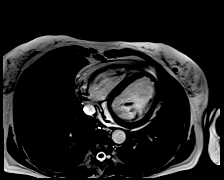
[im 175/300]
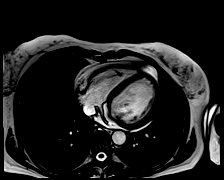
[im 200/300]
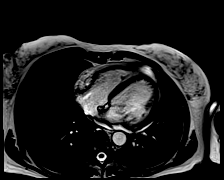
[im 225/300]
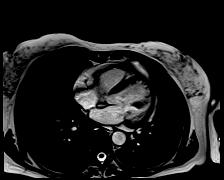
[im 250/300]
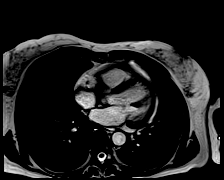
[im 275/300]
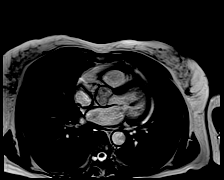
[im 300/300]
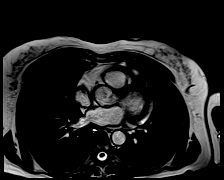

[Series 15: t1_tse_db sag · sagittal · 5.0mm · 1.18mm/px · 1 of 15 slices shown]
[im 1/15]
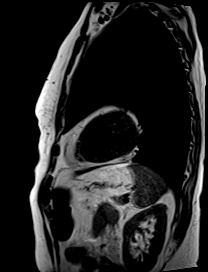

[Series 16: bSSFP · coronal · 6.0mm · 1.25mm/px · 1 of 25 slices shown (3 of 5)]
[im 1/25]
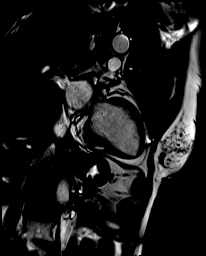

[Series 17: bSSFP · oblique · 6.0mm · 1.25mm/px · 1 of 25 slices shown (4 of 5)]
[im 1/25]
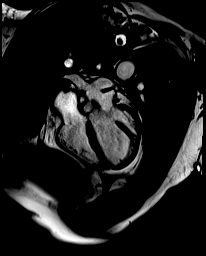

[Series 18: bSSFP · oblique · 6.0mm · 1.25mm/px · 1 of 25 slices shown (5 of 5)]
[im 1/25]
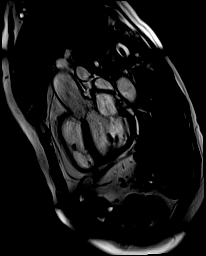

[Series 22: lge_single shot sa · oblique · 8.0mm · 1.98mm/px · 1 of 14 slices shown (1 of 2)]
[im 1/14]
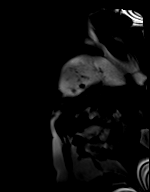

[Series 23: lge_single shot sa · oblique · 8.0mm · 1.98mm/px · 1 of 14 slices shown (2 of 2)]
[im 1/14]
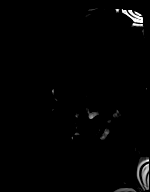

[Series 24: lge short axis_mag · oblique · 8.0mm · 1.50mm/px · 1 of 14 slices shown]
[im 1/14]
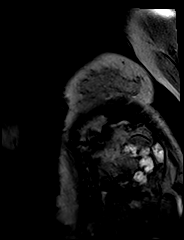

[Series 25: lge short axis_psir · oblique · 8.0mm · 1.50mm/px · 1 of 14 slices shown]
[im 1/14]
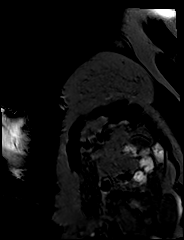

[37 of 40 positions shown; findings below may reference images not displayed]

FINDINGS: Limited images of the lung fields showed no gross abnormalities.

There was prominent epicardial adipose tissue. Normal left
ventricular size and wall thickness. Normal wall motion with EF 59%.
Normal right ventricular size and systolic function, EF 64%. No
regional RV wall motion abnormalities. No evidence for fibrofatty
infiltration of the RV. Normal left and right atrial sizes. No
significant mitral regurgitation noted. Trileaflet aortic valve with
no stenosis or significant regurgitation.

On delayed enhancement imaging, there was no myocardial late
gadolinium enhancement (LGE).

Measurements:
LVEDV 116 mL

LVSV 68 mL

LVEF 59%

RVEDV 77 mL

RVSV 50 mL

RVEF 64%
IMPRESSION: 1.  Normal LV size and systolic function, EF 59%.

2. Normal RV size and systolic function, EF 64%. No evidence for
ARVC.

3. No myocardial LGE, so no definitive evidence for prior MI,
infiltrative disease, or myocarditis.

Gwakkin Saccasan

## 2020-08-08 ENCOUNTER — Ambulatory Visit: Payer: Medicare Other | Admitting: Rheumatology

## 2020-08-12 ENCOUNTER — Telehealth: Payer: Self-pay | Admitting: Cardiology

## 2020-08-23 DIAGNOSIS — Z23 Encounter for immunization: Secondary | ICD-10-CM | POA: Diagnosis not present

## 2020-08-29 DIAGNOSIS — Z23 Encounter for immunization: Secondary | ICD-10-CM | POA: Diagnosis not present

## 2020-09-25 ENCOUNTER — Other Ambulatory Visit: Payer: Self-pay | Admitting: Physician Assistant

## 2020-09-25 NOTE — Telephone Encounter (Signed)
Last Visit: 04/06/2020 Next Visit: 08/08/2020  Last Fill: 08/02/2020  Okay to refill Cyclobenzaprine?

## 2020-10-08 ENCOUNTER — Other Ambulatory Visit: Payer: Self-pay | Admitting: Internal Medicine

## 2020-10-09 ENCOUNTER — Other Ambulatory Visit: Payer: Self-pay | Admitting: *Deleted

## 2020-12-12 ENCOUNTER — Other Ambulatory Visit: Payer: Self-pay | Admitting: Physician Assistant

## 2020-12-20 DIAGNOSIS — Z124 Encounter for screening for malignant neoplasm of cervix: Secondary | ICD-10-CM | POA: Diagnosis not present

## 2020-12-20 DIAGNOSIS — Z6825 Body mass index (BMI) 25.0-25.9, adult: Secondary | ICD-10-CM | POA: Diagnosis not present

## 2020-12-20 DIAGNOSIS — N958 Other specified menopausal and perimenopausal disorders: Secondary | ICD-10-CM | POA: Diagnosis not present

## 2020-12-20 DIAGNOSIS — Z1231 Encounter for screening mammogram for malignant neoplasm of breast: Secondary | ICD-10-CM | POA: Diagnosis not present

## 2021-01-02 ENCOUNTER — Other Ambulatory Visit: Payer: Self-pay | Admitting: Cardiology

## 2021-01-13 ENCOUNTER — Other Ambulatory Visit: Payer: Self-pay | Admitting: Cardiology

## 2021-03-15 ENCOUNTER — Other Ambulatory Visit: Payer: Self-pay | Admitting: Cardiology

## 2021-03-18 ENCOUNTER — Other Ambulatory Visit: Payer: Self-pay | Admitting: Physician Assistant

## 2021-03-18 NOTE — Telephone Encounter (Signed)
Please call patient to schedule f/u appt. Thank you.  Return in about 6 months (around 10/07/2020) for Fibromyalgia

## 2021-03-19 ENCOUNTER — Telehealth: Payer: Self-pay | Admitting: Cardiology

## 2021-03-19 MED ORDER — LISINOPRIL 40 MG PO TABS: 40.0000 mg | ORAL_TABLET | Freq: Every day | ORAL | 0 refills | Status: DC

## 2021-03-19 NOTE — Telephone Encounter (Signed)
*  STAT* If patient is at the pharmacy, call can be transferred to refill team.   1. Which medications need to be refilled? (please list name of each medication and dose if known)  lisinopril (ZESTRIL) 40 MG tablet  2. Which pharmacy/location (including street and city if local pharmacy) is medication to be sent to? [id number] - [place], [place] - [address]  3. Do they need a 30 day or 90 day supply? 90  Patient says that she has been out of medication for a week. Please send asap

## 2021-03-19 NOTE — Telephone Encounter (Signed)
Refilled medication.  Pt had appointment with Dr. Graciela Husbands 04/2020.  Has upcoming appointment with Dr. Mayford Knife scheduled for 04/25/2021.  Lisinopril refilled for 90 days, no refills.

## 2021-03-19 NOTE — Telephone Encounter (Signed)
Patient wanted to let you know she is going out of town Wednesday, and needs to pick up rx today or tomorrow. Patient scheduled a rov.

## 2021-03-20 DIAGNOSIS — Z23 Encounter for immunization: Secondary | ICD-10-CM | POA: Diagnosis not present

## 2021-04-04 ENCOUNTER — Other Ambulatory Visit: Payer: Self-pay | Admitting: Cardiology

## 2021-04-04 NOTE — Progress Notes (Signed)
Office Visit Note  Patient: Emily Page             Date of Birth: 01/08/1951           MRN: [id number]             PCP: Juluis Rainier, MD Referring: Juluis Rainier, MD Visit Date: 04/17/2021 Occupation: @GUAROCC @  Subjective:  Left hip replacement   History of Present Illness: Emily Page is a 70 y.o. female with history of fibromyalgia.  Patient continues to have intermittent myalgias and muscle tenderness due to underlying fibromyalgia.  She states that she ran out of her prescription for Flexeril so she started to have increased muscle spasms and muscle aches recently.  She typically takes flexeril 5 mg 1/2 tablet at bedtime for symptomatic relief.  She states flexeril helps her sleep at night as well. She would like a refill sent to the pharmacy today. She continues to have chronic lower back pain and stiffness.  Denies symptoms of radiculopathy. She states that she had her left hip replaced by Dr. 66 in July 2021 which has improved her discomfort significantly.  She states since having her left hip replaced both knee joints have improved.  She denies any knee joint swelling at this time.  She states that she developed a rash after using Voltaren gel so she has not been using any topical agents recently. She states her right hip replacement continues to do well.   She has ongoing sicca symptoms.  She uses biotene products and OTC artifical tears as needed for symptomatic relief.     Activities of Daily Living:  Patient reports morning stiffness for 30-60 minutes.   Patient Reports nocturnal pain.  Difficulty dressing/grooming: Denies Difficulty climbing stairs: Reports Difficulty getting out of chair: Reports Difficulty using hands for taps, buttons, cutlery, and/or writing: Denies  Review of Systems  Constitutional: Positive for fatigue.  HENT: Positive for mouth dryness. Negative for mouth sores and nose dryness.   Eyes: Positive for pain, visual  disturbance and dryness.  Respiratory: Positive for cough and shortness of breath. Negative for hemoptysis and difficulty breathing.   Cardiovascular: Positive for chest pain, palpitations, hypertension and swelling in legs/feet.  Gastrointestinal: Negative for blood in stool, constipation and diarrhea.  Endocrine: Negative for increased urination.  Genitourinary: Negative for painful urination.  Musculoskeletal: Positive for arthralgias, joint pain, joint swelling, myalgias, muscle weakness, morning stiffness and myalgias. Negative for muscle tenderness.  Skin: Negative for color change, pallor, rash, hair loss, nodules/bumps, skin tightness, ulcers and sensitivity to sunlight.  Allergic/Immunologic: Negative for susceptible to infections.  Neurological: Positive for dizziness, numbness and weakness. Negative for headaches.  Hematological: Negative for swollen glands.  Psychiatric/Behavioral: Positive for sleep disturbance. Negative for depressed mood. The patient is not nervous/anxious.     PMFS History:  Patient Active Problem List   Diagnosis Date Noted  . Dysautonomia (HCC) 12/08/2019  . Cardiomyopathy (HCC) 12/08/2019  . Tachycardia 12/08/2019  . Fibromyalgia 03/06/2017  . Sicca (HCC) 03/06/2017  . History of Raynaud's syndrome 03/06/2017  . History of total hip replacement, right 03/06/2017  . Vitamin D deficiency 03/06/2017  . History of migraine 03/06/2017  . Vasovagal syncope 03/06/2017  . 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 OSA (obstructive sleep apnea) 10/28/2016  . Excessive daytime sleepiness 04/22/2016  . Exertional shortness of breath 07/14/2014  . Palpitations 07/14/2014  . Abnormal nuclear stress test 07/14/2014  . Hay fever 04/14/2014  . Allergic rhinitis 04/14/2014  .  Cervical pain 04/14/2014  . Fatigue 04/14/2014  . Acid reflux 04/14/2014  . Current drug use 04/14/2014  . Atypical migraine 04/14/2014  . Arthralgia of hand 04/14/2014   . Arthralgia of multiple joints 04/14/2014  . Paroxysmal digital cyanosis 04/14/2014  . Hypertension   . Chest pain, atypical 03/31/2014  . PVC (premature ventricular contraction) 03/31/2014  . Blood glucose elevated 02/02/2014    Past Medical History:  Diagnosis Date  . Allergic rhinitis    followed by Dr Willa Rough in the past  . Fibromyalgia    followed by Dr Bedelia Person  . Foot pain, right    w intermittent swelling felt to be related to how she is walking on her foot. evaluated by Dr Leticia Penna (podiatrist)  . GERD (gastroesophageal reflux disease)    Followed by Dr Randa Evens in the past  . 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 History of EKG    w palpitations and nonspecific T-wave changes, stress cardiolite 6/08 showed normal LV size in a systolic function,no maximum effort stress test with moderate work load and good HR achieved,no EKG criteria of ischemia. Did have occassional PVC's  . Hypertension   . Migraines    followed by [facility] in past.   . OSA (obstructive sleep apnea) 10/28/2016   Very mild  . PVC (premature ventricular contraction)   . Raynaud's disease    followed by Dr Corliss Skains and norvasc has helped  . Rosacea    and seborrheic dermatitis followed by Dr Levy Sjogren    Family History  Problem Relation Age of Onset  . Hypertension Mother   . Heart attack Father   . Hypertension Father   . Hyperlipidemia Father    Past Surgical History:  Procedure Laterality Date  . CARDIAC CATHETERIZATION    . EYE SURGERY Left 10/2017   cataract extraction   . HIP ARTHROPLASTY    . HIP SURGERY    . LEFT HEART CATHETERIZATION WITH CORONARY ANGIOGRAM N/A 07/28/2014   Procedure: LEFT HEART CATHETERIZATION WITH CORONARY ANGIOGRAM;  Surgeon: Marykay Lex, MD;  Location: [facility] CATH LAB;  Service: Cardiovascular;  Laterality: N/A;  . LEG SURGERY    . SPLIT NIGHT STUDY  04/10/2016  . TOTAL HIP ARTHROPLASTY Right   . TOTAL HIP ARTHROPLASTY Left  05/2020   Dr. Lequita Halt  . VAGINAL HYSTERECTOMY     Social History   Social History Narrative  . Not on file   Immunization History  Administered Date(s) Administered  . Influenza,inj,Quad PF,6+ Mos 08/24/2015  . PFIZER(Purple Top)SARS-COV-2 Vaccination 01/03/2020, 01/28/2020     Objective: Vital Signs: BP (!) 158/83 (BP Location: Left Arm, Patient Position: Sitting, Cuff Size: Normal)   Pulse (!) 59   Ht 5\' 2"  (1.575 m)   Wt 143 lb 12.8 oz (65.2 kg)   BMI 26.30 kg/m    Physical Exam Vitals and nursing note reviewed.  Constitutional:      Appearance: She is well-developed.  HENT:     Head: Normocephalic and atraumatic.  Eyes:     Conjunctiva/sclera: Conjunctivae normal.  Pulmonary:     Effort: Pulmonary effort is normal.  Abdominal:     Palpations: Abdomen is soft.  Musculoskeletal:     Cervical back: Normal range of motion.  Skin:    General: Skin is warm and dry.     Capillary Refill: Capillary refill takes less than 2 seconds.  Neurological:     Mental Status: She is alert and oriented  to person, place, and time.  Psychiatric:        Behavior: Behavior normal.      Musculoskeletal Exam: C-spine, thoracic spine, and lumbar spine good ROM. Midline spinal tenderness in the lumbar region. Shoulder joints, elbow joints, wrist joints, MCPs, PIPs, and DIPs good ROM with no synovitis.  Complete fist formation bilaterally.  Bilateral hip replacements have good ROM with some discomfort in the left hip.  Knee joints good ROM with no warmth or effusion. Bilateral knee crepitus noted.  Ankle joints good ROM with no tenderness or joint swelling. No evidence of achilles tendonitis or plantar fasciitis. Tenderness over bilateral trochanteric bursa.   CDAI Exam: CDAI Score: -- Patient Global: --; Provider Global: -- Swollen: --; Tender: -- Joint Exam 04/17/2021   No joint exam has been documented for this visit   There is currently no information documented on the homunculus.  Go to the Rheumatology activity and complete the homunculus joint exam.  Investigation: No additional findings.  Imaging: No results found.  Recent Labs: Lab Results  Component Value Date   WBC 7.6 07/21/2014   HGB 13.9 07/21/2014   PLT 250.0 07/21/2014   NA 139 04/24/2017   K 4.2 04/24/2017   CL 99 04/24/2017   CO2 23 04/24/2017   GLUCOSE 108 (H) 04/24/2017   BUN 8 04/24/2017   CREATININE 1.02 (H) 10/19/2018   CALCIUM 9.5 04/24/2017   GFRAA >60 10/19/2018    Speciality Comments: No specialty comments available.  Procedures:  No procedures performed Allergies: Amoxil [amoxicillin], Penicillins, Tape, Sulfa antibiotics, Zanaflex [tizanidine], Polytrim [polymyxin b-trimethoprim], and Voltaren [diclofenac sodium]   Assessment / Plan:     Visit Diagnoses: Fibromyalgia: She continues to experience intermittent myalgias and muscle tenderness due to fibromyalgia.  She has ongoing trapezius muscle tension and muscle tenderness bilaterally.  She recently had a fibromyalgia flare due to being out of her prescription for Flexeril.  A refill of Flexeril 5 mg 1/2 tablet at bedtime as needed was sent to the pharmacy today.  We discussed the importance of regular exercise and good sleep hygiene.  She was strongly encouraged to try water aerobics or water therapy.  She declined a referral at this time.  She will follow-up in the office in 6 months.  Raynaud's disease without gangrene: Not currently active.  No digital ulcerations or signs of gangrene were noted.  No signs of sclerodactyly noted.  She is taking amlodipine 2.5 mg 1 tablet by mouth daily and remains on aspirin 81 mg 1 tablet daily.  Sicca syndrome [facility]): She continues to have chronic sicca symptoms.  She uses Biotene products as well as artificial tears as needed for symptomatic relief.  Chronic pain of left knee: Improved.  She has good range of motion of the left knee joint with no discomfort.  No warmth or effusion was noted.   Overall her left knee joint pain has improved since undergoing a left hip arthroplasty in July 2021.  History of total left hip arthroplasty -Performed by Dr. Lequita Halt in July 2021.  Doing well.  She has good range of motion with mild discomfort on examination today.  She has an upcoming appointment with Dr. Lequita Halt in July 2022.  History of total hip replacement, right: Doing well.  She has good range of motion with no discomfort.  Chronic midline low back pain with left-sided sciatica: She experiences occasional lower back pain and stiffness.  She has no symptoms of sciatica at this time.  Her discomfort is  alleviated by taking Flexeril 5 mg half tablet by mouth at bedtime for muscle spasms and insomnia.  History of vitamin D deficiency: She is taking a calcium and vitamin D supplement on a daily basis.  Other medical conditions are listed as follows:  History of hypertension  History of migraine  Anterior basement membrane dystrophy, unspecified laterality  Holmes-Adie syndrome, unspecified laterality  Orders: No orders of the defined types were placed in this encounter.  Meds ordered this encounter  Medications  . cyclobenzaprine (FLEXERIL) 5 MG tablet    Sig: TAKE 1/2 TABLET (2.5 MG TOTAL) BY MOUTH AT BEDTIME.    Dispense:  30 tablet    Refill:  1      Follow-Up Instructions: Return in about 6 months (around 10/18/2021) for Fibromyalgia.   Gearldine Bienenstock, PA-C  Note - This record has been created using Dragon software.  Chart creation errors have been sought, but may not always  have been located. Such creation errors do not reflect on  the standard of medical care.

## 2021-04-17 ENCOUNTER — Ambulatory Visit (INDEPENDENT_AMBULATORY_CARE_PROVIDER_SITE_OTHER): Payer: Medicare Other | Admitting: Physician Assistant

## 2021-04-17 ENCOUNTER — Encounter: Payer: Self-pay | Admitting: Physician Assistant

## 2021-04-17 ENCOUNTER — Other Ambulatory Visit: Payer: Self-pay

## 2021-04-17 VITALS — BP 158/83 | HR 59 | Ht 62.0 in | Wt 143.8 lb

## 2021-04-17 DIAGNOSIS — Z96642 Presence of left artificial hip joint: Secondary | ICD-10-CM | POA: Diagnosis not present

## 2021-04-17 DIAGNOSIS — Z8669 Personal history of other diseases of the nervous system and sense organs: Secondary | ICD-10-CM

## 2021-04-17 DIAGNOSIS — M25552 Pain in left hip: Secondary | ICD-10-CM

## 2021-04-17 DIAGNOSIS — M5442 Lumbago with sciatica, left side: Secondary | ICD-10-CM | POA: Diagnosis not present

## 2021-04-17 DIAGNOSIS — Z8639 Personal history of other endocrine, nutritional and metabolic disease: Secondary | ICD-10-CM | POA: Diagnosis not present

## 2021-04-17 DIAGNOSIS — Z8679 Personal history of other diseases of the circulatory system: Secondary | ICD-10-CM

## 2021-04-17 DIAGNOSIS — Z96641 Presence of right artificial hip joint: Secondary | ICD-10-CM

## 2021-04-17 DIAGNOSIS — H18529 Epithelial (juvenile) corneal dystrophy, unspecified eye: Secondary | ICD-10-CM

## 2021-04-17 DIAGNOSIS — I7300 Raynaud's syndrome without gangrene: Secondary | ICD-10-CM

## 2021-04-17 DIAGNOSIS — M797 Fibromyalgia: Secondary | ICD-10-CM

## 2021-04-17 DIAGNOSIS — M3500 Sicca syndrome, unspecified: Secondary | ICD-10-CM | POA: Diagnosis not present

## 2021-04-17 DIAGNOSIS — H57059 Tonic pupil, unspecified eye: Secondary | ICD-10-CM | POA: Diagnosis not present

## 2021-04-17 DIAGNOSIS — M25562 Pain in left knee: Secondary | ICD-10-CM

## 2021-04-17 DIAGNOSIS — G8929 Other chronic pain: Secondary | ICD-10-CM

## 2021-04-17 MED ORDER — CYCLOBENZAPRINE HCL 5 MG PO TABS: ORAL_TABLET | ORAL | 1 refills | Status: DC

## 2021-04-25 ENCOUNTER — Ambulatory Visit: Payer: Medicare Other | Admitting: Cardiology

## 2021-04-25 ENCOUNTER — Ambulatory Visit (INDEPENDENT_AMBULATORY_CARE_PROVIDER_SITE_OTHER): Payer: Medicare Other | Admitting: Cardiology

## 2021-04-25 ENCOUNTER — Other Ambulatory Visit: Payer: Self-pay

## 2021-04-25 ENCOUNTER — Encounter: Payer: Self-pay | Admitting: Cardiology

## 2021-04-25 VITALS — BP 120/70 | HR 60 | Ht 63.0 in | Wt 140.0 lb

## 2021-04-25 DIAGNOSIS — I10 Essential (primary) hypertension: Secondary | ICD-10-CM

## 2021-04-25 DIAGNOSIS — I493 Ventricular premature depolarization: Secondary | ICD-10-CM | POA: Diagnosis not present

## 2021-04-25 DIAGNOSIS — G8929 Other chronic pain: Secondary | ICD-10-CM

## 2021-04-25 DIAGNOSIS — G4733 Obstructive sleep apnea (adult) (pediatric): Secondary | ICD-10-CM

## 2021-04-25 DIAGNOSIS — R079 Chest pain, unspecified: Secondary | ICD-10-CM

## 2021-04-25 DIAGNOSIS — G901 Familial dysautonomia [Riley-Day]: Secondary | ICD-10-CM | POA: Diagnosis not present

## 2021-04-25 NOTE — Progress Notes (Signed)
Cardiology Office Note    Date:  04/25/2021   ID:  Emily Page, DOB December 30, 1950, MRN [id number]  PCP:  Juluis Rainier, MD  Cardiologist:  Armanda Magic, MD   Chief Complaint  Patient presents with  . Follow-up    Dysautonomia, PVCs, chronic CP/SOB, HTN    History of Present Illness:  Emily Page is a 70 y.o. female with a history of PVC's, noncardiac CP with normal coronary arteries on cath 07/2014 and HTN.  She had a cardiopulmonary stress test done which showed a mild functional capacity consistent with primary cardiac limitation.  She was placed in Cardiac Rehab for 3 months but did not notice that she had any improvement in the CP. She was seen by Dr. Gala Romney and all cardiac workup was normal and he felt that she was deconditioned and needed aerobic training.  She did have a sleep study that showed very mild OSA.  She has had some sx of possible POTs and is followed by Dr. Graciela Husbands. She was seen by Dr. Graciela Husbands recently and complained of more palpitations and he ordered a heart monitor but she could not wear it due to severe allergic reaction to the patches.    She is here today for followup and is doing well.  She denies any PND, orthopnea,  dizziness or syncope. She still has palpitations but she thinks they have gotten worse.  She has chronic chest pain, LE edema and DOE that seems to be stable.  She is compliant with her meds and is tolerating meds with no SE.    Past Medical History:  Diagnosis Date  . Allergic rhinitis    followed by Dr Willa Rough in the past  . Fibromyalgia    followed by Dr Bedelia Person  . Foot pain, right    w intermittent swelling felt to be related to how she is walking on her foot. evaluated by Dr Leticia Penna (podiatrist)  . GERD (gastroesophageal reflux disease)    Followed by Dr Randa Evens in the past  . 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 History of EKG    w palpitations and nonspecific T-wave changes, stress cardiolite  6/08 showed normal LV size in a systolic function,no maximum effort stress test with moderate work load and good HR achieved,no EKG criteria of ischemia. Did have occassional PVC's  . Hypertension   . Migraines    follow[facility] in past.   . OSA (obstructive sleep apnea) 10/28/2016   Very mild  . PVC (premature ventricular contraction)   . Raynaud's disease    followed by Dr Corliss Skains and norvasc has helped  . Rosacea    and seborrheic dermatitis followed by Dr Levy Sjogren    Past Surgical History:  Procedure Laterality Date  . CARDIAC CATHETERIZATION    . EYE SURGERY Left 10/2017   cataract extraction   . HIP ARTHROPLASTY    . HIP SURGERY    . LEFT HEART CATHETERIZATION WITH CORONARY ANGIOGRAM N/A 07/28/2014   Procedure: LEFT HEART CATHETERIZATION WITH CORONARY ANGIOGRAM;  Surgeon: Marykay Lex, MD;  Location: [facility] CATH LAB;  Service: Cardiovascular;  Laterality: N/A;  . LEG SURGERY    . SPLIT NIGHT STUDY  04/10/2016  . TOTAL HIP ARTHROPLASTY Right   . TOTAL HIP ARTHROPLASTY Left 05/2020   Dr. Lequita Halt  . VAGINAL HYSTERECTOMY      Current Medications: Outpatient Medications Prior to Visit  Medication Sig Dispense Refill  . amLODipine (  NORVASC) 2.5 MG tablet TAKE 1 TABLET BY MOUTH EVERY DAY 90 tablet 3  . aspirin EC 81 MG tablet Take 81 mg by mouth every evening.    Marland Kitchen CALCIUM PO Take 600 mg by mouth daily.     . Cholecalciferol (VITAMIN D) 125 MCG (5000 UT) CAPS Take 1 capsule by mouth daily.    . clindamycin (CLEOCIN) 150 MG capsule Take 150 mg by mouth 4 (four) times daily. As needed for dental procedures.    . cyclobenzaprine (FLEXERIL) 5 MG tablet TAKE 1/2 TABLET (2.5 MG TOTAL) BY MOUTH AT BEDTIME. 30 tablet 1  . DENTA 5000 PLUS 1.1 % CREA dental cream See admin instructions.    Marland Kitchen esomeprazole (NEXIUM) 40 MG capsule Take 40 mg by mouth daily at 12 noon.    Marland Kitchen estradiol (ESTRACE) 2 MG tablet Take 2 mg by mouth daily.    . fluconazole (DIFLUCAN) 150 MG tablet  Take 150 mg by mouth daily as needed (for yeast infection).     . fluticasone (FLONASE) 50 MCG/ACT nasal spray     . hydrALAZINE (APRESOLINE) 25 MG tablet Take 1 tablet (25 mg total) by mouth 2 (two) times daily. Please keep upcoming appt for future refills. 180 tablet 0  . INVELTYS 1 % SUSP INSTILL 1 DROP IN EYE(S) 3 TIMES A DAY FOR 2 WEEKS  1  . ketoconazole (NIZORAL) 2 % cream Apply 1 application topically daily.    Marland Kitchen ketoconazole (NIZORAL) 2 % shampoo Apply 1 application topically daily.     Marland Kitchen lisinopril (ZESTRIL) 40 MG tablet Take 1 tablet (40 mg total) by mouth daily. Please keep appt with Dr. Mayford Knife.  TY. 90 tablet 0  . Magnesium Malate 1250 (141.7 Mg) MG TABS Take 2 tablets by mouth daily.    . metoprolol succinate (TOPROL-XL) 100 MG 24 hr tablet Take 1 tablet (100 mg total) by mouth daily. Please keep upcoming appt for future refills. 90 tablet 0  . metroNIDAZOLE (METROCREAM) 0.75 % cream Apply 1 application topically 2 (two) times daily.    Marland Kitchen nystatin-triamcinolone ointment (MYCOLOG) Apply 1 application topically daily as needed (to rash).     . Probiotic Product (PROBIOTIC PO) Take by mouth daily.     No facility-administered medications prior to visit.     Allergies:   Amoxil [amoxicillin], Penicillins, Tape, Sulfa antibiotics, Zanaflex [tizanidine], Polytrim [polymyxin b-trimethoprim], and Voltaren [diclofenac sodium]   Social History   Socioeconomic History  . Marital status: Married    Spouse name: Not on file  . Number of children: Not on file  . Years of education: Not on file  . Highest education level: Not on file  Occupational History  . Not on file  Tobacco Use  . Smoking status: Never Smoker  . Smokeless tobacco: Never Used  . Tobacco comment: never used tobacco  Vaping Use  . Vaping Use: Never used  Substance and Sexual Activity  . Alcohol use: Yes    Alcohol/week: 7.0 standard drinks    Types: 7 Glasses of wine per week    Comment: 1-2 daily, wine  .  Drug use: Never  . Sexual activity: Not on file  Other Topics Concern  . Not on file  Social History Narrative  . Not on file   Social Determinants of Health   Financial Resource Strain: Not on file  Food Insecurity: Not on file  Transportation Needs: Not on file  Physical Activity: Not on file  Stress: Not on file  Social  Connections: Not on file     Family History:  The patient's family history includes Heart attack in her father; Hyperlipidemia in her father; Hypertension in her father and mother.   ROS:   Please see the history of present illness.    ROS All other systems reviewed and are negative.  No flowsheet data found.     PHYSICAL EXAM:   VS:  BP 120/70   Pulse 60   Ht 5\' 3"  (1.6 m)   Wt 140 lb (63.5 kg)   SpO2 98%   BMI 24.80 kg/m    GEN: Well nourished, well developed in no acute distress HEENT: Normal NECK: No JVD; No carotid bruits LYMPHATICS: No lymphadenopathy CARDIAC:RRR, no murmurs, rubs, gallops RESPIRATORY:  Clear to auscultation without rales, wheezing or rhonchi  ABDOMEN: Soft, non-tender, non-distended MUSCULOSKELETAL:  No edema; No deformity  SKIN: Warm and dry NEUROLOGIC:  Alert and oriented x 3 PSYCHIATRIC:  Normal affect    Wt Readings from Last 3 Encounters:  04/25/21 140 lb (63.5 kg)  04/17/21 143 lb 12.8 oz (65.2 kg)  04/27/20 130 lb (59 kg)      Studies/Labs Reviewed:   EKG:  EKG is ordered today.  The ekg ordered today demonstrates NSR at 77bpm with nonspecific ST abnormality with PVC  Recent Labs: No results found for requested labs within last 8760 hours.   Lipid Panel No results found for: CHOL, TRIG, HDL, CHOLHDL, VLDL, LDLCALC, LDLDIRECT  Additional studies/ records that were reviewed today include:  none    ASSESSMENT:    1. Primary hypertension   2. PVC (premature ventricular contraction)   3. OSA (obstructive sleep apnea)   4. Chronic chest pain   5. Dysautonomia (HCC)      PLAN:  In order of  problems listed above:  HTN  -BP is adequately controlled on exam today -Continue prescription drug management with amlodipine 2.5mg  daily, Hydralazine 25mg  BID, Lisinopril 40mg  daily and Toprol XL 100mg  daily -I have personally reviewed and interpreted outside labs performed by patient's PCP which showed SCr 0.83, K+ 4.5 and TSH 1.7 in July 2021  PVCs  -she is having more palpitations recently but cannot wear an event monitor due to allergic rx to the patches -She has a and I have asked her to try to catch her arrhythmias on her device and send them on My Chart to Dr. -continue Toprol XL 100mg  daily   OSA - this is very mild and I do not think that it is the reason for her chronic fatigue/SOB and CP.  Chronic CP and SOB  -normal cardiac workup including cardiopulmonary stress test and cath. -this is a chronic problem that has been going on for years with negative extensive cardiac workup   Dysautonomia -negative heart monitor in the past -? POTS followed by Dr.   Medication Adjustments/Labs and Tests Ordered: Current medicines are reviewed at length with the patient today.  Concerns regarding medicines are outlined above.  Medication changes, Labs and Tests ordered today are listed in the Patient Instructions below.  There are no Patient Instructions on file for this visit.   Signed, August 2021, MD  04/25/2021 2:19 PM    [facility] [address] [place], [place], 06/25/2021  [facility] Phone: [phone]; Fax: [phone]

## 2021-04-25 NOTE — Patient Instructions (Signed)
Medication Instructions:  Your physician recommends that you continue on your current medications as directed. Please refer to the Current Medication list given to you today.  *If you need a refill on your cardiac medications before your next appointment, please call your pharmacy*  Follow-Up: At [facility], you and your health needs are our priority.  As part of our continuing mission to provide you with exceptional heart care, we have created designated Provider Care Teams.  These Care Teams include your primary Cardiologist (physician) and Advanced Practice Providers (APPs -  Physician Assistants and Nurse Practitioners) who all work together to provide you with the care you need, when you need it.  Your next appointment:   Follow up with Dr. Graciela Husbands as scheduled.

## 2021-06-16 ENCOUNTER — Other Ambulatory Visit: Payer: Self-pay | Admitting: Internal Medicine

## 2021-06-26 ENCOUNTER — Other Ambulatory Visit: Payer: Self-pay | Admitting: Cardiology

## 2021-08-08 DIAGNOSIS — Z96643 Presence of artificial hip joint, bilateral: Secondary | ICD-10-CM | POA: Diagnosis not present

## 2021-08-08 DIAGNOSIS — Z96641 Presence of right artificial hip joint: Secondary | ICD-10-CM | POA: Diagnosis not present

## 2021-08-08 DIAGNOSIS — Z96642 Presence of left artificial hip joint: Secondary | ICD-10-CM | POA: Diagnosis not present

## 2021-08-09 ENCOUNTER — Encounter: Payer: Self-pay | Admitting: *Deleted

## 2021-08-09 NOTE — Telephone Encounter (Signed)
Spoke with and advised her we will provide a letter for jury duty. Patient advised she may pick up the signed copy tomorrow. Patient advised she may also want to obtain a note from her orthopedic surgeon to further support her request to be exempt from jury duty. Patient expressed understanding.

## 2021-08-09 NOTE — Telephone Encounter (Signed)
Ok to provide note stating the patient has a difficult time walking, standing, or sitting for prolonged periods of time.   She may also want to obtain a note from her orthopedic surgeon to further support her request to be exempt from jury duty.

## 2021-08-19 ENCOUNTER — Other Ambulatory Visit: Payer: Self-pay | Admitting: Physician Assistant

## 2021-08-20 DIAGNOSIS — Z23 Encounter for immunization: Secondary | ICD-10-CM | POA: Diagnosis not present

## 2021-08-20 NOTE — Telephone Encounter (Signed)
Next Visit: 10/23/2021  Last Visit: 04/17/2021  Last Fill: 04/17/2021  Dx:  Fibromyalgia  Current Dose per office note on 04/17/2021:  Flexeril 5 mg 1/2 tablet at bedtime as needed  Okay to refill Flexeril?

## 2021-09-04 ENCOUNTER — Other Ambulatory Visit: Payer: Self-pay

## 2021-09-04 MED ORDER — AMLODIPINE BESYLATE 2.5 MG PO TABS: 2.5000 mg | ORAL_TABLET | Freq: Every day | ORAL | 0 refills | Status: DC

## 2021-09-05 DIAGNOSIS — Z23 Encounter for immunization: Secondary | ICD-10-CM | POA: Diagnosis not present

## 2021-09-10 ENCOUNTER — Other Ambulatory Visit: Payer: Self-pay | Admitting: Cardiology

## 2021-09-20 ENCOUNTER — Other Ambulatory Visit: Payer: Self-pay | Admitting: Internal Medicine

## 2021-09-26 DIAGNOSIS — H35372 Puckering of macula, left eye: Secondary | ICD-10-CM | POA: Diagnosis not present

## 2021-09-26 DIAGNOSIS — H18593 Other hereditary corneal dystrophies, bilateral: Secondary | ICD-10-CM | POA: Diagnosis not present

## 2021-09-26 DIAGNOSIS — Z961 Presence of intraocular lens: Secondary | ICD-10-CM | POA: Diagnosis not present

## 2021-09-26 DIAGNOSIS — H2511 Age-related nuclear cataract, right eye: Secondary | ICD-10-CM | POA: Diagnosis not present

## 2021-10-09 NOTE — Progress Notes (Signed)
Office Visit Note  Patient: Emily Page             Date of Birth: [birth year]           MRN: [id number]             PCP: Duke Salvia, MD Referring: Juluis Rainier, MD Visit Date: 10/23/2021 Occupation: @GUAROCC @  Subjective:  Pain in all the joints and muscles   History of Present Illness: Emily Page is a 70 y.o. female with a history of fibromyalgia syndrome, osteoarthritis and sicca symptoms.  She states she has been having increased flares of fibromyalgia with pain in her trapezius thoracic region.  She has been taking muscle relaxer which has been helpful.  She denies any side effects from the muscle relaxer.  She had bilateral hip replacement and continues to have some stiffness in her hip joints.  She is off-and-on discomfort in her knee joints but denies any knee joint swelling.  She continues to have dry mouth and dry eye symptoms.  She has been using eyedrops which has been helpful.  Her Raynauds symptoms are more active during the winter months.  Activities of Daily Living:  Patient reports morning stiffness for 0 minutes.   Patient Denies nocturnal pain.  Difficulty dressing/grooming: Denies Difficulty climbing stairs: Denies Difficulty getting out of chair: Denies Difficulty using hands for taps, buttons, cutlery, and/or writing: Denies  Review of Systems  Constitutional:  Positive for fatigue.  HENT:  Positive for mouth dryness and nose dryness. Negative for mouth sores.   Eyes:  Positive for itching and dryness.  Respiratory:  Negative for difficulty breathing.   Cardiovascular:  Positive for palpitations. Negative for chest pain.  Gastrointestinal:  Negative for blood in stool, constipation and diarrhea.  Endocrine: Negative for increased urination.  Genitourinary:  Negative for difficulty urinating.  Musculoskeletal:  Positive for joint pain, joint pain, myalgias, muscle tenderness and myalgias. Negative for joint swelling and morning stiffness.   Skin:  Positive for color change. Negative for redness and sensitivity to sunlight.  Allergic/Immunologic: Negative for susceptible to infections.  Neurological:  Positive for dizziness, numbness and weakness. Negative for headaches and memory loss.  Hematological:  Negative for bruising/bleeding tendency and swollen glands.  Psychiatric/Behavioral:  Negative for confusion.    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   OSA (obstructive sleep apnea) 10/28/2016   Excessive daytime sleepiness 04/22/2016   Exertional shortness of breath 07/14/2014   Palpitations 07/14/2014   Abnormal nuclear stress test 07/14/2014   Hay fever 04/14/2014   Allergic rhinitis 04/14/2014   Cervical pain 04/14/2014   Fatigue 04/14/2014   Acid reflux 04/14/2014   Current drug use 04/14/2014   Atypical migraine 04/14/2014   Arthralgia of hand 04/14/2014   Arthralgia of multiple joints 04/14/2014   Paroxysmal digital cyanosis 04/14/2014   Hypertension    Chest pain, atypical 03/31/2014   PVC (premature ventricular contraction) 03/31/2014   Blood glucose elevated 02/02/2014    Past Medical History:  Diagnosis Date   Allergic rhinitis    followed by Dr 04/04/2014 in the past  Fibromyalgia    followed by Dr Dora Sims   Foot pain, right    w intermittent swelling felt to be related to how she is walking on her foot. evaluated by Dr Geroge Baseman (podiatrist)   GERD (gastroesophageal reflux disease)    Followed by Dr Oletta Lamas in the past   Hip dislocation, bilateral ([place])    as infant. was  corrected   History of EKG    w palpitations and nonspecific T-wave changes, stress cardiolite 6/08 showed normal LV size in a systolic function,no maximum effort stress test with moderate work load and good HR achieved,no EKG criteria of ischemia. Did have occassional PVC's   Hypertension    Migraines    followed by [facility] in past.    Ocular rosacea    per patient   OSA (obstructive sleep apnea) 10/28/2016   Very mild   PVC (premature ventricular contraction)    Raynaud's disease    followed by Dr Estanislado Pandy and norvasc has helped   Rosacea    and seborrheic dermatitis followed by Dr Althia Forts    Family History  Problem Relation Age of Onset   Hypertension Mother    Heart attack Father    Hypertension Father    Hyperlipidemia Father    Past Surgical History:  Procedure Laterality Date   CARDIAC CATHETERIZATION     EYE SURGERY Left 10/2017   cataract extraction    HIP ARTHROPLASTY     HIP SURGERY     LEFT HEART CATHETERIZATION WITH CORONARY ANGIOGRAM N/A 07/28/2014   Procedure: LEFT HEART CATHETERIZATION WITH CORONARY ANGIOGRAM;  Surgeon: Leonie Man, MD;  Location: [facility] CATH LAB;  Service: Cardiovascular;  Laterality: N/A;   LEG SURGERY     SPLIT NIGHT STUDY  04/10/2016   TOTAL HIP ARTHROPLASTY Right    TOTAL HIP ARTHROPLASTY Left 05/2020   Dr. Wynelle Link   VAGINAL HYSTERECTOMY     Social History   Social History Narrative   Not on file   Immunization History  Administered Date(s) Administered   Influenza,inj,Quad PF,6+ Mos 08/24/2015   PFIZER(Purple Top)SARS-COV-2 Vaccination 01/03/2020, 01/28/2020     Objective: Vital Signs: BP (!) 157/75 (BP Location: Left Arm, Patient Position: Sitting, Cuff Size: Normal)   Pulse 67   Ht 5' 2.5" (1.588 m)   Wt 146 lb 3.2 oz (66.3 kg)   BMI 26.31 kg/m    Physical Exam Vitals and nursing note reviewed.  Constitutional:      Appearance: She is well-developed.  HENT:     Head: Normocephalic and atraumatic.   Eyes:     Conjunctiva/sclera: Conjunctivae normal.  Cardiovascular:     Rate and Rhythm: Normal rate and regular rhythm.     Heart sounds: Normal heart sounds.  Pulmonary:     Effort: Pulmonary effort is normal.     Breath sounds: Normal breath sounds.  Abdominal:     General: Bowel sounds are normal.     Palpations: Abdomen is soft.  Musculoskeletal:     Cervical back: Normal range of motion.  Lymphadenopathy:     Cervical: No cervical adenopathy.  Skin:    General: Skin is warm and dry.     Capillary Refill: Capillary refill takes less than 2 seconds.  Neurological:     Mental Status: She is alert and oriented to person, place, and time.  Psychiatric:        Behavior: Behavior normal.     Musculoskeletal Exam: C-spine was in good range of motion.  She had bilateral trapezius spasm.  She had positive tender points.  Shoulder joints, elbow joints, wrist joints, MCPs PIPs and DIPs with good range of motion with no synovitis.  She had good range of motion of her hip joints which are replaced.  She had good range of motion of her knee joints without any warmth swelling or effusion.  There was no tenderness over ankles or MTPs.  CDAI Exam: CDAI Score: -- Patient Global: --; Provider Global: -- Swollen: --; Tender: -- Joint Exam 10/23/2021   No joint exam has been documented for this visit   There is currently no information documented on the homunculus. Go to the Rheumatology activity and complete the homunculus joint exam.  Investigation: No additional findings.  Imaging: No results found.  Recent Labs: Lab Results  Component Value Date   WBC 7.6 07/21/2014   HGB 13.9 07/21/2014   PLT 250.0 07/21/2014   NA 139 04/24/2017   K 4.2 04/24/2017   CL 99 04/24/2017   CO2 23 04/24/2017   GLUCOSE 108 (H) 04/24/2017   BUN 8 04/24/2017   CREATININE 1.02 (H) 10/19/2018   CALCIUM 9.5 04/24/2017   GFRAA >60 10/19/2018    Speciality Comments: No specialty comments  available.  Procedures:  No procedures performed Allergies: Amoxil [amoxicillin], Penicillins, Tape, Sulfa antibiotics, Zanaflex [tizanidine], Polytrim [polymyxin b-trimethoprim], and Voltaren [diclofenac sodium]   Assessment / Plan:     Visit Diagnoses: Fibromyalgia -she continues to have generalized pain and discomfort.  She had positive tender points and generalized hyperalgesia.  She takes Flexeril 5 mg 1/2 tablet at bedtime as needed.  Side effects of medication were reviewed.  Raynaud's disease without gangrene - amlodipine 2.5 mg 1 tablet by mouth daily and remains on aspirin 81 mg 1 tablet daily.  And keeping core temperature warm and warm clothing was discussed.  Sicca syndrome (HCC)-she has been using over-the-counter products which has been helpful.  Chronic pain of left knee-no warmth swelling or effusion was noted.  Status post bilateral total hip replacement-she had good range of motion in bilateral hip joints with some discomfort.  Chronic midline low back pain with left-sided sciatica-she complains of increased back pain.  A handout on back exercises was given.  She had no point tenderness on examination.  History of vitamin D deficiency-  History of hypertension-her systolic blood pressures are still elevated.  She has been followed by Dr. Caryl Comes.  History of migraine  Holmes-Adie syndrome, unspecified laterality  Orders: No orders of the defined types were placed in this encounter.  No orders of the defined types were placed in this encounter.    Follow-Up Instructions: Return in about 6 months (around 04/22/2022) for OA, FMS.   Bo Merino, MD  Note - This record has been created using Editor, commissioning.  Chart creation errors have been sought, but may not always  have been located. Such creation errors do not reflect on  the standard of medical care.

## 2021-10-23 ENCOUNTER — Other Ambulatory Visit: Payer: Self-pay

## 2021-10-23 ENCOUNTER — Encounter: Payer: Self-pay | Admitting: Rheumatology

## 2021-10-23 ENCOUNTER — Ambulatory Visit (INDEPENDENT_AMBULATORY_CARE_PROVIDER_SITE_OTHER): Payer: Medicare Other | Admitting: Rheumatology

## 2021-10-23 VITALS — BP 157/75 | HR 67 | Ht 62.5 in | Wt 146.2 lb

## 2021-10-23 DIAGNOSIS — Z96641 Presence of right artificial hip joint: Secondary | ICD-10-CM

## 2021-10-23 DIAGNOSIS — M797 Fibromyalgia: Secondary | ICD-10-CM | POA: Diagnosis not present

## 2021-10-23 DIAGNOSIS — Z8679 Personal history of other diseases of the circulatory system: Secondary | ICD-10-CM | POA: Diagnosis not present

## 2021-10-23 DIAGNOSIS — I7300 Raynaud's syndrome without gangrene: Secondary | ICD-10-CM | POA: Diagnosis not present

## 2021-10-23 DIAGNOSIS — Z8669 Personal history of other diseases of the nervous system and sense organs: Secondary | ICD-10-CM | POA: Diagnosis not present

## 2021-10-23 DIAGNOSIS — M5442 Lumbago with sciatica, left side: Secondary | ICD-10-CM | POA: Diagnosis not present

## 2021-10-23 DIAGNOSIS — G8929 Other chronic pain: Secondary | ICD-10-CM | POA: Diagnosis not present

## 2021-10-23 DIAGNOSIS — Z96643 Presence of artificial hip joint, bilateral: Secondary | ICD-10-CM | POA: Diagnosis not present

## 2021-10-23 DIAGNOSIS — Z96642 Presence of left artificial hip joint: Secondary | ICD-10-CM

## 2021-10-23 DIAGNOSIS — Z8639 Personal history of other endocrine, nutritional and metabolic disease: Secondary | ICD-10-CM | POA: Diagnosis not present

## 2021-10-23 DIAGNOSIS — M25562 Pain in left knee: Secondary | ICD-10-CM

## 2021-10-23 DIAGNOSIS — M3500 Sicca syndrome, unspecified: Secondary | ICD-10-CM

## 2021-10-23 DIAGNOSIS — H18529 Epithelial (juvenile) corneal dystrophy, unspecified eye: Secondary | ICD-10-CM

## 2021-10-23 DIAGNOSIS — H57059 Tonic pupil, unspecified eye: Secondary | ICD-10-CM | POA: Diagnosis not present

## 2021-10-23 NOTE — Patient Instructions (Signed)

## 2021-11-02 ENCOUNTER — Telehealth: Payer: Self-pay | Admitting: Internal Medicine

## 2021-11-02 ENCOUNTER — Other Ambulatory Visit: Payer: Self-pay

## 2021-11-02 MED ORDER — AMLODIPINE BESYLATE 2.5 MG PO TABS
2.5000 mg | ORAL_TABLET | Freq: Every day | ORAL | 1 refills | Status: DC
Start: 2021-11-02 — End: 2021-12-28

## 2021-11-02 MED ORDER — AMLODIPINE BESYLATE 2.5 MG PO TABS: 2.5000 mg | ORAL_TABLET | Freq: Every day | ORAL | 0 refills | Status: DC

## 2021-11-02 NOTE — Telephone Encounter (Signed)
Pt's medication was sent to pt's pharmacy as requested. Confirmation received.  °

## 2021-11-02 NOTE — Telephone Encounter (Signed)
New message     *STAT* If patient is at the pharmacy, call can be transferred to refill team.   1. Which medications need to be refilled? (please list name of each medication and dose if known)  amlodipine 2.5mg   2. Which pharmacy/location (including street and city if local pharmacy) is medication to be sent to?  [facility] on [address] in [place]  3. Do they need a 30 day or 90 day supply? Patient has 2 days left of medication.  She needs enough to last until her feb appt with Dr Graciela Husbands.

## 2021-12-18 ENCOUNTER — Other Ambulatory Visit: Payer: Self-pay

## 2021-12-18 ENCOUNTER — Ambulatory Visit (INDEPENDENT_AMBULATORY_CARE_PROVIDER_SITE_OTHER): Payer: Medicare Other | Admitting: Internal Medicine

## 2021-12-18 ENCOUNTER — Encounter: Payer: Self-pay | Admitting: Internal Medicine

## 2021-12-18 DIAGNOSIS — H57059 Tonic pupil, unspecified eye: Secondary | ICD-10-CM

## 2021-12-18 DIAGNOSIS — R0602 Shortness of breath: Secondary | ICD-10-CM

## 2021-12-18 DIAGNOSIS — I10 Essential (primary) hypertension: Secondary | ICD-10-CM

## 2021-12-18 DIAGNOSIS — K219 Gastro-esophageal reflux disease without esophagitis: Secondary | ICD-10-CM | POA: Diagnosis not present

## 2021-12-18 DIAGNOSIS — I7300 Raynaud's syndrome without gangrene: Secondary | ICD-10-CM | POA: Diagnosis not present

## 2021-12-18 DIAGNOSIS — J3089 Other allergic rhinitis: Secondary | ICD-10-CM | POA: Diagnosis not present

## 2021-12-18 DIAGNOSIS — M797 Fibromyalgia: Secondary | ICD-10-CM | POA: Diagnosis not present

## 2021-12-18 DIAGNOSIS — J069 Acute upper respiratory infection, unspecified: Secondary | ICD-10-CM

## 2021-12-18 DIAGNOSIS — M3500 Sicca syndrome, unspecified: Secondary | ICD-10-CM | POA: Diagnosis not present

## 2021-12-18 MED ORDER — PREDNISONE 20 MG PO TABS: 40.0000 mg | ORAL_TABLET | Freq: Every day | ORAL | 0 refills | Status: DC

## 2021-12-18 NOTE — Progress Notes (Signed)
° °  Subjective:   Patient ID: Emily Page, female    DOB: [birth year], 70 y.o.   MRN: [id number]  HPI The patient is a new 71 YO female coming in for URI symptoms and ongoing care.   PMH, [facility], social history reviewed and updated  Review of Systems  Constitutional: Negative.  Negative for fatigue, fever and unexpected weight change.  HENT:  Positive for congestion, postnasal drip, rhinorrhea and sinus pressure. Negative for ear discharge, ear pain, sinus pain, sneezing, sore throat, tinnitus, trouble swallowing and voice change.   Eyes: Negative.   Respiratory:  Positive for cough. Negative for chest tightness, shortness of breath and wheezing.   Cardiovascular: Negative.  Negative for chest pain, palpitations and leg swelling.  Gastrointestinal: Negative.  Negative for abdominal distention, abdominal pain, constipation, diarrhea, nausea and vomiting.  Musculoskeletal:  Positive for arthralgias.  Skin: Negative.   Neurological: Negative.   Psychiatric/Behavioral: Negative.     Objective:  Physical Exam Constitutional:      Appearance: She is well-developed.  HENT:     Head: Normocephalic and atraumatic.     Comments: Oropharynx with redness and clear drainage, nose with swollen turbinates, TMs normal bilaterally.  Neck:     Thyroid: No thyromegaly.  Cardiovascular:     Rate and Rhythm: Normal rate and regular rhythm.  Pulmonary:     Effort: Pulmonary effort is normal. No respiratory distress.     Breath sounds: Normal breath sounds. No wheezing or rales.  Abdominal:     General: Bowel sounds are normal. There is no distension.     Palpations: Abdomen is soft.     Tenderness: There is no abdominal tenderness. There is no rebound.  Musculoskeletal:        General: No tenderness.     Cervical back: Normal range of motion.  Lymphadenopathy:     Cervical: No cervical adenopathy.  Skin:    General: Skin is warm and dry.  Neurological:     Mental Status: She is alert and  oriented to person, place, and time.     Coordination: Coordination normal.    Vitals:   12/18/21 1409  BP: 122/88  Pulse: 71  Resp: 18  SpO2: 98%  Weight: 146 lb 12.8 oz (66.6 kg)  Height: 5' 2.5" (1.588 m)    This visit occurred during the SARS-CoV-2 public health emergency.  Safety protocols were in place, including screening questions prior to the visit, additional usage of staff PPE, and extensive cleaning of exam room while observing appropriate contact time as indicated for disinfecting solutions.   Assessment & Plan:

## 2021-12-18 NOTE — Patient Instructions (Addendum)
We will send in the prednisone today to take 2 pills a day for 5 days.

## 2021-12-20 DIAGNOSIS — J069 Acute upper respiratory infection, unspecified: Secondary | ICD-10-CM | POA: Insufficient documentation

## 2021-12-20 NOTE — Assessment & Plan Note (Signed)
Uses amlodipine 2.5 mg daily and worse in winter with cold weather. No gangrene.

## 2021-12-20 NOTE — Assessment & Plan Note (Signed)
Uses otc to help with this and adequate control.

## 2021-12-20 NOTE — Assessment & Plan Note (Signed)
With persistent symptoms for about 3 weeks now. Lungs are clear will treat with prednisone 40 mg daily for 5 days to help reduce inflammation and sinus drainage. No antibiotics are indicated.

## 2021-12-20 NOTE — Assessment & Plan Note (Signed)
Takes flonase and otc antihistamine as needed.

## 2021-12-20 NOTE — Assessment & Plan Note (Signed)
She has had lung and cardiology testing without clear etiology.

## 2021-12-20 NOTE — Assessment & Plan Note (Signed)
BP at goal on amlodipine 2.5 mg daily and hydralazine 25 mg BID and lisinopril 40 mg daily and metoprolol 100 mg daily currently cardiology is prescribing we can take over this if needed. Reviewed recent labs and no indication for change.

## 2021-12-20 NOTE — Assessment & Plan Note (Signed)
Affecting eyes and seeing eye specialist regularly.

## 2021-12-20 NOTE — Assessment & Plan Note (Signed)
Taking nexium 40 mg daily and symptoms adequately controlled.

## 2021-12-20 NOTE — Assessment & Plan Note (Signed)
Seeing rheumatology for management and using muscle relaxers effectively for pain currently.

## 2021-12-28 ENCOUNTER — Ambulatory Visit (INDEPENDENT_AMBULATORY_CARE_PROVIDER_SITE_OTHER): Payer: Medicare Other | Admitting: Internal Medicine

## 2021-12-28 ENCOUNTER — Other Ambulatory Visit: Payer: Self-pay

## 2021-12-28 ENCOUNTER — Encounter: Payer: Self-pay | Admitting: Internal Medicine

## 2021-12-28 VITALS — Ht 62.5 in | Wt 141.2 lb

## 2021-12-28 DIAGNOSIS — R002 Palpitations: Secondary | ICD-10-CM

## 2021-12-28 DIAGNOSIS — Z79899 Other long term (current) drug therapy: Secondary | ICD-10-CM | POA: Diagnosis not present

## 2021-12-28 DIAGNOSIS — I10 Essential (primary) hypertension: Secondary | ICD-10-CM | POA: Diagnosis not present

## 2021-12-28 DIAGNOSIS — G901 Familial dysautonomia [Riley-Day]: Secondary | ICD-10-CM

## 2021-12-28 DIAGNOSIS — I429 Cardiomyopathy, unspecified: Secondary | ICD-10-CM

## 2021-12-28 MED ORDER — AMLODIPINE BESYLATE 2.5 MG PO TABS: 2.5000 mg | ORAL_TABLET | Freq: Every day | ORAL | 3 refills | Status: DC

## 2021-12-28 NOTE — Progress Notes (Signed)
Patient Care Team: Myrlene Broker, MD as PCP - General (Internal Medicine) Juluis Rainier, MD (Inactive) as Referring Physician (Family Medicine) Duke Salvia, MD as Consulting Physician (Cardiology)   HPI  Emily Page is a 71 y.o. female seen in follow-up for stereotypical spells suggestive of dysautonomia.  Also with PVCs   Overall, she states she has good days and bad days. On a bad day, if she pushes herself too much she begins to feel weak, fatigued, faint, and lightheaded with an elevated heart rate (90s-100s). During an episode, when she sits down she immediately begins to feel better. Her symptoms return as soon as she stands up again. Previously she noted low blood pressures during these episodes, but this has not been monitored lately. Her episodes seem to occur more often while she is standing, but may occur while walking.  She continues to experience palpitations randomly. These are only mildly bothersome for her.  She develops peripheral edema in her ankles and hands if she does not sufficiently elevate her legs.   Previously her blood pressure increased to the 170s-180s when amlodipine was discontinued.   The patient denies chest pain, shortness of breath, nocturnal dyspnea, orthopnea.  There have been no syncopal episodes.   Recently she was ill due to an URI for about a month. Initially began with a cough, she did not test for COVID. Her husband tested negative for COVID. She is recovering at this time.  DATE TEST EF   8/15 Echo  45-50 %   9/15 Cath   Normal CA  11/16 cMRI 47 % No LGE  6/18 Echo 50-55 %   11/19 cMRI 59 %     Date Cr K Hgb  5/18 0.74 4.2 13.9 (8/15)  11/19 1.02              CPX 11/16 FVC 2.83 (100%)      FEV1 2.13 (94%)        FEV1/FVC 75 (96%)  Other studies personally reviewed: LH with recorded rhythms showing normal sinus during spells of LH and weakness  Records and Results Reviewed  Past Medical History:   Diagnosis Date   Allergic rhinitis    followed by Dr Willa Rough in the past   Fibromyalgia    followed by Dr Bedelia Person   Foot pain, right    w intermittent swelling felt to be related to how she is walking on her foot. 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 Did have occassional PVC's   Hypertension    Migraines    followed [facility] past.    Ocular rosacea    per patient   OSA (obstructive sleep apnea) 10/28/2016   Very mild   PVC (premature ventricular contraction)    Raynaud's disease    followed by Dr Corliss Skains and norvasc has helped   Rosacea    and seborrheic dermatitis followed by Dr Levy Sjogren    Past Surgical History:  Procedure Laterality Date   CARDIAC CATHETERIZATION     EYE SURGERY Left 10/2017   cataract extraction  HIP ARTHROPLASTY     HIP SURGERY     LEFT HEART CATHETERIZATION WITH CORONARY ANGIOGRAM N/A 07/28/2014   Procedure: LEFT HEART CATHETERIZATION WITH CORONARY ANGIOGRAM;  Surgeon: Marykay Lex, MD;  Location: [facility] CATH LAB;  Service: Cardiovascular;  Laterality: N/A;   LEG SURGERY     SPLIT NIGHT STUDY  04/10/2016   TOTAL HIP ARTHROPLASTY Right    TOTAL HIP ARTHROPLASTY Left 05/2020   Dr. Lequita Halt   VAGINAL HYSTERECTOMY      Current Meds  Medication Sig   amLODipine (NORVASC) 2.5 MG tablet Take 1 tablet (2.5 mg total) by mouth daily. Please keep upcoming appt with Dr. Graciela Husbands in February 2023 before anymore refills. Thank you Final attempt   aspirin EC 81 MG tablet Take 81 mg by mouth every evening.   CALCIUM PO Take 600 mg by mouth daily.     Carboxymethylcellulose Sodium (ARTIFICIAL TEARS OP) Apply to eye 2 (two) times daily.   Cholecalciferol (VITAMIN D) 125 MCG (5000 UT) CAPS Take 1 capsule by mouth daily.   clindamycin (CLEOCIN) 150 MG capsule Take 150 mg by mouth 4 (four) times daily. As needed for dental procedures.   cyclobenzaprine (FLEXERIL) 5 MG tablet TAKE 1/2 TABLET (2.5 MG TOTAL) BY MOUTH AT BEDTIME.   DENTA 5000 PLUS 1.1 % CREA dental cream See admin instructions.   esomeprazole (NEXIUM) 40 MG capsule Take 40 mg by mouth daily at 12 noon.   estradiol (ESTRACE) 2 MG tablet Take 2 mg by mouth daily.   fluconazole (DIFLUCAN) 150 MG tablet Take 150 mg by mouth daily as needed (for yeast infection).    fluticasone (FLONASE) 50 MCG/ACT nasal spray    ketoconazole (NIZORAL) 2 % cream Apply 1 application topically daily.   ketoconazole (NIZORAL) 2 % shampoo Apply 1 application topically daily.    lisinopril (ZESTRIL) 40 MG tablet TAKE 1 TABLET BY MOUTH EVERY DAY   Magnesium Malate 1250 (141.7 Mg) MG TABS Take 2 tablets by mouth daily.   metoprolol succinate (TOPROL-XL) 100 MG 24 hr tablet TAKE 1 TABLET (100 MG TOTAL) BY MOUTH DAILY. PLEASE KEEP UPCOMING APPT FOR FUTURE REFILLS.   metroNIDAZOLE (METROCREAM) 0.75 % cream Apply 1 application topically 2 (two) times daily.   nystatin-triamcinolone ointment (MYCOLOG) Apply 1 application topically daily as needed (to rash).    Probiotic Product (PROBIOTIC PO) Take by mouth daily.   [DISCONTINUED] hydrALAZINE (APRESOLINE) 25 MG tablet TAKE 1 TABLET (25 MG TOTAL) BY MOUTH 2 (TWO) TIMES DAILY. PLEASE KEEP UPCOMING APPT FOR FUTURE REFILLS.    Allergies  Allergen Reactions   Amoxil [Amoxicillin]     Felt like throat was swelling   Penicillins     Swelling of throat   Tape     Electrodes cause Blisters, even with the sensitive kind    Sulfa Antibiotics     Swelling of eyes   Zanaflex [Tizanidine]     halluciations   Polytrim [Polymyxin B-Trimethoprim]     unknown   Voltaren  [Diclofenac Sodium] Rash     Review of Systems negative except from HPI and PMH  Physical Exam Ht 5' 2.5" (1.588 m)    Wt 141 lb 3.2 oz (64 kg)    SpO2 98%    BMI 25.41 kg/m  Well developed and nourished in no acute distress HENT normal  ptosis L  Neck supple with JVP- Clear Regular rate and rhythm, no murmurs or gallops Abd-soft with active BS No Clubbing cyanosis edema Skin-warm and dry A &  Oriented  Grossly normal sensory and motor function  ECG sinus 59 14/08/42 TWI V2 V5 No change 6/22  CrCl cannot be calculated (Patient's most recent lab result is older than the maximum 21 days allowed.).   Assessment and  Plan  Orthostatic intolerance  Dyspnea  Palpitations  Cardiomyopathy mild  Hypertension Continues with spells suggestive of orthostatic intolerance.  In the past, they have been unassociated with objective evidence of blood pressure or heart rate.  We will continue to try to explore that.  With the relief by sitting, we have discussed the potential benefits of compressive wear including abdominal binding and thigh sleeves.  With probable peripheral edema, we previously discontinue the amlodipine with a surgeon her blood pressure as noted above; 2.5 is probably not sufficient to be the cause of significant edema.  We will continue.  With her blood pressure now 110 we will continue her on the losartan and the metoprolol; we will discontinue the hydralazine.  Need to check a metabolic profile.          Current medicines are reviewed at length with the patient today .  The patient does not  have concerns regarding medicines.   I,Mathew Stumpf,acting as a scribe for Sherryl Manges, MD.,have documented all relevant documentation on the behalf of Sherryl Manges, MD,as directed by  Sherryl Manges, MD while in the presence of Sherryl Manges, MD.  .I, Sherryl Manges, MD, have reviewed all documentation for this visit. The documentation on 12/28/21 for the exam, diagnosis,  procedures, and orders are all accurate and complete.

## 2021-12-28 NOTE — Patient Instructions (Addendum)
Medication Instructions:  Your physician has recommended you make the following change in your medication:   ** Stop Hydralazine  *If you need a refill on your cardiac medications before your next appointment, please call your pharmacy*   Lab Work: BMET  and CBC today  If you have labs (blood work) drawn today and your tests are completely normal, you will receive your results only by: [facility] (if you have MyChart) OR A paper copy in the mail If you have any lab test that is abnormal or we need to change your treatment, we will call you to review the results.   Testing/Procedures: None ordered.    Follow-Up: At [facility], you and your health needs are our priority.  As part of our continuing mission to provide you with exceptional heart care, we have created designated Provider Care Teams.  These Care Teams include your primary Cardiologist (physician) and Advanced Practice Providers (APPs -  Physician Assistants and Nurse Practitioners) who all work together to provide you with the care you need, when you need it.  We recommend signing up for the patient portal called "MyChart".  Sign up information is provided on this After Visit Summary.  MyChart is used to connect with patients for Virtual Visits (Telemedicine).  Patients are able to view lab/test results, encounter notes, upcoming appointments, etc.  Non-urgent messages can be sent to your provider as well.   To learn more about what you can do with MyChart, go to [email or web address].    Your next appointment:   6 months with Dr Caryl Comes

## 2021-12-29 LAB — CBC
Hematocrit: 38.5 % (ref 34.0–46.6)
Hemoglobin: 13.1 g/dL (ref 11.1–15.9)
MCH: 29.9 pg (ref 26.6–33.0)
MCHC: 34 g/dL (ref 31.5–35.7)
MCV: 88 fL (ref 79–97)
Platelets: 259 10*3/uL (ref 150–450)
RBC: 4.38 x10E6/uL (ref 3.77–5.28)
RDW: 12 % (ref 11.7–15.4)
WBC: 9 10*3/uL (ref 3.4–10.8)

## 2021-12-29 LAB — BASIC METABOLIC PANEL
BUN/Creatinine Ratio: 14 (ref 12–28)
BUN: 13 mg/dL (ref 8–27)
CO2: 27 mmol/L (ref 20–29)
Calcium: 9.3 mg/dL (ref 8.7–10.3)
Chloride: 101 mmol/L (ref 96–106)
Creatinine, Ser: 0.93 mg/dL (ref 0.57–1.00)
Glucose: 154 mg/dL — ABNORMAL HIGH (ref 70–99)
Potassium: 4.4 mmol/L (ref 3.5–5.2)
Sodium: 140 mmol/L (ref 134–144)
eGFR: 66 mL/min/{1.73_m2} (ref >59)

## 2022-01-11 ENCOUNTER — Other Ambulatory Visit: Payer: Self-pay | Admitting: Rheumatology

## 2022-01-14 NOTE — Telephone Encounter (Signed)
Next Visit: 04/23/2022  Last Visit: 10/23/2021  Last Fill: 08/20/2021  Dx: Fibromyalgia  Current Dose per office note on 10/23/2021:  Flexeril 5 mg 1/2 tablet at bedtime as needed.  Okay to refill Flexeril?

## 2022-02-06 DIAGNOSIS — L218 Other seborrheic dermatitis: Secondary | ICD-10-CM | POA: Diagnosis not present

## 2022-02-06 DIAGNOSIS — L718 Other rosacea: Secondary | ICD-10-CM | POA: Diagnosis not present

## 2022-04-09 NOTE — Progress Notes (Deleted)
Office Visit Note  Patient: Emily Page             Date of Birth: 1951-05-03           MRN: [id number]             PCP: Myrlene Broker, MD Referring: Duke Salvia, MD Visit Date: 04/23/2022 Occupation: @GUAROCC @  Subjective:  No chief complaint on file.   History of Present Illness: Emily Page is a 71 y.o. female ***   Activities of Daily Living:  Patient reports morning stiffness for *** {minute/hour:19697}.   Patient {ACTIONS;DENIES/REPORTS:21021675::"Denies"} nocturnal pain.  Difficulty dressing/grooming: {ACTIONS;DENIES/REPORTS:21021675::"Denies"} Difficulty climbing stairs: {ACTIONS;DENIES/REPORTS:21021675::"Denies"} Difficulty getting out of chair: {ACTIONS;DENIES/REPORTS:21021675::"Denies"} Difficulty using hands for taps, buttons, cutlery, and/or writing: {ACTIONS;DENIES/REPORTS:21021675::"Denies"}  No Rheumatology ROS completed.   PMFS History:  Patient Active Problem List   Diagnosis Date Noted  . URI (upper respiratory infection) 12/20/2021  . Dysautonomia (HCC) 12/08/2019  . Cardiomyopathy (HCC) 12/08/2019  . Tachycardia 12/08/2019  . Fibromyalgia 03/06/2017  . Sicca (HCC) 03/06/2017  . Raynaud's disease without gangrene 03/06/2017  . Vitamin D deficiency 03/06/2017  . Vasovagal syncope 03/06/2017  . 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 OSA (obstructive sleep apnea) 10/28/2016  . Excessive daytime sleepiness 04/22/2016  . Exertional shortness of breath 07/14/2014  . Palpitations 07/14/2014  . Allergic rhinitis 04/14/2014  . Cervical pain 04/14/2014  . Acid reflux 04/14/2014  . Atypical migraine 04/14/2014  . Arthralgia of multiple joints 04/14/2014  . Hypertension   . Chest pain, atypical 03/31/2014  . PVC (premature ventricular contraction) 03/31/2014  . Blood glucose elevated 02/02/2014    Past Medical History:  Diagnosis Date  . Allergic rhinitis    followed by Dr 04/04/2014 in the past  . Fibromyalgia     followed by Dr Willa Rough  . Foot pain, right    w intermittent swelling felt to be related to how she is walking on her foot. evaluated by Dr Bedelia Person (podiatrist)  . GERD (gastroesophageal reflux disease)    Followed by Dr Leticia Penna in the past  . 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 History of EKG    w palpitations and nonspecific T-wave changes, stress cardiolite 6/08 showed normal LV size in a systolic function,no maximum effort stress test with moderate work load and good HR achieved,no EKG criteria of ischemia. Did have occassional PVC's  . Hypertension   . Migraines    followed by [facility] in past.   . Ocular rosacea    per patient  . OSA (obstructive sleep apnea) 10/28/2016   Very mild  . PVC (premature ventricular contraction)   . Raynaud's disease    followed by Dr 14/02/2016 and norvasc has helped  . Rosacea    and seborrheic dermatitis followed by Dr Corliss Skains    Family History  Problem Relation Age of Onset  . Hypertension Mother   . Heart attack Father   . Hypertension Father   . Hyperlipidemia Father    Past Surgical History:  Procedure Laterality Date  . CARDIAC CATHETERIZATION    . EYE SURGERY Left 10/2017   cataract extraction   . HIP ARTHROPLASTY    . HIP SURGERY    . LEFT HEART CATHETERIZATION WITH CORONARY ANGIOGRAM N/A 07/28/2014   Procedure: LEFT HEART CATHETERIZATION WITH CORONARY ANGIOGRAM;  Surgeon: 09/27/2014, MD;  Location: [facility] CATH LAB;  Service: Cardiovascular;  Laterality: N/A;  . LEG SURGERY    .  SPLIT NIGHT STUDY  04/10/2016  . TOTAL HIP ARTHROPLASTY Right   . TOTAL HIP ARTHROPLASTY Left 05/2020   Dr. Lequita Halt  . VAGINAL HYSTERECTOMY     Social History   Social History Narrative  . Not on file   Immunization History  Administered Date(s) Administered  . Influenza,inj,Quad PF,6+ Mos 08/24/2015  . PFIZER(Purple Top)SARS-COV-2 Vaccination 01/03/2020, 01/28/2020     Objective: Vital Signs:  There were no vitals taken for this visit.   Physical Exam   Musculoskeletal Exam: ***  CDAI Exam: CDAI Score: -- Patient Global: --; Provider Global: -- Swollen: --; Tender: -- Joint Exam 04/23/2022   No joint exam has been documented for this visit   There is currently no information documented on the homunculus. Go to the Rheumatology activity and complete the homunculus joint exam.  Investigation: No additional findings.  Imaging: No results found.  Recent Labs: Lab Results  Component Value Date   WBC 9.0 12/28/2021   HGB 13.1 12/28/2021   PLT 259 12/28/2021   NA 140 12/28/2021   K 4.4 12/28/2021   CL 101 12/28/2021   CO2 27 12/28/2021   GLUCOSE 154 (H) 12/28/2021   BUN 13 12/28/2021   CREATININE 0.93 12/28/2021   CALCIUM 9.3 12/28/2021   GFRAA >60 10/19/2018    Speciality Comments: No specialty comments available.  Procedures:  No procedures performed Allergies: Amoxil [amoxicillin], Penicillins, Tape, Sulfa antibiotics, Zanaflex [tizanidine], Polytrim [polymyxin b-trimethoprim], and Voltaren [diclofenac sodium]   Assessment / Plan:     Visit Diagnoses: No diagnosis found.  Orders: No orders of the defined types were placed in this encounter.  No orders of the defined types were placed in this encounter.   Face-to-face time spent with patient was *** minutes. Greater than 50% of time was spent in counseling and coordination of care.  Follow-Up Instructions: No follow-ups on file.   Ellen Henri, CMA  Note - This record has been created using Animal nutritionist.  Chart creation errors have been sought, but may not always  have been located. Such creation errors do not reflect on  the standard of medical care.

## 2022-04-11 DIAGNOSIS — H18593 Other hereditary corneal dystrophies, bilateral: Secondary | ICD-10-CM | POA: Diagnosis not present

## 2022-04-11 DIAGNOSIS — H0102A Squamous blepharitis right eye, upper and lower eyelids: Secondary | ICD-10-CM | POA: Diagnosis not present

## 2022-04-11 DIAGNOSIS — H0102B Squamous blepharitis left eye, upper and lower eyelids: Secondary | ICD-10-CM | POA: Diagnosis not present

## 2022-04-23 ENCOUNTER — Ambulatory Visit: Payer: Medicare Other | Admitting: Physician Assistant

## 2022-04-23 DIAGNOSIS — M3500 Sicca syndrome, unspecified: Secondary | ICD-10-CM

## 2022-04-23 DIAGNOSIS — G8929 Other chronic pain: Secondary | ICD-10-CM

## 2022-04-23 DIAGNOSIS — H57059 Tonic pupil, unspecified eye: Secondary | ICD-10-CM

## 2022-04-23 DIAGNOSIS — Z8679 Personal history of other diseases of the circulatory system: Secondary | ICD-10-CM

## 2022-04-23 DIAGNOSIS — I7300 Raynaud's syndrome without gangrene: Secondary | ICD-10-CM

## 2022-04-23 DIAGNOSIS — M797 Fibromyalgia: Secondary | ICD-10-CM

## 2022-04-23 DIAGNOSIS — Z8669 Personal history of other diseases of the nervous system and sense organs: Secondary | ICD-10-CM

## 2022-04-23 DIAGNOSIS — Z96643 Presence of artificial hip joint, bilateral: Secondary | ICD-10-CM

## 2022-04-23 DIAGNOSIS — Z8639 Personal history of other endocrine, nutritional and metabolic disease: Secondary | ICD-10-CM

## 2022-04-30 NOTE — Progress Notes (Signed)
Office Visit Note  Patient: Emily Page             Date of Birth: 12/21/1950           MRN: [id number]             PCP: Myrlene Broker, MD Referring: Duke Salvia, MD Visit Date: 05/13/2022 Occupation: @GUAROCC @  Subjective:  Generalized myalgias   History of Present Illness: Emily Page is a 71 y.o. female with history of fibromyalgia and raynaud's.  Patient continues to experience intermittent myalgias and muscle tenderness due to fibromyalgia.  She has been taking Flexeril 5 mg half tablet at bedtime as needed for muscle spasms which alleviates her symptoms.  She is also been using a heating pad as needed.  She has been using a recumbent bike on a daily basis for exercise.  She typically will bike for about 15 minutes 2-3 times per day.  Overall her pain with fibromyalgia has been tolerable.  She experiences intermittent arthralgias and joint stiffness but denies any joint swelling.  She has intermittent symptoms of Raynaud's but overall these episodes have been infrequent.  She denies any digital ulcerations or signs of gangrene.   Activities of Daily Living:  Patient reports morning stiffness for 2 hours.   Patient Reports nocturnal pain.  Difficulty dressing/grooming: Denies Difficulty climbing stairs: Denies Difficulty getting out of chair: Denies Difficulty using hands for taps, buttons, cutlery, and/or writing: Denies  Review of Systems  Constitutional:  Positive for fatigue.  HENT:  Positive for mouth dryness. Negative for mouth sores and nose dryness.   Eyes:  Positive for dryness. Negative for pain and visual disturbance.  Respiratory:  Negative for cough, hemoptysis and difficulty breathing.   Cardiovascular:  Positive for swelling in legs/feet. Negative for chest pain, palpitations and hypertension.  Gastrointestinal:  Negative for blood in stool, constipation and diarrhea.  Endocrine: Negative for increased urination.  Genitourinary:  Negative  for difficulty urinating and painful urination.  Musculoskeletal:  Positive for joint pain, joint pain, joint swelling, muscle weakness, morning stiffness and muscle tenderness. Negative for myalgias and myalgias.  Skin:  Negative for color change, pallor, rash, hair loss, nodules/bumps, skin tightness, ulcers and sensitivity to sunlight.  Neurological:  Positive for numbness and weakness. Negative for dizziness and headaches.  Hematological:  Positive for bruising/bleeding tendency. Negative for swollen glands.  Psychiatric/Behavioral:  Positive for sleep disturbance. Negative for depressed mood. The patient is not nervous/anxious.     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   OSA (obstructive sleep apnea) 10/28/2016   Excessive daytime sleepiness 04/22/2016   Exertional shortness of breath 07/14/2014   Palpitations 07/14/2014   Allergic rhinitis 04/14/2014   Cervical pain 04/14/2014   Acid reflux 04/14/2014   Atypical migraine 04/14/2014   Arthralgia of multiple joints 04/14/2014   Hypertension    Chest pain, atypical 03/31/2014   PVC (premature ventricular contraction) 03/31/2014   Blood glucose elevated 02/02/2014    Past Medical History:  Diagnosis Date   Allergic rhinitis    followed by Dr Willa Rough in the past   Fibromyalgia    followed by  Dr Bedelia Person   Foot pain, right    w intermittent swelling felt to be related to how she is walking on her foot. 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 Did have occassional PVC's   Hypertension    Migraines    followed [facility] in past.    Ocular rosacea    per patient   OSA (obstructive sleep apnea) 10/28/2016   Very mild   PVC (premature ventricular contraction)    Raynaud's disease    followed by Dr Corliss Skains and norvasc has helped   Rosacea    and seborrheic dermatitis followed by Dr Levy Sjogren    Family History  Problem Relation Age of Onset   Hypertension Mother    Heart attack Father    Hypertension Father    Hyperlipidemia Father    Past Surgical History:  Procedure Laterality Date   CARDIAC CATHETERIZATION     EYE SURGERY Left 10/2017   cataract extraction    HIP ARTHROPLASTY     HIP SURGERY     LEFT HEART CATHETERIZATION WITH CORONARY ANGIOGRAM N/A 07/28/2014   Procedure: LEFT HEART CATHETERIZATION WITH CORONARY ANGIOGRAM;  Surgeon: Marykay Lex, MD;  Location: [facility] C[facility] LAB;  Service: Cardiovascular;  Laterality: N/A;   LEG SURGERY     SPLIT NIGHT STUDY  04/10/2016   TOTAL HIP ARTHROPLASTY Right    TOTAL HIP ARTHROPLASTY Left 05/2020   Dr. Lequita Halt   VAGINAL HYSTERECTOMY     Social History   Social History Narrative   Not on file   Immunization History  Administered Date(s) Administered   Influenza,inj,Quad PF,6+ Mos 08/24/2015   PFIZER(Purple Top)SARS-COV-2 Vaccination 01/03/2020, 01/28/2020     Objective: Vital Signs: BP (!) 169/76 (BP Location: Left Arm, Patient Position: Sitting, Cuff Size: Normal)   Pulse 69   Resp 15   Ht 5' 2.75" (1.594 m)   Wt 144 lb (65.3 kg)   BMI 25.71 kg/m    Physical Exam Vitals and nursing note reviewed.  Constitutional:      Appearance: She is well-developed.  HENT:      Head: Normocephalic and atraumatic.  Eyes:     Conjunctiva/sclera: Conjunctivae normal.  Cardiovascular:     Rate and Rhythm: Normal rate and regular rhythm.     Heart sounds: Normal heart sounds.  Pulmonary:     Effort: Pulmonary effort is normal.     Breath sounds: Normal breath sounds.  Abdominal:     General: Bowel sounds are normal.     Palpations: Abdomen is soft.  Musculoskeletal:     Cervical back: Normal range of motion.  Skin:    General: Skin is warm and dry.     Capillary Refill: Capillary refill takes less than 2 seconds.  Neurological:     Mental Status: She is alert and oriented to person, place, and time.  Psychiatric:        Behavior: Behavior normal.      Musculoskeletal Exam: C-spine, thoracic spine, and lumbar spine good ROM.  Shoulder joints, elbow joints, wrist joints, MCPs, PIPs, and DIPs good ROM  with no synovitis.  Complete fist formation bilaterally. Bilateral hip replacements have good ROM with no groin pain.  Tenderness over bilateral trochanteric bursa.  Knee joints have good ROM with no warmth or effusion.  Ankle joints have good ROM with no tenderness or joint swelling.   CDAI Exam: CDAI Score: -- Patient Global: --; Provider Global: -- Swollen: --; Tender: -- Joint Exam 05/13/2022   No joint exam has been documented for this visit   There is currently no information documented on the homunculus. Go to the Rheumatology activity and complete the homunculus joint exam.  Investigation: No additional findings.  Imaging: No results found.  Recent Labs: Lab Results  Component Value Date   WBC 9.0 12/28/2021   HGB 13.1 12/28/2021   PLT 259 12/28/2021   NA 140 12/28/2021   K 4.4 12/28/2021   CL 101 12/28/2021   CO2 27 12/28/2021   GLUCOSE 154 (H) 12/28/2021   BUN 13 12/28/2021   CREATININE 0.93 12/28/2021   CALCIUM 9.3 12/28/2021   GFRAA >60 10/19/2018    Speciality Comments: No specialty comments available.  Procedures:  No  procedures performed Allergies: Amoxil [amoxicillin], Penicillins, Tape, Sulfa antibiotics, Zanaflex [tizanidine], Polytrim [polymyxin b-trimethoprim], and Voltaren [diclofenac sodium]   Assessment / Plan:     Visit Diagnoses: Fibromyalgia: She experiences intermittent myalgias and muscle tenderness due to fibromyalgia.  Overall her symptoms have been tolerable.  She takes Flexeril 5 mg half tablet by mouth at bedtime as needed for muscle spasms.  She has been using a heating pad to alleviate some of her myalgias and muscle tenderness as well.  Discussed the importance of regular exercise and good sleep hygiene.  She has been using a recumbent bike 15 minutes 2-3 times per day for exercise.  She will follow-up in the office in 6 months or sooner if needed.  Raynaud's disease without gangrene - She experiences intermittent symptoms of Raynaud's.  These episodes have been infrequent.  She has no digital ulcerations or signs of sclerodactyly on examination today.  She continues to take amlodipine 2.5 mg 1 tablet by mouth daily and remains on aspirin 81 mg 1 tablet daily.  Discussed the importance of avoiding triggers.  She was advised to notify us if she develops any new or worsening symptoms.  Sicca syndrome [facility]): She continues to have chronic sicca symptoms.  She uses over-the-counter products for symptomatic relief.  Chronic pain of left knee: She has good range of motion of the left knee joint on examination today.  Crepitus was noted.  No warmth or effusion noted.  She has been using a recumbent bike for exercise on a daily basis.  Status post bilateral total hip replacement: Doing well.  She has good range of motion of both hip replacements with no groin pain.  History of vitamin D deficiency: She is taking vitamin D 5000 units daily. DEXA ordered by her gynecologist.  Other medical conditions are listed as follows:   History of hypertension  History of migraine  Holmes-Adie syndrome,  unspecified laterality  Orders: No orders of the defined types were placed in this encounter.  No orders of the defined types were placed in this encounter.    Follow-Up Instructions: Return in about 6 months (around 11/12/2022) for Fibromyalgia.   Gearldine Bienenstock, PA-C  Note - This record has been created using Dragon software.  Chart creation errors have been sought, but may not always  have been located. Such creation errors do not reflect on  the standard of medical care.

## 2022-05-03 ENCOUNTER — Other Ambulatory Visit: Payer: Self-pay | Admitting: Physician Assistant

## 2022-05-03 NOTE — Telephone Encounter (Signed)
Next Visit: 05/13/2022  Last Visit: 10/23/2021  Last Fill: 01/14/2022  Dx:  Fibromyalgia   Current Dose per office note on 10/23/2021: Flexeril 5 mg 1/2 tablet at bedtime as needed.  Okay to refill Flexeril?

## 2022-05-06 DIAGNOSIS — Z23 Encounter for immunization: Secondary | ICD-10-CM | POA: Diagnosis not present

## 2022-05-13 ENCOUNTER — Encounter: Payer: Self-pay | Admitting: Physician Assistant

## 2022-05-13 ENCOUNTER — Ambulatory Visit (INDEPENDENT_AMBULATORY_CARE_PROVIDER_SITE_OTHER): Payer: Medicare Other | Admitting: Physician Assistant

## 2022-05-13 VITALS — BP 169/76 | HR 69 | Resp 15 | Ht 62.75 in | Wt 144.0 lb

## 2022-05-13 DIAGNOSIS — Z8679 Personal history of other diseases of the circulatory system: Secondary | ICD-10-CM

## 2022-05-13 DIAGNOSIS — G8929 Other chronic pain: Secondary | ICD-10-CM | POA: Diagnosis not present

## 2022-05-13 DIAGNOSIS — M25562 Pain in left knee: Secondary | ICD-10-CM

## 2022-05-13 DIAGNOSIS — H57059 Tonic pupil, unspecified eye: Secondary | ICD-10-CM

## 2022-05-13 DIAGNOSIS — Z96643 Presence of artificial hip joint, bilateral: Secondary | ICD-10-CM | POA: Diagnosis not present

## 2022-05-13 DIAGNOSIS — Z8639 Personal history of other endocrine, nutritional and metabolic disease: Secondary | ICD-10-CM

## 2022-05-13 DIAGNOSIS — M3500 Sicca syndrome, unspecified: Secondary | ICD-10-CM | POA: Diagnosis not present

## 2022-05-13 DIAGNOSIS — M797 Fibromyalgia: Secondary | ICD-10-CM

## 2022-05-13 DIAGNOSIS — Z8669 Personal history of other diseases of the nervous system and sense organs: Secondary | ICD-10-CM | POA: Diagnosis not present

## 2022-05-13 DIAGNOSIS — I7300 Raynaud's syndrome without gangrene: Secondary | ICD-10-CM

## 2022-06-02 ENCOUNTER — Other Ambulatory Visit: Payer: Self-pay | Admitting: Internal Medicine

## 2022-06-17 ENCOUNTER — Ambulatory Visit: Payer: Medicare Other | Admitting: Internal Medicine

## 2022-06-18 ENCOUNTER — Ambulatory Visit (INDEPENDENT_AMBULATORY_CARE_PROVIDER_SITE_OTHER): Payer: Medicare Other | Admitting: Internal Medicine

## 2022-06-18 ENCOUNTER — Encounter: Payer: Self-pay | Admitting: Internal Medicine

## 2022-06-18 VITALS — BP 118/82 | HR 86 | Resp 18 | Ht 62.75 in | Wt 145.4 lb

## 2022-06-18 DIAGNOSIS — I10 Essential (primary) hypertension: Secondary | ICD-10-CM | POA: Diagnosis not present

## 2022-06-18 DIAGNOSIS — E559 Vitamin D deficiency, unspecified: Secondary | ICD-10-CM | POA: Diagnosis not present

## 2022-06-18 DIAGNOSIS — G901 Familial dysautonomia [Riley-Day]: Secondary | ICD-10-CM | POA: Diagnosis not present

## 2022-06-18 DIAGNOSIS — R002 Palpitations: Secondary | ICD-10-CM

## 2022-06-18 DIAGNOSIS — R739 Hyperglycemia, unspecified: Secondary | ICD-10-CM

## 2022-06-18 DIAGNOSIS — N951 Menopausal and female climacteric states: Secondary | ICD-10-CM | POA: Diagnosis not present

## 2022-06-18 LAB — HEMOGLOBIN A1C: Hgb A1c MFr Bld: 5.6 % (ref 4.6–6.5)

## 2022-06-18 LAB — COMPREHENSIVE METABOLIC PANEL
ALT: 12 U/L (ref 0–35)
AST: 21 U/L (ref 0–37)
Albumin: 4.1 g/dL (ref 3.5–5.2)
Alkaline Phosphatase: 64 U/L (ref 39–117)
BUN: 13 mg/dL (ref 6–23)
CO2: 27 mEq/L (ref 19–32)
Calcium: 9.3 mg/dL (ref 8.4–10.5)
Chloride: 99 mEq/L (ref 96–112)
Creatinine, Ser: 0.9 mg/dL (ref 0.40–1.20)
GFR: 64.26 mL/min (ref >60.00)
Glucose, Bld: 109 mg/dL — ABNORMAL HIGH (ref 70–99)
Potassium: 4.6 mEq/L (ref 3.5–5.1)
Sodium: 136 mEq/L (ref 135–145)
Total Bilirubin: 0.5 mg/dL (ref 0.2–1.2)
Total Protein: 6.9 g/dL (ref 6.0–8.3)

## 2022-06-18 LAB — CBC
HCT: 39 % (ref 36.0–46.0)
Hemoglobin: 12.8 g/dL (ref 12.0–15.0)
MCHC: 32.9 g/dL (ref 30.0–36.0)
MCV: 90.5 fl (ref 78.0–100.0)
Platelets: 270 10*3/uL (ref 150.0–400.0)
RBC: 4.31 Mil/uL (ref 3.87–5.11)
RDW: 14.6 % (ref 11.5–15.5)
WBC: 8.6 10*3/uL (ref 4.0–10.5)

## 2022-06-18 LAB — T4, FREE: Free T4: 0.81 ng/dL (ref 0.60–1.60)

## 2022-06-18 LAB — LIPID PANEL
Cholesterol: 206 mg/dL — ABNORMAL HIGH (ref 0–200)
HDL: 94.4 mg/dL (ref >39.00)
LDL Cholesterol: 76 mg/dL (ref 0–99)
NonHDL: 111.18
Total CHOL/HDL Ratio: 2
Triglycerides: 176 mg/dL — ABNORMAL HIGH (ref 0.0–149.0)
VLDL: 35.2 mg/dL (ref 0.0–40.0)

## 2022-06-18 LAB — VITAMIN D 25 HYDROXY (VIT D DEFICIENCY, FRACTURES): VITD: 119.89 ng/mL (ref 30.00–100.00)

## 2022-06-18 LAB — VITAMIN B12: Vitamin B-12: 177 pg/mL — ABNORMAL LOW (ref 211–911)

## 2022-06-18 LAB — TSH: TSH: 2.26 u[IU]/mL (ref 0.35–5.50)

## 2022-06-18 MED ORDER — ESTRADIOL 2 MG PO TABS: 2.0000 mg | ORAL_TABLET | Freq: Every day | ORAL | 3 refills | Status: DC

## 2022-06-18 NOTE — Assessment & Plan Note (Signed)
Is taking estrogen 2 mg daily and will refill. She is s/p hysterectomy and does not need progesterone supplement.

## 2022-06-18 NOTE — Assessment & Plan Note (Signed)
Checking vitamin D and adjust as needed. Taking oral otc currently.

## 2022-06-18 NOTE — Assessment & Plan Note (Addendum)
She is having more symptoms with the heat. We discussed drinking plenty of fluids and some salt intake to help. Checking vitamin D, B12, TSH and HgA1c to rule out other causes of per-syncope and fatigue symptoms

## 2022-06-18 NOTE — Assessment & Plan Note (Signed)
Checking Hga1c and adjust as needed.  

## 2022-06-18 NOTE — Assessment & Plan Note (Signed)
BP at goal and checking CBC and CMP and lipid panel.

## 2022-06-18 NOTE — Progress Notes (Signed)
   Subjective:   Patient ID: Emily Page, female    DOB: [birth year], 71 y.o.   MRN: [id number]  HPI The patient is a 71 YO female coming in for follow up.  Review of Systems  Constitutional:  Positive for fatigue.  HENT: Negative.    Eyes: Negative.   Respiratory:  Negative for cough, chest tightness and shortness of breath.   Cardiovascular:  Negative for chest pain, palpitations and leg swelling.  Gastrointestinal:  Negative for abdominal distention, abdominal pain, constipation, diarrhea, nausea and vomiting.  Musculoskeletal:  Positive for myalgias.  Skin: Negative.   Neurological: Negative.   Psychiatric/Behavioral: Negative.      Objective:  Physical Exam Constitutional:      Appearance: She is well-developed.  HENT:     Head: Normocephalic and atraumatic.  Cardiovascular:     Rate and Rhythm: Normal rate and regular rhythm.  Pulmonary:     Effort: Pulmonary effort is normal. No respiratory distress.     Breath sounds: Normal breath sounds. No wheezing or rales.  Abdominal:     General: Bowel sounds are normal. There is no distension.     Palpations: Abdomen is soft.     Tenderness: There is no abdominal tenderness. There is no rebound.  Musculoskeletal:     Cervical back: Normal range of motion.  Skin:    General: Skin is warm and dry.  Neurological:     Mental Status: She is alert and oriented to person, place, and time.     Coordination: Coordination normal.     Vitals:   06/18/22 1043  BP: 118/82  Pulse: 86  Resp: 18  SpO2: 99%  Weight: 145 lb 6.4 oz (66 kg)  Height: 5' 2.75" (1.594 m)    Assessment & Plan:

## 2022-06-19 ENCOUNTER — Encounter: Payer: Self-pay | Admitting: Internal Medicine

## 2022-06-19 ENCOUNTER — Ambulatory Visit (INDEPENDENT_AMBULATORY_CARE_PROVIDER_SITE_OTHER): Payer: Medicare Other

## 2022-06-19 DIAGNOSIS — E538 Deficiency of other specified B group vitamins: Secondary | ICD-10-CM | POA: Diagnosis not present

## 2022-06-19 MED ORDER — CYANOCOBALAMIN 1000 MCG/ML IJ SOLN
1000.0000 ug | Freq: Once | INTRAMUSCULAR | Status: AC
  Administered 2022-06-19: 1000 ug via INTRAMUSCULAR

## 2022-06-19 NOTE — Progress Notes (Signed)
After obtaining consent, and per orders of Dr. Crawford, injection of B12 given by Taci Sterling. Patient tolerated injection well in right deltoid and instructed to report any adverse reaction to me immediately.  

## 2022-07-03 ENCOUNTER — Ambulatory Visit (INDEPENDENT_AMBULATORY_CARE_PROVIDER_SITE_OTHER): Payer: Medicare Other

## 2022-07-03 DIAGNOSIS — E538 Deficiency of other specified B group vitamins: Secondary | ICD-10-CM | POA: Diagnosis not present

## 2022-07-03 MED ORDER — CYANOCOBALAMIN 1000 MCG/ML IJ SOLN
1000.0000 ug | Freq: Once | INTRAMUSCULAR | Status: AC
  Administered 2022-07-03: 1000 ug via INTRAMUSCULAR

## 2022-07-03 NOTE — Progress Notes (Addendum)
After obtaining consent, and per orders of Dr. Okey Dupre, injection of B12 given by Coolidge Breeze. Patient tolerated injection well in right deltoid,and instructed to report any adverse reaction to me immediately.

## 2022-07-09 ENCOUNTER — Telehealth: Payer: Self-pay | Admitting: *Deleted

## 2022-07-09 ENCOUNTER — Other Ambulatory Visit: Payer: Self-pay | Admitting: Internal Medicine

## 2022-07-09 DIAGNOSIS — Z1389 Encounter for screening for other disorder: Secondary | ICD-10-CM

## 2022-07-09 MED ORDER — NYSTATIN-TRIAMCINOLONE 100000-0.1 UNIT/GM-% EX OINT: 1.0000 | TOPICAL_OINTMENT | Freq: Every day | CUTANEOUS | 6 refills | Status: DC | PRN

## 2022-07-09 NOTE — Telephone Encounter (Signed)
Appointment scheduled.

## 2022-07-09 NOTE — Telephone Encounter (Signed)
Did you see pt regarding dermatologist referral../lmb

## 2022-07-09 NOTE — Telephone Encounter (Signed)
-----   Message from Janee Morn sent at 07/09/2022 11:29 AM EDT ----- Specialist office requires office notes for diagnosis. Pt need office visit to send referral.

## 2022-07-16 ENCOUNTER — Ambulatory Visit (INDEPENDENT_AMBULATORY_CARE_PROVIDER_SITE_OTHER): Payer: Medicare Other | Admitting: Internal Medicine

## 2022-07-16 ENCOUNTER — Encounter: Payer: Self-pay | Admitting: Internal Medicine

## 2022-07-16 VITALS — BP 138/80 | HR 62 | Temp 97.8°F | Ht 62.75 in | Wt 144.0 lb

## 2022-07-16 DIAGNOSIS — L719 Rosacea, unspecified: Secondary | ICD-10-CM

## 2022-07-16 DIAGNOSIS — L219 Seborrheic dermatitis, unspecified: Secondary | ICD-10-CM

## 2022-07-16 MED ORDER — KETOCONAZOLE 2 % EX CREA
1.0000 | TOPICAL_CREAM | Freq: Every day | CUTANEOUS | 6 refills | Status: DC
Start: 2022-07-16 — End: 2022-10-29

## 2022-07-16 MED ORDER — METRONIDAZOLE 0.75 % EX CREA
1.0000 | TOPICAL_CREAM | Freq: Two times a day (BID) | CUTANEOUS | 11 refills | Status: DC
Start: 2022-07-16 — End: 2023-06-19

## 2022-07-16 MED ORDER — KETOCONAZOLE 2 % EX SHAM
1.0000 | MEDICATED_SHAMPOO | Freq: Every day | CUTANEOUS | 6 refills | Status: DC
Start: 2022-07-16 — End: 2022-08-14

## 2022-07-16 NOTE — Patient Instructions (Signed)
We will get you in with dermatologist.

## 2022-07-16 NOTE — Progress Notes (Signed)
   Subjective:   Patient ID: Emily Page, female    DOB: [birth year], 71 y.o.   MRN: [id number]  HPI The patient is a 71 YO female coming in for referral to derm.  Review of Systems  Constitutional: Negative.   HENT: Negative.    Eyes: Negative.   Respiratory:  Negative for cough, chest tightness and shortness of breath.   Cardiovascular:  Negative for chest pain, palpitations and leg swelling.  Gastrointestinal:  Negative for abdominal distention, abdominal pain, constipation, diarrhea, nausea and vomiting.  Musculoskeletal: Negative.   Skin:  Positive for color change.  Neurological: Negative.   Psychiatric/Behavioral: Negative.      Objective:  Physical Exam Constitutional:      Appearance: She is well-developed.  HENT:     Head: Normocephalic and atraumatic.  Cardiovascular:     Rate and Rhythm: Normal rate and regular rhythm.  Pulmonary:     Effort: Pulmonary effort is normal. No respiratory distress.     Breath sounds: Normal breath sounds. No wheezing or rales.  Abdominal:     General: Bowel sounds are normal. There is no distension.     Palpations: Abdomen is soft.     Tenderness: There is no abdominal tenderness. There is no rebound.  Musculoskeletal:     Cervical back: Normal range of motion.  Skin:    General: Skin is warm and dry.     Findings: Rash present.  Neurological:     Mental Status: She is alert and oriented to person, place, and time.     Coordination: Coordination normal.     Vitals:   07/16/22 1402  BP: 138/80  Pulse: 62  Temp: 97.8 F (36.6 C)  TempSrc: Oral  SpO2: 97%  Weight: 144 lb (65.3 kg)  Height: 5' 2.75" (1.594 m)    Assessment & Plan:

## 2022-07-19 ENCOUNTER — Encounter: Payer: Self-pay | Admitting: Internal Medicine

## 2022-07-19 DIAGNOSIS — L219 Seborrheic dermatitis, unspecified: Secondary | ICD-10-CM | POA: Insufficient documentation

## 2022-07-19 DIAGNOSIS — L719 Rosacea, unspecified: Secondary | ICD-10-CM | POA: Insufficient documentation

## 2022-07-19 NOTE — Assessment & Plan Note (Signed)
Refilled ketoconazole cream and shampoo and referral to dermatology.

## 2022-07-19 NOTE — Assessment & Plan Note (Signed)
Refilled her metronidazole cream and referral to dermatology.

## 2022-07-22 ENCOUNTER — Ambulatory Visit (INDEPENDENT_AMBULATORY_CARE_PROVIDER_SITE_OTHER): Payer: Medicare Other | Admitting: *Deleted

## 2022-07-22 DIAGNOSIS — E538 Deficiency of other specified B group vitamins: Secondary | ICD-10-CM

## 2022-07-22 MED ORDER — CYANOCOBALAMIN 1000 MCG/ML IJ SOLN
1000.0000 ug | Freq: Once | INTRAMUSCULAR | Status: AC
  Administered 2022-07-22: 1000 ug via INTRAMUSCULAR

## 2022-07-22 NOTE — Progress Notes (Signed)
Patient here for her b12 injection. Given in right deltoid. Patient tolerated well 

## 2022-07-26 ENCOUNTER — Encounter: Payer: Self-pay | Admitting: Internal Medicine

## 2022-07-29 ENCOUNTER — Other Ambulatory Visit: Payer: Self-pay | Admitting: Cardiology

## 2022-08-05 ENCOUNTER — Ambulatory Visit (INDEPENDENT_AMBULATORY_CARE_PROVIDER_SITE_OTHER): Payer: Medicare Other

## 2022-08-05 DIAGNOSIS — E538 Deficiency of other specified B group vitamins: Secondary | ICD-10-CM

## 2022-08-05 MED ORDER — CYANOCOBALAMIN 1000 MCG/ML IJ SOLN
1000.0000 ug | Freq: Once | INTRAMUSCULAR | Status: AC
  Administered 2022-08-05: 1000 ug via INTRAMUSCULAR

## 2022-08-05 NOTE — Progress Notes (Signed)
After obtaining consent, and per orders of Dr. Okey Dupre, injection of B12 given on the right deltoid by Ferdie Ping. Patient instructed to report any adverse reaction to me immediately.

## 2022-08-07 ENCOUNTER — Other Ambulatory Visit: Payer: Self-pay | Admitting: Rheumatology

## 2022-08-07 NOTE — Telephone Encounter (Signed)
Next Visit: 11/13/2022   Last Visit: 05/13/2022   Last Fill: 05/03/2022   Dx: Fibromyalgia   Current Dose per office note on 05/13/2022: Flexeril 5 mg half tablet by mouth at bedtime as needed for muscle spasms   Okay to refill Flexeril?   

## 2022-08-12 ENCOUNTER — Encounter: Payer: Self-pay | Admitting: Internal Medicine

## 2022-08-13 ENCOUNTER — Other Ambulatory Visit: Payer: Self-pay | Admitting: *Deleted

## 2022-08-14 ENCOUNTER — Other Ambulatory Visit: Payer: Self-pay

## 2022-08-14 MED ORDER — KETOCONAZOLE 2 % EX SHAM: 1.0000 | MEDICATED_SHAMPOO | Freq: Every day | CUTANEOUS | 6 refills | Status: DC

## 2022-08-20 ENCOUNTER — Ambulatory Visit: Payer: Medicare Other

## 2022-08-27 ENCOUNTER — Ambulatory Visit (INDEPENDENT_AMBULATORY_CARE_PROVIDER_SITE_OTHER): Payer: Medicare Other

## 2022-08-27 DIAGNOSIS — E538 Deficiency of other specified B group vitamins: Secondary | ICD-10-CM

## 2022-08-27 MED ORDER — CYANOCOBALAMIN 1000 MCG/ML IJ SOLN
1000.0000 ug | Freq: Once | INTRAMUSCULAR | Status: AC
  Administered 2022-08-27: 1000 ug via INTRAMUSCULAR

## 2022-08-27 NOTE — Progress Notes (Signed)
Pt here for B12 injection per Dr. Sharlet Salina  B12 1028mcg given IM, and pt tolerated injection well.

## 2022-08-28 DIAGNOSIS — Z23 Encounter for immunization: Secondary | ICD-10-CM | POA: Diagnosis not present

## 2022-09-04 DIAGNOSIS — H35372 Puckering of macula, left eye: Secondary | ICD-10-CM | POA: Diagnosis not present

## 2022-09-04 DIAGNOSIS — H0102B Squamous blepharitis left eye, upper and lower eyelids: Secondary | ICD-10-CM | POA: Diagnosis not present

## 2022-09-04 DIAGNOSIS — H2511 Age-related nuclear cataract, right eye: Secondary | ICD-10-CM | POA: Diagnosis not present

## 2022-09-04 DIAGNOSIS — H18593 Other hereditary corneal dystrophies, bilateral: Secondary | ICD-10-CM | POA: Diagnosis not present

## 2022-10-29 ENCOUNTER — Encounter: Payer: Self-pay | Admitting: Internal Medicine

## 2022-10-29 ENCOUNTER — Ambulatory Visit (INDEPENDENT_AMBULATORY_CARE_PROVIDER_SITE_OTHER): Payer: Medicare Other | Admitting: *Deleted

## 2022-10-29 DIAGNOSIS — Z0000 Encounter for general adult medical examination without abnormal findings: Secondary | ICD-10-CM | POA: Diagnosis not present

## 2022-10-29 MED ORDER — KETOCONAZOLE 2 % EX CREA: 1.0000 | TOPICAL_CREAM | Freq: Every day | CUTANEOUS | 11 refills | Status: DC

## 2022-10-29 NOTE — Patient Instructions (Signed)

## 2022-10-29 NOTE — Progress Notes (Signed)
Subjective:   Emily Page is a 71 y.o. female who presents for an Initial Medicare Annual Wellness Visit. I connected with  Emily Page on 10/29/22 by a audio enabled telemedicine application and verified that I am speaking with the correct person using two identifiers.  Patient Location: Home  Provider Location: Home Office  I discussed the limitations of evaluation and management by telemedicine. The patient expressed understanding and agreed to proceed.  Review of Systems    Deferred to PCP Cardiac Risk Factors include: advanced age (>22men, >20 women);hypertension     Objective:    There were no vitals filed for this visit. There is no height or weight on file to calculate BMI.     10/29/2022    3:02 PM 04/10/2016    9:00 PM 04/17/2015   10:31 AM 07/28/2014   10:05 AM  Advanced Directives  Does Patient Have a Medical Advance Directive? Yes Yes Yes No  Type of Estate agent of [place];Living will Healthcare Power of [place];Living will Healthcare Power of [place];Living will   Does patient want to make changes to medical advance directive? No - Patient declined No - Patient declined No - Patient declined   Copy of Healthcare Power of Attorney in Chart? No - copy requested Yes No - copy requested   Would patient like information on creating a medical advance directive?    No - patient declined information    Current Medications (verified) Outpatient Encounter Medications as of 10/29/2022  Medication Sig   amLODipine (NORVASC) 2.5 MG tablet Take 1 tablet (2.5 mg total) by mouth daily.   aspirin EC 81 MG tablet Take 81 mg by mouth every evening.   CALCIUM PO Take 600 mg by mouth daily.    Carboxymethylcellulose Sodium (ARTIFICIAL TEARS OP) Apply to eye 2 (two) times daily.   cholecalciferol (VITAMIN D3) 25 MCG (1000 UNIT) tablet Take 1,000 Units by mouth daily.   clindamycin (CLEOCIN) 150 MG capsule Take 150 mg by mouth 4 (four) times daily.  As needed for dental procedures.   cyclobenzaprine (FLEXERIL) 5 MG tablet TAKE 1/2 TABLET BY MOUTH AT BEDTIME.   DENTA 5000 PLUS 1.1 % CREA dental cream See admin instructions.   esomeprazole (NEXIUM) 40 MG capsule Take 40 mg by mouth daily at 12 noon.   estradiol (ESTRACE) 2 MG tablet Take 1 tablet (2 mg total) by mouth daily.   fluticasone (FLONASE) 50 MCG/ACT nasal spray    ketoconazole (NIZORAL) 2 % cream Apply 1 Application topically daily.   ketoconazole (NIZORAL) 2 % shampoo Apply 1 Application topically daily.   lisinopril (ZESTRIL) 40 MG tablet TAKE 1 TABLET BY MOUTH EVERY DAY   Magnesium Malate 1250 (141.7 Mg) MG TABS Take 2 tablets by mouth daily.   metoprolol succinate (TOPROL-XL) 100 MG 24 hr tablet TAKE 1 TABLET (100 MG TOTAL) BY MOUTH DAILY. PLEASE KEEP UPCOMING APPT FOR FUTURE REFILLS.   metroNIDAZOLE (METROCREAM) 0.75 % cream Apply 1 Application topically 2 (two) times daily.   nystatin-triamcinolone ointment (MYCOLOG) Apply 1 Application topically daily as needed (to rash).   Probiotic Product (PROBIOTIC PO) Take by mouth daily.   No facility-administered encounter medications on file as of 10/29/2022.    Allergies (verified) Amoxil [amoxicillin], Penicillins, Tape, Sulfa antibiotics, Zanaflex [tizanidine], Polytrim [polymyxin b-trimethoprim], and Voltaren [diclofenac sodium]   History: Past Medical History:  Diagnosis Date   Allergic rhinitis    followed by Dr Willa Rough in the past   Fibromyalgia  followed by Dr Bedelia Person   Foot pain, right    w intermittent swelling felt to be related to how she is walking on her foot. 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 Did have occassional PVC's   Hypertension    Migraines    [facility].    Ocular rosacea    per patient   OSA (obstructive sleep apnea) 10/28/2016   Very mild   PVC (premature ventricular contraction)    Raynaud's disease    followed by Dr Corliss Skains and norvasc has helped   Rosacea    and seborrheic dermatitis followed by Dr Levy Sjogren   Past Surgical History:  Procedure Laterality Date   CARDIAC CATHETERIZATION     EYE SURGERY Left 10/2017   cataract extraction    HIP ARTHROPLASTY     HIP SURGERY     LEFT HEART CATHETERIZATION WITH CORONARY ANGIOGRAM N/A 07/28/2014   Procedure: LEFT HEART CATHETERIZATION WITH CORONARY ANGIOGRAM;  Surgeon: Marykay Lex, MD;  Location: [facility] CATH LAB;  Service: Cardiovascular;  Laterality: N/A;   LEG SURGERY     SPLIT NIGHT STUDY  04/10/2016   TOTAL HIP ARTHROPLASTY Right    TOTAL HIP ARTHROPLASTY Left 05/2020   Dr. Lequita Halt   VAGINAL HYSTERECTOMY     Family History  Problem Relation Age of Onset   Hypertension Mother    Heart attack Father    Hypertension Father    Hyperlipidemia Father    Social History   Socioeconomic History   Marital status: Married    Spouse name: Emily Page   Number of children: Not on file   Years of education: Not on file   Highest education level: Not on file  Occupational History   Not on file  Tobacco Use   Smoking status: Never   Smokeless tobacco: Never   Tobacco comments:    never used tobacco  Vaping Use   Vaping Use: Never used  Substance and Sexual Activity   Alcohol use: Yes    Alcohol/week: 10.0 standard drinks of alcohol    Types: 10 Glasses of Terrion Poblano per week    Comment: 1-2 daily   Drug use: Never   Sexual activity: Yes  Other Topics Concern   Not on file  Social History Narrative   Not on file   Social Determinants of Health   Financial Resource Strain: Low Risk  (10/29/2022)   Overall  Financial Resource Strain (CARDIA)    Difficulty of Paying Living Expenses: Not hard at all  Food Insecurity: No Food Insecurity (10/29/2022)   Hunger Vital Sign    Worried About Running Out of Food in the Last Year: Never true    Ran Out of Food in the Last Year: Never true  Transportation Needs: No Transportation Needs (10/29/2022)   PRAPARE - Administrator, Civil Service (Medical): No    Lack of Transportation (Non-Medical): No  Physical Activity: Insufficiently Active (10/29/2022)   Exercise Vital Sign    Days of Exercise per Week: 4 days    Minutes of Exercise per Session:  30 min  Stress: No Stress Concern Present (10/29/2022)   [employer] of Occupational Health - Occupational Stress Questionnaire    Feeling of Stress : Only a little  Social Connections: Moderately Isolated (10/29/2022)   Social Connection and Isolation Panel [NHANES]    Frequency of Communication with Friends and Family: More than three times a week    Frequency of Social Gatherings with Friends and Family: More than three times a week    Attends Religious Services: Never    Database administrator or Organizations: No    Attends Engineer, structural: Never    Marital Status: Married    Tobacco Counseling Counseling given: Not Answered Tobacco comments: never used tobacco   Clinical Intake:  Pre-visit preparation completed: Yes  Pain : No/denies pain     Nutritional Status: BMI 25 -29 Overweight Nutritional Risks: None Diabetes: No  How often do you need to have someone help you when you read instructions, pamphlets, or other written materials from your doctor or pharmacy?: 1 - Never  Diabetic?No  Interpreter Needed?: No  Information entered by :: Blanchie Serve   Activities of Daily Living    10/29/2022    3:02 PM  In your present state of health, do you have any difficulty performing the following activities:  Hearing? 0  Vision? 0  Difficulty concentrating or making  decisions? 0  Walking or climbing stairs? 0  Dressing or bathing? 0  Doing errands, shopping? 0  Preparing Food and eating ? N  Using the Toilet? N  In the past six months, have you accidently leaked urine? N  Do you have problems with loss of bowel control? N  Managing your Medications? N  Managing your Finances? N  Housekeeping or managing your Housekeeping? N    Patient Care Team: Myrlene Broker, MD as PCP - General (Internal Medicine) Juluis Rainier, MD (Inactive) as Referring Physician (Family Medicine) Duke Salvia, MD as Consulting Physician (Cardiology)  Indicate any recent Medical Services you may have received from other than [facility] providers in the past year (date may be approximate).     Assessment:   This is a routine wellness examination for Emily Page.  Hearing/Vision screen No results found.  Dietary issues and exercise activities discussed: Current Exercise Habits: Home exercise routine, Time (Minutes): 30, Frequency (Times/Week): 4, Weekly Exercise (Minutes/Week): 120, Intensity: Mild, Exercise limited by: orthopedic condition(s)   Goals Addressed             This Visit's Progress    Patient Stated       Eat healthier and increase my physical activity      Depression Screen    10/29/2022    2:58 PM 06/18/2022   10:54 AM 12/18/2021    2:13 PM 04/17/2015   10:32 AM 03/04/2014   11:00 AM  PHQ 2/9 Scores  PHQ - 2 Score 0 0 0 0 0  PHQ- 9 Score  5     Exception Documentation    Medical reason     Fall Risk    10/29/2022    3:02 PM 07/16/2022    2:25 PM 06/18/2022   10:54 AM 12/18/2021    2:13 PM 04/17/2015   10:32 AM  Fall Risk   Falls in the past year? 0 0 0 0 No  Number falls in past yr: 0 0 0 0   Injury with Fall? 0 0 0 0   Risk for fall due to : No Fall Risks  No Fall Risks     Follow up Falls evaluation completed Falls evaluation completed       FALL RISK PREVENTION PERTAINING TO THE HOME:  Any stairs in or around the home? Yes   If so, are there any without handrails? Yes  Home free of loose throw rugs in walkways, pet beds, electrical cords, etc? Yes  Adequate lighting in your home to reduce risk of falls? Yes   ASSISTIVE DEVICES UTILIZED TO PREVENT FALLS:  Life alert? No  Use of a cane, walker or w/c? No  Grab bars in the bathroom? No  Shower chair or bench in shower? No  Elevated toilet seat or a handicapped toilet? No   Cognitive Function:        10/29/2022    3:03 PM  6CIT Screen  What Year? 0 points  What month? 0 points  What time? 0 points  Count back from 20 0 points  Months in reverse 0 points  Repeat phrase 0 points  Total Score 0 points    Immunizations Immunization History  Administered Date(s) Administered   Fluad Quad(high Dose 65+) 08/28/2022   Influenza Split 09/07/2012   Influenza, High Dose Seasonal PF 08/29/2020   Influenza,inj,Quad PF,6+ Mos 08/24/2015, 09/24/2016, 09/09/2017, 09/24/2019   Influenza,inj,quad, With Preservative 08/16/2015, 08/20/2018   Influenza-Unspecified 09/11/2019   PFIZER(Purple Top)SARS-COV-2 Vaccination 01/03/2020, 01/28/2020   Pfizer Covid-19 Vaccine Bivalent Booster 39yrs & up 08/28/2022   Pneumococcal Conjugate-13 01/04/2016   Pneumococcal Polysaccharide-23 09/09/2017    TDAP status: Due, Education has been provided regarding the importance of this vaccine. Advised may receive this vaccine at local pharmacy or Health Dept. Aware to provide a copy of the vaccination record if obtained from local pharmacy or Health Dept. Verbalized acceptance and understanding.  Flu Vaccine status: Up to date  Pneumococcal vaccine status: Up to date  Covid-19 vaccine status: Information provided on how to obtain vaccines.   Qualifies for Shingles Vaccine? Yes   Zostavax completed No   Shingrix Completed?: No.    Education has been provided regarding the importance of this vaccine. Patient has been advised to call insurance company to determine out of pocket  expense if they have not yet received this vaccine. Advised may also receive vaccine at local pharmacy or Health Dept. Verbalized acceptance and understanding.  Screening Tests Health Maintenance  Topic Date Due   Hepatitis C Screening  Never done   DTaP/Tdap/Td (1 - Tdap) Never done   COLONOSCOPY (Pts 45-35yrs Insurance coverage will need to be confirmed)  Never done   MAMMOGRAM  05/18/2014   DEXA SCAN  Never done   COVID-19 Vaccine (4 - 2023-24 season) 10/23/2022   Medicare Annual Wellness (AWV)  10/30/2023   Pneumonia Vaccine 90+ Years old  Completed   INFLUENZA VACCINE  Completed   HPV VACCINES  Aged Out   Zoster Vaccines- Shingrix  Discontinued    Health Maintenance  Health Maintenance Due  Topic Date Due   Hepatitis C Screening  Never done   DTaP/Tdap/Td (1 - Tdap) Never done   COLONOSCOPY (Pts 45-23yrs Insurance coverage will need to be confirmed)  Never done   MAMMOGRAM  05/18/2014   DEXA SCAN  Never done   COVID-19 Vaccine (4 - 2023-24 season) 10/23/2022    Colorectal Cancer Screening: Patient states she had a colonoscopy approximately 5 years ago by  Dr. Dulce Sellar   Mammogram status: Completed 1/22. Repeat every year Per patient last 1/22 by [facility]  Bone Density status: Completed per patient around 3 years ago. Results reflect: Bone density results: NORMAL. Repeat every   years. Per patient last 3 years ago by [facility] [place]  Lung Cancer Screening: (Low Dose CT Chest recommended if Age 52-80 years, 30 pack-year currently smoking OR have quit w/in 15years.) does not qualify.   Additional Screening:  Hepatitis C Screening: does qualify; Completed education provided  Vision Screening: Recommended annual ophthalmology exams for early detection of glaucoma and other disorders of the eye. Is the patient up to date with their annual eye exam?  Yes  Who is the provider or what is the name of the office in which the  patient attends annual eye exams? Dr Sherrine Maples If pt is not established with a provider, would they like to be referred to a provider to establish care?  N/A .   Dental Screening: Recommended annual dental exams for proper oral hygiene  Community Resource Referral / Chronic Care Management: CRR required this visit?  No   CCM required this visit?  No      Plan:     I have personally reviewed and noted the following in the patient's chart:   Medical and social history Use of alcohol, tobacco or illicit drugs  Current medications and supplements including opioid prescriptions. Patient is not currently taking opioid prescriptions. Functional ability and status Nutritional status Physical activity Advanced directives List of other physicians Hospitalizations, surgeries, and ER visits in previous 12 months Vitals Screenings to include cognitive, depression, and falls Referrals and appointments  In addition, I have reviewed and discussed with patient certain preventive protocols, quality metrics, and best practice recommendations. A written personalized care plan for preventive services as well as general preventive health recommendations were provided to patient.     Wanda Plump, RN   10/29/2022   Nurse Notes:  Ms. Howey , Thank you for taking time to come for your Medicare Wellness Visit. I appreciate your ongoing commitment to your health goals. Please review the following plan we discussed and let me know if I can assist you in the future.   These are the goals we discussed:  Goals      Patient Stated     Eat healthier and increase my physical activity        This is a list of the screening recommended for you and due dates:  Health Maintenance  Topic Date Due   Hepatitis C Screening: USPSTF Recommendation to screen - Ages 35-79 yo.  Never done   DTaP/Tdap/Td vaccine (1 - Tdap) Never done   Colon Cancer Screening  Never done   Mammogram  05/18/2014   DEXA scan (bone  density measurement)  Never done   COVID-19 Vaccine (4 - 2023-24 season) 10/23/2022   Medicare Annual Wellness Visit  10/30/2023   Pneumonia Vaccine  Completed   Flu Shot  Completed   HPV Vaccine  Aged Out   Zoster (Shingles) Vaccine  Discontinued

## 2022-10-31 NOTE — Progress Notes (Signed)
Office Visit Note  Patient: Emily Page             Date of Birth: [birth year]           MRN: [id number]             PCP: Myrlene Broker, MD Referring: Myrlene Broker, * Visit Date: 11/13/2022 Occupation: @GUAROCC @  Subjective:  Pain in joints and muscles  History of Present Illness: Emily Page is a 71 y.o. female with history of osteoarthritis and fibromyalgia syndrome.  She states she continues to have some generalized pain and discomfort from fibromyalgia.  She has been taking cyclobenzaprine 5 mg half a tablet at bedtime on as needed basis.  She states she recently has been taking it every night.  She has been exercising using a bike at home.  She continues to have some discomfort in her left thigh.  Her both hip joints are replaced.  She has intermittent discomfort in her left knee joint.  She has not noticed any joint swelling.  She continues to have Raynaud's with discoloration in her hands.  She has not noticed any digital ulcers.  She states she has not started using hand warmers yet.  She has been using warm clothing.  Activities of Daily Living:  Patient reports morning stiffness for a few minutes.   Patient Reports nocturnal pain.  Difficulty dressing/grooming: Denies Difficulty climbing stairs: Denies Difficulty getting out of chair: Denies Difficulty using hands for taps, buttons, cutlery, and/or writing: Denies  Review of Systems  Constitutional:  Positive for fatigue.  HENT:  Positive for mouth dryness. Negative for mouth sores.   Eyes:  Positive for dryness.  Respiratory:  Negative for difficulty breathing.   Cardiovascular:  Positive for palpitations. Negative for chest pain.  Gastrointestinal:  Negative for blood in stool, constipation and diarrhea.  Endocrine: Negative for increased urination.  Genitourinary:  Negative for involuntary urination.  Musculoskeletal:  Positive for joint pain, joint pain, myalgias, muscle weakness, morning  stiffness, muscle tenderness and myalgias. Negative for gait problem and joint swelling.  Skin:  Negative for color change, rash, hair loss and sensitivity to sunlight.  Allergic/Immunologic: Positive for susceptible to infections.  Neurological:  Negative for dizziness and headaches.  Hematological:  Negative for swollen glands.  Psychiatric/Behavioral:  Negative for depressed mood and sleep disturbance. The patient is not nervous/anxious.     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   OSA (obstructive sleep apnea) 10/28/2016   Excessive daytime sleepiness 04/22/2016   Exertional shortness of breath 07/14/2014   Palpitations 07/14/2014   Allergic rhinitis 04/14/2014   Cervical pain 04/14/2014   Acid reflux 04/14/2014   Atypical migraine 04/14/2014   Arthralgia of multiple joints 04/14/2014   Hypertension    Chest pain, atypical 03/31/2014   PVC (premature ventricular contraction) 03/31/2014   Blood glucose elevated 02/02/2014    Past Medical History:  Diagnosis Date   Allergic rhinitis    followed by Dr 04/04/2014 in the past   Fibromyalgia    followed by Dr Willa Rough   Foot pain, right  w intermittent swelling felt to be related to how she is walking on her foot. evaluated by Dr Leticia Penna (podiatrist)   GERD (gastroesophageal reflux disease)    Followed by Dr Randa Evens in the past   Hip dislocation, bilateral (HCC)    as infant. was corrected   History of EKG    w palpitations and nonspecific T-wave changes, stress cardiolite 6/08 showed normal LV size  in a systolic function,no maximum effort stress test with moderate work load and good HR achieved,no EKG criteria of ischemia. Did have occassional PVC's   Hypertension    Migraines    follow[facility] past.    Ocular rosacea    per patient   OSA (obstructive sleep apnea) 10/28/2016   Very mild   PVC (premature ventricular contraction)    Raynaud's disease    followed by Dr Corliss Skains and norvasc has helped   Rosacea    and seborrheic dermatitis followed by Dr Levy Sjogren    Family History  Problem Relation Age of Onset   Hypertension Mother    Heart attack Father    Hypertension Father    Hyperlipidemia Father    Cancer Daughter        stage 3 colorectal cancer   Past Surgical History:  Procedure Laterality Date   CARDIAC CATHETERIZATION     EYE SURGERY Left 10/2017   cataract extraction    HIP ARTHROPLASTY     HIP SURGERY     LEFT HEART CATHETERIZATION WITH CORONARY ANGIOGRAM N/A 07/28/2014   Procedure: LEFT HEART CATHETERIZATION WITH CORONARY ANGIOGRAM;  Surgeon: Marykay Lex, MD;  Location: [facility] CATH LAB;  Service: Cardiovascular;  Laterality: N/A;   LEG SURGERY     SPLIT NIGHT STUDY  04/10/2016   TOTAL HIP ARTHROPLASTY Right    TOTAL HIP ARTHROPLASTY Left 05/2020   Dr. Lequita Halt   VAGINAL HYSTERECTOMY     Social History   Social History Narrative   Not on file   Immunization History  Administered Date(s) Administered   Fluad Quad(high Dose 65+) 08/28/2022   Influenza Split 09/07/2012   Influenza, High Dose Seasonal PF 08/29/2020   Influenza,inj,Quad PF,6+ Mos 08/24/2015, 09/24/2016, 09/09/2017, 09/24/2019   Influenza,inj,quad, With Preservative 08/16/2015, 08/20/2018   Influenza-Unspecified 09/11/2019   PFIZER(Purple Top)SARS-COV-2 Vaccination 01/03/2020, 01/28/2020   Pfizer Covid-19 Vaccine Bivalent Booster 13yrs & up 08/28/2022   Pneumococcal Conjugate-13 01/04/2016   Pneumococcal Polysaccharide-23 09/09/2017   Rsv, Bivalent, Protein Subunit  Rsvpref,pf Verdis Frederickson) 10/08/2022     Objective: Vital Signs: BP (!) 187/104 (BP Location: Left Arm, Patient Position: Sitting, Cuff Size: Normal)   Pulse 68   Resp 16   Ht 5' 2.5" (1.588 m)   Wt 147 lb 9.6 oz (67 kg)   BMI 26.57 kg/m    Physical Exam Vitals and nursing note reviewed.  Constitutional:      Appearance: She is well-developed.  HENT:     Head: Normocephalic and atraumatic.  Eyes:     Conjunctiva/sclera: Conjunctivae normal.  Cardiovascular:     Rate and Rhythm: Normal rate and regular rhythm.     Heart sounds: Normal heart sounds.  Pulmonary:     Effort: Pulmonary effort is normal.     Breath sounds: Normal breath sounds.  Abdominal:     General: Bowel sounds are normal.     Palpations: Abdomen is soft.  Musculoskeletal:     Cervical back: Normal range of motion.  Lymphadenopathy:     Cervical: No cervical adenopathy.  Skin:    General: Skin is warm and dry.     Capillary Refill: Capillary refill takes less than 2 seconds.  Neurological:     Mental Status: She is alert and oriented to person, place, and time.  Psychiatric:        Behavior: Behavior normal.      Musculoskeletal Exam: Cervical spine was in good range of motion.  She had some stiffness with range of motion of her lumbar spine.  Shoulder joints, elbow joints, wrist joints, MCPs PIPs and DIPs with good range of motion with no synovitis.  Hip joints were replaced and were in good range of motion.  Both knee joints were in good range of motion without any warmth swelling or effusion.  There was no tenderness over ankles or MTPs.  She had generalized hyperalgesia and tender points.  CDAI Exam: CDAI Score: -- Patient Global: --; Provider Global: -- Swollen: --; Tender: -- Joint Exam 11/13/2022   No joint exam has been documented for this visit   There is currently no information documented on the homunculus. Go to the Rheumatology activity and complete the homunculus joint  exam.  Investigation: No additional findings.  Imaging: No results found.  Recent Labs: Lab Results  Component Value Date   WBC 8.6 06/18/2022   HGB 12.8 06/18/2022   PLT 270.0 06/18/2022   NA 136 06/18/2022   K 4.6 06/18/2022   CL 99 06/18/2022   CO2 27 06/18/2022   GLUCOSE 109 (H) 06/18/2022   BUN 13 06/18/2022   CREATININE 0.90 06/18/2022   BILITOT 0.5 06/18/2022   ALKPHOS 64 06/18/2022   AST 21 06/18/2022   ALT 12 06/18/2022   PROT 6.9 06/18/2022   ALBUMIN 4.1 06/18/2022   CALCIUM 9.3 06/18/2022   GFRAA >60 10/19/2018    Speciality Comments: No specialty comments available.  Procedures:  No procedures performed Allergies: Amoxil [amoxicillin], Penicillins, Tape, Sulfa antibiotics, Zanaflex [tizanidine], Polytrim [polymyxin b-trimethoprim], and Voltaren [diclofenac sodium]   Assessment / Plan:     Visit Diagnoses: Fibromyalgia -she continues to have generalized pain and discomfort from fibromyalgia.  She has been taking Flexeril 5 mg half tablet by mouth at bedtime as needed for muscle spasms.  She states Flexeril is helpful to relax her muscles and also sleep at night.  She has been exercising on a regular basis.  She has a recumbent bike at home.  Raynaud's disease without gangrene -she continues to have Raynaud's phenomenon with discoloration in her hands.  She is on amlodipine 2.5 mg 1 tablet by mouth daily and remains on aspirin 81 mg 1 tablet daily.  Patient states she is unable to increase the dose of amlodipine due to fatigue.  Keeping the core temperature warm and warm clothing was discussed.  Use of hand warmer was discussed.  Sicca syndrome (HCC)-over-the-counter products were discussed.  Chronic pain of left knee-she continues to have intermittent pain in her knee joint.  No warmth swelling or effusion was noted.  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  She had good range of motion of bilateral hip joints.  History of  vitamin D deficiency-she is currently taking vitamin D 1000 units p.o. daily.  Her vitamin D was at range in July 2023.  She will follow-up with a vitamin D level with her PCP.  History of migraine  History of hypertension-blood pressure was elevated at 187/104.  Repeat blood pressure was also elevated.  I advised her to contact her PCP and  cardiologist today to get advice regarding management of her blood pressure.  She was advised to monitor blood pressure closely at home and follow-up with her cardiologist.  Holmes-Adie syndrome, unspecified laterality  Orders: No orders of the defined types were placed in this encounter.  No orders of the defined types were placed in this encounter.    Follow-Up Instructions: Return in about 6 months (around 05/15/2023) for FMS, OA.   Pollyann Savoy, MD  Note - This record has been created using Animal nutritionist.  Chart creation errors have been sought, but may not always  have been located. Such creation errors do not reflect on  the standard of medical care.

## 2022-11-11 ENCOUNTER — Other Ambulatory Visit: Payer: Self-pay | Admitting: Rheumatology

## 2022-11-11 NOTE — Telephone Encounter (Signed)
Next Visit: 11/13/2022   Last Visit: 05/13/2022   Last Fill: 05/03/2022   Dx: Fibromyalgia   Current Dose per office note on 05/13/2022: Flexeril 5 mg half tablet by mouth at bedtime as needed for muscle spasms   Okay to refill Flexeril?

## 2022-11-13 ENCOUNTER — Ambulatory Visit: Payer: Medicare Other | Attending: Rheumatology | Admitting: Rheumatology

## 2022-11-13 ENCOUNTER — Encounter: Payer: Self-pay | Admitting: Rheumatology

## 2022-11-13 VITALS — BP 187/104 | HR 68 | Resp 16 | Ht 62.5 in | Wt 147.6 lb

## 2022-11-13 DIAGNOSIS — M25562 Pain in left knee: Secondary | ICD-10-CM | POA: Diagnosis not present

## 2022-11-13 DIAGNOSIS — G8929 Other chronic pain: Secondary | ICD-10-CM | POA: Diagnosis not present

## 2022-11-13 DIAGNOSIS — Z96643 Presence of artificial hip joint, bilateral: Secondary | ICD-10-CM | POA: Diagnosis not present

## 2022-11-13 DIAGNOSIS — Z8679 Personal history of other diseases of the circulatory system: Secondary | ICD-10-CM | POA: Diagnosis not present

## 2022-11-13 DIAGNOSIS — I7300 Raynaud's syndrome without gangrene: Secondary | ICD-10-CM | POA: Insufficient documentation

## 2022-11-13 DIAGNOSIS — M3500 Sicca syndrome, unspecified: Secondary | ICD-10-CM | POA: Diagnosis not present

## 2022-11-13 DIAGNOSIS — H57059 Tonic pupil, unspecified eye: Secondary | ICD-10-CM | POA: Diagnosis not present

## 2022-11-13 DIAGNOSIS — Z8669 Personal history of other diseases of the nervous system and sense organs: Secondary | ICD-10-CM | POA: Insufficient documentation

## 2022-11-13 DIAGNOSIS — M797 Fibromyalgia: Secondary | ICD-10-CM | POA: Insufficient documentation

## 2022-11-13 DIAGNOSIS — Z8639 Personal history of other endocrine, nutritional and metabolic disease: Secondary | ICD-10-CM | POA: Insufficient documentation

## 2022-11-13 NOTE — Patient Instructions (Signed)

## 2022-12-19 ENCOUNTER — Encounter: Payer: Self-pay | Admitting: Internal Medicine

## 2022-12-19 ENCOUNTER — Ambulatory Visit (INDEPENDENT_AMBULATORY_CARE_PROVIDER_SITE_OTHER): Payer: Medicare Other | Admitting: Internal Medicine

## 2022-12-19 VITALS — BP 142/80 | HR 72 | Temp 98.0°F | Ht 62.0 in | Wt 148.6 lb

## 2022-12-19 DIAGNOSIS — I10 Essential (primary) hypertension: Secondary | ICD-10-CM | POA: Diagnosis not present

## 2022-12-19 DIAGNOSIS — Z1231 Encounter for screening mammogram for malignant neoplasm of breast: Secondary | ICD-10-CM | POA: Diagnosis not present

## 2022-12-19 NOTE — Assessment & Plan Note (Signed)
BP elevated likely due to stress given daughter's recent colon cancer diagnosis. This is mildly elevated today. Continue amlodipine 2.5 mg daily and lisinopril 40 mg daily.

## 2022-12-19 NOTE — Patient Instructions (Signed)
We have ordered the mammogram.

## 2022-12-19 NOTE — Progress Notes (Signed)
   Subjective:   Patient ID: Emily Page, female    DOB: June 17, 1951, 72 y.o.   MRN: [id number]  HPI The patient is a 72 YO female coming in for follow up.  Review of Systems  Constitutional: Negative.   HENT:  Positive for congestion.   Eyes: Negative.   Respiratory:  Negative for cough, chest tightness and shortness of breath.   Cardiovascular:  Negative for chest pain, palpitations and leg swelling.  Gastrointestinal:  Negative for abdominal distention, abdominal pain, constipation, diarrhea, nausea and vomiting.  Musculoskeletal: Negative.   Skin: Negative.   Neurological: Negative.   Psychiatric/Behavioral:  Positive for decreased concentration and dysphoric mood.     Objective:  Physical Exam Constitutional:      Appearance: She is well-developed.  HENT:     Head: Normocephalic and atraumatic.  Cardiovascular:     Rate and Rhythm: Normal rate and regular rhythm.  Pulmonary:     Effort: Pulmonary effort is normal. No respiratory distress.     Breath sounds: Normal breath sounds. No wheezing or rales.  Abdominal:     General: Bowel sounds are normal. There is no distension.     Palpations: Abdomen is soft.     Tenderness: There is no abdominal tenderness. There is no rebound.  Musculoskeletal:     Cervical back: Normal range of motion.  Skin:    General: Skin is warm and dry.  Neurological:     Mental Status: She is alert and oriented to person, place, and time.     Coordination: Coordination normal.  Psychiatric:     Comments: Appropriately tearful during visit     Vitals:   12/19/22 1052 12/19/22 1059  BP: (!) 150/80 (!) 142/80  Pulse: 72   Temp: 98 F (36.7 C)   TempSrc: Oral   SpO2: 97%   Weight: 148 lb 9.6 oz (67.4 kg)   Height: 5\' 2"  (1.575 m)     Assessment & Plan:  Visit time 20 minutes in face to face communication with patient and coordination of care, additional 5 minutes spent in record review, coordination or care, ordering tests,  communicating/referring to other healthcare professionals, documenting in medical records all on the same day of the visit for total time 25 minutes spent on the visit.

## 2022-12-24 ENCOUNTER — Other Ambulatory Visit: Payer: Self-pay | Admitting: Internal Medicine

## 2023-01-22 ENCOUNTER — Ambulatory Visit: Payer: Medicare Other

## 2023-02-01 ENCOUNTER — Other Ambulatory Visit: Payer: Self-pay | Admitting: Internal Medicine

## 2023-02-19 ENCOUNTER — Other Ambulatory Visit: Payer: Self-pay | Admitting: Physician Assistant

## 2023-02-19 NOTE — Telephone Encounter (Signed)
Last Fill: 11/11/2022  Next Visit: 05/15/2023  Last Visit: 11/13/2022  Dx: Fibromyalgia   Current Dose per office note on 11/13/2022: Flexeril 5 mg half tablet by mouth at bedtime as needed for muscle spasms   Okay to refill Flexeril?

## 2023-03-02 ENCOUNTER — Other Ambulatory Visit: Payer: Self-pay | Admitting: Cardiology

## 2023-03-04 ENCOUNTER — Ambulatory Visit
Admission: RE | Admit: 2023-03-04 | Discharge: 2023-03-04 | Disposition: A | Discharge status: Home / Self Care | Payer: Medicare Other | Source: Ambulatory Visit | Attending: Internal Medicine | Admitting: Internal Medicine

## 2023-03-04 DIAGNOSIS — Z1231 Encounter for screening mammogram for malignant neoplasm of breast: Secondary | ICD-10-CM | POA: Diagnosis not present

## 2023-03-06 ENCOUNTER — Encounter: Payer: Self-pay | Admitting: Internal Medicine

## 2023-03-06 DIAGNOSIS — H0102A Squamous blepharitis right eye, upper and lower eyelids: Secondary | ICD-10-CM | POA: Diagnosis not present

## 2023-03-06 DIAGNOSIS — H18593 Other hereditary corneal dystrophies, bilateral: Secondary | ICD-10-CM | POA: Diagnosis not present

## 2023-03-06 DIAGNOSIS — H0102B Squamous blepharitis left eye, upper and lower eyelids: Secondary | ICD-10-CM | POA: Diagnosis not present

## 2023-03-06 LAB — HM MAMMOGRAPHY

## 2023-03-13 ENCOUNTER — Ambulatory Visit: Payer: Medicare Other | Admitting: Internal Medicine

## 2023-03-25 ENCOUNTER — Other Ambulatory Visit: Payer: Self-pay | Admitting: Internal Medicine

## 2023-03-28 DIAGNOSIS — I951 Orthostatic hypotension: Secondary | ICD-10-CM | POA: Insufficient documentation

## 2023-04-01 ENCOUNTER — Encounter: Payer: Self-pay | Admitting: Internal Medicine

## 2023-04-01 ENCOUNTER — Ambulatory Visit: Payer: Medicare Other | Attending: Internal Medicine | Admitting: Internal Medicine

## 2023-04-01 VITALS — BP 183/91 | HR 59 | Ht 62.0 in | Wt 143.2 lb

## 2023-04-01 DIAGNOSIS — Z79899 Other long term (current) drug therapy: Secondary | ICD-10-CM | POA: Insufficient documentation

## 2023-04-01 DIAGNOSIS — I428 Other cardiomyopathies: Secondary | ICD-10-CM | POA: Diagnosis not present

## 2023-04-01 DIAGNOSIS — I10 Essential (primary) hypertension: Secondary | ICD-10-CM | POA: Diagnosis not present

## 2023-04-01 DIAGNOSIS — R002 Palpitations: Secondary | ICD-10-CM | POA: Insufficient documentation

## 2023-04-01 DIAGNOSIS — I951 Orthostatic hypotension: Secondary | ICD-10-CM | POA: Insufficient documentation

## 2023-04-01 MED ORDER — LISINOPRIL 40 MG PO TABS: 40.0000 mg | ORAL_TABLET | Freq: Every day | ORAL | 3 refills | Status: DC

## 2023-04-01 MED ORDER — SPIRONOLACTONE 25 MG PO TABS: 25.0000 mg | ORAL_TABLET | Freq: Every day | ORAL | 3 refills | Status: DC

## 2023-04-01 MED ORDER — METOPROLOL SUCCINATE ER 100 MG PO TB24: 100.0000 mg | ORAL_TABLET | Freq: Every day | ORAL | 2 refills | Status: DC

## 2023-04-01 MED ORDER — AMLODIPINE BESYLATE 2.5 MG PO TABS: 2.5000 mg | ORAL_TABLET | Freq: Every day | ORAL | 3 refills | Status: DC

## 2023-04-01 NOTE — Progress Notes (Signed)
Patient Care Team: Myrlene Broker, MD as PCP - General (Internal Medicine) Juluis Rainier, MD (Inactive) as Referring Physician (Family Medicine) Duke Salvia, MD as Consulting Physician (Cardiology)   HPI  Emily Page is a 72 y.o. female seen in follow-up for stereotypical spells suggestive of dysautonomia.  Also with PVCs  History of significant hypertension and this is been noted by her rheumatologist a number of months ago. The patient denies chest pain, shortness of breath, nocturnal dyspnea, orthopnea or peripheral edema.  There have been no palpitations or syncope.  Complains of intermittent lightheadedness.  Under a great deal of stress.  Her daughter has colorectal cancer and is undergoing surgery tomorrow.  Her mother has been in and out all the hospital with "losing her mind "this is been going on for a decade or more.  Remote sleep study, basically negative.Marland Kitchen      DATE TEST EF   8/15 Echo  45-50 %   9/15 Cath   Normal CA  11/16 cMRI 47 % No LGE  6/18 Echo 50-55 %   11/19 cMRI 59 %     Date Cr K Hgb  5/18 0.74 4.2 13.9 (8/15)  11/19 1.02    7/23 0.9 4.6 12.8       CPX 11/16 FVC 2.83 (100%)      FEV1 2.13 (94%)        FEV1/FVC 75 (96%)  Other studies personally reviewed: LH with recorded rhythms showing normal sinus during spells of LH and weakness  Records and Results Reviewed  Past Medical History:  Diagnosis Date   Allergic rhinitis    followed by Dr Willa Rough in the past   Fibromyalgia    followed by Dr Bedelia Person   Foot pain, right    w intermittent swelling felt to be related to how she is walking on her foot. evaluated by Dr Leticia Penna (podiatrist)   GERD (gastroesophageal reflux disease)    Followed by Dr Randa Evens in the past   Hip dislocation, bilateral (HCC)    as infant. was corrected   History of EKG    w palpitations and nonspecific T-wave changes, stress cardiolite 6/08 showed normal LV size in a systolic function,no  maximum effort stress test with moderate work load and good HR achieved,no EKG criteria of ischemia. Did have occassional PVC's   Hypertension    Migraines    followed by [facility] in past.    Ocular rosacea    per patient   OSA (obstructive sleep apnea) 10/28/2016   Very mild   PVC (premature ventricular contraction)    Raynaud's disease    followed by Dr Corliss Skains and norvasc has helped   Rosacea    and seborrheic dermatitis followed by Dr Levy Sjogren    Past Surgical History:  Procedure Laterality Date   BREAST BIOPSY Left    BREAST BIOPSY Left    CARDIAC CATHETERIZATION     EYE SURGERY Left 10/2017   cataract extraction    HIP ARTHROPLASTY     HIP SURGERY     LEFT HEART CATHETERIZATION WITH CORONARY ANGIOGRAM N/A 07/28/2014   Procedure: LEFT HEART CATHETERIZATION WITH CORONARY ANGIOGRAM;  Surgeon: Marykay Lex, MD;  Location: [facility] CATH LAB;  Service: Cardiovascular;  Laterality: N/A;   LEG SURGERY     SPLIT NIGHT STUDY  04/10/2016   TOTAL HIP ARTHROPLASTY Right    TOTAL HIP ARTHROPLASTY Left 05/2020   Dr. Lequita Halt   VAGINAL HYSTERECTOMY  Current Meds  Medication Sig   amLODipine (NORVASC) 2.5 MG tablet Take 1 tablet (2.5 mg total) by mouth daily.   aspirin EC 81 MG tablet Take 81 mg by mouth every evening.   CALCIUM PO Take 600 mg by mouth daily.    Carboxymethylcellulose Sodium (ARTIFICIAL TEARS OP) Apply to eye 2 (two) times daily.   cholecalciferol (VITAMIN D3) 25 MCG (1000 UNIT) tablet Take 1,000 Units by mouth daily.   clindamycin (CLEOCIN) 150 MG capsule Take 150 mg by mouth 4 (four) times daily. As needed for dental procedures.   cyclobenzaprine (FLEXERIL) 5 MG tablet TAKE 1/2 TABLET BY MOUTH EVERY DAY AT BEDTIME   DENTA 5000 PLUS 1.1 % CREA dental cream See admin instructions.   esomeprazole (NEXIUM) 40 MG capsule Take 40 mg by mouth daily at 12 noon.   estradiol (ESTRACE) 2 MG tablet Take 1 tablet (2 mg total) by mouth daily.   fluticasone  (FLONASE) 50 MCG/ACT nasal spray    ketoconazole (NIZORAL) 2 % cream Apply 1 Application topically daily.   ketoconazole (NIZORAL) 2 % shampoo Apply 1 Application topically daily.   lisinopril (ZESTRIL) 40 MG tablet TAKE 1 TABLET BY MOUTH EVERY DAY   Magnesium Malate 1250 (141.7 Mg) MG TABS Take 2 tablets by mouth daily.   metoprolol succinate (TOPROL-XL) 100 MG 24 hr tablet TAKE 1 TABLET (100 MG TOTAL) BY MOUTH DAILY. PLEASE KEEP UPCOMING APPT FOR FUTURE REFILLS.   metroNIDAZOLE (METROCREAM) 0.75 % cream Apply 1 Application topically 2 (two) times daily.   nystatin-triamcinolone ointment (MYCOLOG) Apply 1 Application topically daily as needed (to rash).   Probiotic Product (PROBIOTIC PO) Take by mouth daily.    Allergies  Allergen Reactions   Amoxil [Amoxicillin]     Felt like throat was swelling   Penicillins     Swelling of throat   Tape     Electrodes cause Blisters, even with the sensitive kind    Sulfa Antibiotics     Swelling of eyes   Zanaflex [Tizanidine]     halluciations   Polytrim [Polymyxin B-Trimethoprim]     unknown   Voltaren [Diclofenac Sodium] Rash     Review of Systems negative except from HPI and PMH  Physical Exam BP (!) 183/91 (Patient Position: Standing)   Pulse (!) 59   Ht 5\' 2"  (1.575 m)   Wt 143 lb 3.2 oz (65 kg)   SpO2 97%   BMI 26.19 kg/m  Well developed and nourished in no acute distress HENT normal Neck supple with JVP-  flat   Clear Regular rate and rhythm, no murmurs or gallops Abd-soft with active BS No Clubbing cyanosis edema Skin-warm and dry A & Oriented  Grossly normal sensory and motor function  ECG sinus at 66 Interval 13/08/42  CrCl cannot be calculated (Patient's most recent lab result is older than the maximum 21 days allowed.).   Assessment and  Plan  Orthostatic intolerance  Dyspnea  Palpitations  Cardiomyopathy mild  Hypertension   Hypertension is poorly controlled.  We will add spironolactone, higher  doses of amlodipine were associated with fatigue.  I recommended that she go to the hypertension clinic for drug up titration.  Will have to be a little bit careful as he has some tendency towards orthostasis.  Palpitations are reasonably controlled.  Stress is inordinate as noted above.  Extensive discussion.  Pwp          Current medicines are reviewed at length with the patient today .  The patient does not  have concerns regarding medicines.

## 2023-04-01 NOTE — Patient Instructions (Signed)
Medication Instructions:  Your physician has recommended you make the following change in your medication:   ** Begin Spironolactone 25mg  - 1 tablet by mouth daily  *If you need a refill on your cardiac medications before your next appointment, please call your pharmacy*   Lab Work: BMET in 2 weeks If you have labs (blood work) drawn today and your tests are completely normal, you will receive your results only by: MyChart Message (if you have MyChart) OR A paper copy in the mail If you have any lab test that is abnormal or we need to change your treatment, we will call you to review the results.   Testing/Procedures:  Referral to PharmD HTN clinic   Follow-Up: At [facility], you and your health needs are our priority.  As part of our continuing mission to provide you with exceptional heart care, we have created designated Provider Care Teams.  These Care Teams include your primary Cardiologist (physician) and Advanced Practice Providers (APPs -  Physician Assistants and Nurse Practitioners) who all work together to provide you with the care you need, when you need it.  We recommend signing up for the patient portal called "MyChart".  Sign up information is provided on this After Visit Summary.  MyChart is used to connect with patients for Virtual Visits (Telemedicine).  Patients are able to view lab/test results, encounter notes, upcoming appointments, etc.  Non-urgent messages can be sent to your provider as well.   To learn more about what you can do with MyChart, go to [email or web address].    Your next appointment:   6 months with Dr Graciela Husbands

## 2023-04-03 ENCOUNTER — Encounter: Payer: Self-pay | Admitting: Internal Medicine

## 2023-04-17 ENCOUNTER — Ambulatory Visit: Payer: Medicare Other | Attending: Internal Medicine

## 2023-04-17 DIAGNOSIS — Z79899 Other long term (current) drug therapy: Secondary | ICD-10-CM | POA: Diagnosis not present

## 2023-04-17 DIAGNOSIS — I10 Essential (primary) hypertension: Secondary | ICD-10-CM

## 2023-04-17 DIAGNOSIS — I428 Other cardiomyopathies: Secondary | ICD-10-CM

## 2023-04-17 DIAGNOSIS — I951 Orthostatic hypotension: Secondary | ICD-10-CM | POA: Diagnosis not present

## 2023-04-17 DIAGNOSIS — R002 Palpitations: Secondary | ICD-10-CM | POA: Diagnosis not present

## 2023-04-18 LAB — BASIC METABOLIC PANEL
BUN/Creatinine Ratio: 15 (ref 12–28)
BUN: 16 mg/dL (ref 8–27)
CO2: 22 mmol/L (ref 20–29)
Calcium: 9.8 mg/dL (ref 8.7–10.3)
Chloride: 97 mmol/L (ref 96–106)
Creatinine, Ser: 1.05 mg/dL — ABNORMAL HIGH (ref 0.57–1.00)
Glucose: 115 mg/dL — ABNORMAL HIGH (ref 70–99)
Potassium: 5 mmol/L (ref 3.5–5.2)
Sodium: 134 mmol/L (ref 134–144)
eGFR: 56 mL/min/{1.73_m2} — ABNORMAL LOW (ref >59)

## 2023-05-01 NOTE — Progress Notes (Deleted)
Office Visit Note  Patient: Emily Page             Date of Birth: 1951/05/04           MRN: [id number]             PCP: Myrlene Broker, MD Referring: Myrlene Broker, * Visit Date: 05/15/2023 Occupation: @GUAROCC @  Subjective:  No chief complaint on file.   History of Present Illness: Emily Page is a 72 y.o. female ***     Activities of Daily Living:  Patient reports morning stiffness for *** {minute/hour:19697}.   Patient {ACTIONS;DENIES/REPORTS:21021675::"Denies"} nocturnal pain.  Difficulty dressing/grooming: {ACTIONS;DENIES/REPORTS:21021675::"Denies"} Difficulty climbing stairs: {ACTIONS;DENIES/REPORTS:21021675::"Denies"} Difficulty getting out of chair: {ACTIONS;DENIES/REPORTS:21021675::"Denies"} Difficulty using hands for taps, buttons, cutlery, and/or writing: {ACTIONS;DENIES/REPORTS:21021675::"Denies"}  No Rheumatology ROS completed.   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   OSA (obstructive sleep apnea) 10/28/2016   Excessive daytime sleepiness 04/22/2016   Exertional shortness of breath 07/14/2014   Palpitations 07/14/2014   Allergic rhinitis 04/14/2014   Cervical pain 04/14/2014   Acid reflux 04/14/2014   Atypical migraine 04/14/2014   Arthralgia of multiple joints 04/14/2014   Hypertension    Chest pain, atypical 03/31/2014   PVC (premature ventricular contraction) 03/31/2014   Blood glucose elevated 02/02/2014    Past Medical History:  Diagnosis Date    Allergic rhinitis    followed by Dr Willa Rough in the past   Fibromyalgia    followed by Dr Bedelia Person   Foot pain, right    w intermittent swelling felt to be related to how she is walking on her foot. 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 Did have occassional PVC's   Hypertension    Migraines    [facility].    Ocular rosacea    per patient   OSA (obstructive sleep apnea) 10/28/2016   Very mild   PVC (premature ventricular contraction)    Raynaud's disease    followed by Dr Corliss Skains and norvasc has helped   Rosacea    and seborrheic dermatitis followed by Dr Levy Sjogren    Family History  Problem Relation Age of Onset   Hypertension Mother    Heart attack Father    Hypertension Father    Hyperlipidemia Father    Cancer Daughter        stage 3 colorectal cancer   Past Surgical History:  Procedure Laterality Date   BREAST BIOPSY Left    BREAST BIOPSY Left    CARDIAC CATHETERIZATION     EYE SURGERY Left 10/2017   cataract extraction    HIP ARTHROPLASTY     HIP SURGERY  LEFT HEART CATHETERIZATION WITH CORONARY ANGIOGRAM N/A 07/28/2014   Procedure: LEFT HEART CATHETERIZATION WITH CORONARY ANGIOGRAM;  Surgeon: Marykay Lex, MD;  Location: [facility] CATH LAB;  Service: Cardiovascular;  Laterality: N/A;   LEG SURGERY     SPLIT NIGHT STUDY  04/10/2016   TOTAL HIP ARTHROPLASTY Right    TOTAL HIP ARTHROPLASTY Left 05/2020   Dr. Lequita Halt   VAGINAL HYSTERECTOMY     Social History   Social History Narrative   Not on file   Immunization History  Administered Date(s) Administered    Fluad Quad(high Dose 65+) 08/28/2022   Influenza Split 09/07/2012   Influenza, High Dose Seasonal PF 08/29/2020   Influenza,inj,Quad PF,6+ Mos 08/24/2015, 09/24/2016, 09/09/2017, 09/24/2019   Influenza,inj,quad, With Preservative 08/16/2015, 08/20/2018   Influenza-Unspecified 09/11/2019   PFIZER(Purple Top)SARS-COV-2 Vaccination 01/03/2020, 01/28/2020   Pfizer Covid-19 Vaccine Bivalent Booster 33yrs & up 08/28/2022   Pneumococcal Conjugate-13 01/04/2016   Pneumococcal Polysaccharide-23 09/09/2017   Rsv, Bivalent, Protein Subunit Rsvpref,pf Verdis Frederickson) 10/08/2022     Objective: Vital Signs: There were no vitals taken for this visit.   Physical Exam   Musculoskeletal Exam: ***  CDAI Exam: CDAI Score: -- Patient Global: --; Provider Global: -- Swollen: --; Tender: -- Joint Exam 05/15/2023   No joint exam has been documented for this visit   There is currently no information documented on the homunculus. Go to the Rheumatology activity and complete the homunculus joint exam.  Investigation: No additional findings.  Imaging: No results found.  Recent Labs: Lab Results  Component Value Date   WBC 8.6 06/18/2022   HGB 12.8 06/18/2022   PLT 270.0 06/18/2022   NA 134 04/17/2023   K 5.0 04/17/2023   CL 97 04/17/2023   CO2 22 04/17/2023   GLUCOSE 115 (H) 04/17/2023   BUN 16 04/17/2023   CREATININE 1.05 (H) 04/17/2023   BILITOT 0.5 06/18/2022   ALKPHOS 64 06/18/2022   AST 21 06/18/2022   ALT 12 06/18/2022   PROT 6.9 06/18/2022   ALBUMIN 4.1 06/18/2022   CALCIUM 9.8 04/17/2023   GFRAA >60 10/19/2018    Speciality Comments: No specialty comments available.  Procedures:  No procedures performed Allergies: Amoxil [amoxicillin], Penicillins, Tape, Sulfa antibiotics, Zanaflex [tizanidine], Polytrim [polymyxin b-trimethoprim], and Voltaren [diclofenac sodium]   Assessment / Plan:     Visit Diagnoses: No diagnosis found.  Orders: No orders of the defined types were  placed in this encounter.  No orders of the defined types were placed in this encounter.   Face-to-face time spent with patient was *** minutes. Greater than 50% of time was spent in counseling and coordination of care.  Follow-Up Instructions: No follow-ups on file.   Ellen Henri, CMA  Note - This record has been created using Animal nutritionist.  Chart creation errors have been sought, but may not always  have been located. Such creation errors do not reflect on  the standard of medical care.

## 2023-05-05 ENCOUNTER — Ambulatory Visit: Payer: Medicare Other | Attending: Internal Medicine | Admitting: Pharmacist

## 2023-05-05 VITALS — BP 104/62 | HR 87

## 2023-05-05 DIAGNOSIS — I10 Essential (primary) hypertension: Secondary | ICD-10-CM | POA: Insufficient documentation

## 2023-05-05 MED ORDER — SPIRONOLACTONE 25 MG PO TABS: 12.5000 mg | ORAL_TABLET | Freq: Every day | ORAL | 3 refills | Status: AC

## 2023-05-05 NOTE — Progress Notes (Addendum)
Patient ID: Emily Page                 DOB: 31-May-1951                      MRN: [id number]     HPI: Emily Page is a 72 y.o. female referred by Dr. Graciela Husbands to HTN clinic. PMH is significant for HTN, PVCs, orthostasis and dysautonomia, fibromyalgia, and Raynaud's. Last seen by Dr Graciela Husbands on 04/01/23 where BP was elevated at 183/91. Pt was also under a great deal of stress. She was started on spironolactone 25mg  daily and referred to PharmD for follow up. F/u BMET with slight increase in SCr from 0.9 to 1.05, K increase from 4.6 to 5, and Na decrease from 136 to 134.  I previously saw pt for HTN management in 2018. At that time, she was taking lisinopril, Toprol, hydralazine, and amlodipine. It had been recommended to avoid diuretics given pt's dry mouth and dry eyes. She is still on these meds except for hydralazine which looks to have been stopped 12/28/2021 due to systolic BP of 110.  Pt presents today for follow up. Going to the bathroom a bit more since starting spironolactone. No dizziness out of the ordinary, has chronic LE edema but hasn't worsened or gotten better on spironolactone, denies headaches. Hasn't been wearing compression stockings since it's the summer. Has fatigue she attributes to amlodipine (looks like low dose was previously stopped but her BP increased quite a bit after). Has not noticed worsening of dry eyes or mouth. Does feel a bit weak and like she can't catch her breath. Checked her BP initially right after starting spironolactone, BP was 160/80s then, hasn't checked more recently though. Uses a bicep cuff, reports she's brought this to an office visit in the past and it measured accurately. Technique sounds appropriate. Rarely uses NSAIDs. Still has stress with her mother's passing, overall a bit improved since her last visit. Her daughter is now cancer-free and her granddaughter recently graduated.   Current HTN meds:  Amlodipine 2.5mg  daily - night Lisinopril 40mg   daily - night Toprol 100mg  daily - morning Spironolactone 25mg  daily - morning  Previously tried:  Amlodipine 2.5-10mg  - LE edema Hydralazine - stopped when SBP was 110  BP goal: <130/1mmHg  Family History: Mother with HTN, father with HTN, HLD, and MI. Daughter with stage 3 colorectal cancer.  Social History: 1-2 alcoholic drinks per day, no drug use, no tobacco use.  Diet: 1-2 cups of coffee.   Wt Readings from Last 3 Encounters:  04/01/23 143 lb 3.2 oz (65 kg)  12/19/22 148 lb 9.6 oz (67.4 kg)  11/13/22 147 lb 9.6 oz (67 kg)   BP Readings from Last 3 Encounters:  04/01/23 (!) 183/91  12/19/22 (!) 142/80  11/13/22 (!) 187/104   Pulse Readings from Last 3 Encounters:  04/01/23 (!) 59  12/19/22 72  11/13/22 68    Renal function: CrCl cannot be calculated (Unknown ideal weight.).  Past Medical History:  Diagnosis Date   Allergic rhinitis    followed by Dr Willa Rough in the past   Fibromyalgia    followed by Dr Bedelia Person   Foot pain, right    w intermittent swelling felt to be related to how she is walking on her foot. evaluated by Dr Leticia Penna (podiatrist)   GERD (gastroesophageal reflux disease)    Followed by Dr Randa Evens in the past   Hip dislocation, bilateral (HCC)  as infant. was corrected   History of EKG    w palpitations and nonspecific T-wave changes, stress cardiolite 6/08 showed normal LV size in a systolic function,no maximum effort stress test with moderate work load and good HR achieved,no EKG criteria of ischemia. Did have occassional PVC's   Hypertension    Migraines    follow[facility] in past.    Ocular rosacea    per patient   OSA (obstructive sleep apnea) 10/28/2016   Very mild   PVC (premature ventricular contraction)    Raynaud's disease    followed by Dr Corliss Skains and norvasc has helped   Rosacea    and seborrheic dermatitis followed by Dr Levy Sjogren    Current Outpatient Medications on File Prior to Visit  Medication  Sig Dispense Refill   amLODipine (NORVASC) 2.5 MG tablet Take 1 tablet (2.5 mg total) by mouth daily. 90 tablet 3   aspirin EC 81 MG tablet Take 81 mg by mouth every evening.     CALCIUM PO Take 600 mg by mouth daily.      Carboxymethylcellulose Sodium (ARTIFICIAL TEARS OP) Apply to eye 2 (two) times daily.     cholecalciferol (VITAMIN D3) 25 MCG (1000 UNIT) tablet Take 1,000 Units by mouth daily.     clindamycin (CLEOCIN) 150 MG capsule Take 150 mg by mouth 4 (four) times daily. As needed for dental procedures.     cyclobenzaprine (FLEXERIL) 5 MG tablet TAKE 1/2 TABLET BY MOUTH EVERY DAY AT BEDTIME 15 tablet 2   DENTA 5000 PLUS 1.1 % CREA dental cream See admin instructions.     esomeprazole (NEXIUM) 40 MG capsule Take 40 mg by mouth daily at 12 noon.     estradiol (ESTRACE) 2 MG tablet Take 1 tablet (2 mg total) by mouth daily. 90 tablet 3   fluticasone (FLONASE) 50 MCG/ACT nasal spray      ketoconazole (NIZORAL) 2 % cream Apply 1 Application topically daily. 90 g 11   ketoconazole (NIZORAL) 2 % shampoo Apply 1 Application topically daily. 360 mL 6   lisinopril (ZESTRIL) 40 MG tablet Take 1 tablet (40 mg total) by mouth daily. 90 tablet 3   Magnesium Malate 1250 (141.7 Mg) MG TABS Take 2 tablets by mouth daily.     metoprolol succinate (TOPROL-XL) 100 MG 24 hr tablet Take 1 tablet (100 mg total) by mouth daily. 90 tablet 2   metroNIDAZOLE (METROCREAM) 0.75 % cream Apply 1 Application topically 2 (two) times daily. 45 g 11   nystatin-triamcinolone ointment (MYCOLOG) Apply 1 Application topically daily as needed (to rash). 100 g 6   Probiotic Product (PROBIOTIC PO) Take by mouth daily.     spironolactone (ALDACTONE) 25 MG tablet Take 1 tablet (25 mg total) by mouth daily. 90 tablet 3   No current facility-administered medications on file prior to visit.    Allergies  Allergen Reactions   Amoxil [Amoxicillin]     Felt like throat was swelling   Penicillins     Swelling of throat   Tape      Electrodes cause Blisters, even with the sensitive kind    Sulfa Antibiotics     Swelling of eyes   Zanaflex [Tizanidine]     halluciations   Polytrim [Polymyxin B-Trimethoprim]     unknown   Voltaren [Diclofenac Sodium] Rash     Assessment/Plan:  1. Hypertension - BP at goal <130/12mmHg today although is a bit low - 108/64 in L arm and 104/62  in R arm, pt also feeling a bit weak. Historically BP readings have been quite elevated in clinic. Will decrease her spironolactone from 25mg  to 12.5mg  daily. She will continue amlodipine 2.5mg  daily, lisinopril 40mg  daily, and Toprol 100mg  daily. Encouraged pt to resume monitoring BP at home. She will send me BP readings in the next 3-4 weeks.  Emily Page, PharmD, BCACP, CPP [facility] [address] [place], [place] [address] Phone: [phone]; Fax: [phone] 05/05/2023 3:03 PM

## 2023-05-05 NOTE — Patient Instructions (Addendum)
Your blood pressure goal is < 130/80  Your pressure is a bit low today  Decrease your spironolactone from 1 tablet (25mg ) to 1/2 tablet (12.5mg ) daily  Continue taking your other medications  Monitor and record your blood pressure at home  Send me a MyChart message in the next 3-4 weeks with an update on how your readings are looking  To check your pressure at home you will need to:   Sit up in a chair, with feet flat on the floor and back supported. Do not cross your ankles or legs. Rest your left arm so that the cuff is about heart level. If the cuff goes on your upper arm, then just relax your arm on the table, arm of the chair, or your lap. If you have a wrist cuff, hold your wrist against your chest at heart level. Place the cuff snugly around your arm, about 1 inch above the crease of your elbow. The cords should be inside the groove of your elbow.  Sit quietly, with the cuff in place, for about 5 minutes. Then press the power button to start a reading. Do not talk or move while the reading is taking place.  Record your readings on a sheet of paper. Although most cuffs have a memory, it is often easier to see a pattern developing when the numbers are all in front of you.  You can repeat the reading after 1-3 minutes if it is recommended.   Make sure your bladder is empty and you have not had caffeine or tobacco within the last 30 minutes   Always bring your blood pressure log with you to your appointments. If you have not brought your monitor in to be double checked for accuracy, please bring it to your next appointment.   You can find a list of validated (accurate) blood pressure cuffs at: validatebp.org

## 2023-05-14 NOTE — Progress Notes (Signed)
Office Visit Note  Patient: Emily Page             Date of Birth: 06-09-1951           MRN: [id number]             PCP: Myrlene Broker, MD Referring: Myrlene Broker, * Visit Date: 05/22/2023 Occupation: @GUAROCC @  Subjective:  Myofascial pain   History of Present Illness: Emily Page is a 72 y.o. female with history of fibromyalgia and raynaud's.  Patient continues to experience intermittent myalgias and muscle tenderness due to fibromyalgia.  She has been under increased rest recently since the passing of her mother.  She has been taking Flexeril 5 mg half tablet at bedtime for insomnia and muscle spasms that she finds to be helpful.  She is been having difficulty sleeping at night recently which has been worsening her daytime fatigue.  She has tried melatonin in the past but no longer finds it to be effective. Patient continues to experience intermittent symptoms of Raynaud's phenomenon on a weekly basis.  She has not had any digital ulcerations.  She has not noticed any skin tightness or thickening.  She remains on amlodipine 2.5 mg daily and aspirin 81 mg daily.   Activities of Daily Living:  Patient reports morning stiffness for a few minutes intermittently.  Patient Reports nocturnal pain.  Difficulty dressing/grooming: Denies Difficulty climbing stairs: Denies Difficulty getting out of chair: Denies Difficulty using hands for taps, buttons, cutlery, and/or writing: Denies  Review of Systems  Constitutional:  Positive for fatigue.  HENT:  Positive for mouth dryness. Negative for mouth sores.   Eyes:  Positive for dryness.  Respiratory:  Positive for shortness of breath.   Cardiovascular:  Positive for chest pain and palpitations.  Gastrointestinal:  Negative for blood in stool, constipation and diarrhea.  Endocrine: Negative for increased urination.  Genitourinary:  Negative for involuntary urination.  Musculoskeletal:  Positive for joint pain,  joint pain, joint swelling, myalgias, muscle weakness, morning stiffness, muscle tenderness and myalgias. Negative for gait problem.  Skin:  Positive for color change and sensitivity to sunlight. Negative for rash and hair loss.  Allergic/Immunologic: Positive for susceptible to infections.  Neurological:  Positive for dizziness, tremors, numbness and parasthesias. Negative for headaches.  Hematological:  Negative for swollen glands.  Psychiatric/Behavioral:  Positive for sleep disturbance. Negative for depressed mood. The patient is not nervous/anxious.     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   OSA (obstructive sleep apnea) 10/28/2016   Excessive daytime sleepiness 04/22/2016   Exertional shortness of breath 07/14/2014   Palpitations 07/14/2014   Allergic rhinitis 04/14/2014   Cervical pain 04/14/2014   Acid reflux 04/14/2014   Atypical migraine 04/14/2014   Arthralgia of multiple joints 04/14/2014   Hypertension    Chest pain, atypical 03/31/2014   PVC (premature ventricular contraction) 03/31/2014   Blood glucose elevated 02/02/2014    Past Medical History:  Diagnosis Date   Allergic rhinitis    followed by Dr Willa Rough in the past   Fibromyalgia  followed by Dr Bedelia Person   Foot pain, right    w intermittent swelling felt to be related to how she is walking on her foot. 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 Did have occassional PVC's   Hypertension    Migraines    followed by [facility] in past.    Ocular rosacea    per patient   OSA (obstructive sleep apnea) 10/28/2016   Very mild   PVC (premature ventricular contraction)    Raynaud's disease    followed by Dr Corliss Skains and norvasc has helped   Rosacea    and seborrheic dermatitis followed by Dr Levy Sjogren    Family History  Problem Relation Age of Onset   Hypertension Mother    Heart attack Father    Hypertension Father    Hyperlipidemia Father    Cancer Daughter        stage 3 colorectal cancer   Past Surgical History:  Procedure Laterality Date   BREAST BIOPSY Left    BREAST BIOPSY Left    CARDIAC CATHETERIZATION     EYE SURGERY Left 10/2017   cataract extraction    HIP ARTHROPLASTY     HIP SURGERY     LEFT HEART CATHETERIZATION WITH CORONARY ANGIOGRAM N/A 07/28/2014   Procedure: LEFT HEART CATHETERIZATION WITH CORONARY ANGIOGRAM;  Surgeon: Marykay Lex, MD;  Location: [facility] CATH LAB;  Service: Cardiovascular;  Laterality: N/A;   LEG SURGERY     SPLIT NIGHT STUDY  04/10/2016   TOTAL HIP ARTHROPLASTY Right    TOTAL HIP ARTHROPLASTY Left 05/2020   Dr. Lequita Halt   VAGINAL HYSTERECTOMY     Social History   Social History Narrative   Not on file   Immunization History  Administered Date(s) Administered   Fluad Quad(high Dose 65+) 08/28/2022   Influenza Split 09/07/2012   Influenza, High Dose Seasonal PF 08/29/2020   Influenza,inj,Quad PF,6+ Mos 08/24/2015, 09/24/2016, 09/09/2017, 09/24/2019   Influenza,inj,quad, With Preservative 08/16/2015, 08/20/2018   Influenza-Unspecified 09/11/2019   PFIZER(Purple Top)SARS-COV-2 Vaccination  01/03/2020, 01/28/2020   Pfizer Covid-19 Vaccine Bivalent Booster 37yrs & up 08/28/2022   Pneumococcal Conjugate-13 01/04/2016   Pneumococcal Polysaccharide-23 09/09/2017   Rsv, Bivalent, Protein Subunit Rsvpref,pf Verdis Frederickson) 10/08/2022     Objective: Vital Signs: BP 129/81 (BP Location: Left Arm, Patient Position: Sitting, Cuff Size: Normal)   Pulse 71   Resp 17   Ht 5\' 3"  (1.6 m)   Wt 142 lb 6.4 oz (64.6 kg)   BMI 25.23 kg/m    Physical Exam Vitals and nursing note reviewed.  Constitutional:      Appearance: She is well-developed.  HENT:     Head: Normocephalic and atraumatic.  Eyes:     Conjunctiva/sclera: Conjunctivae normal.  Cardiovascular:     Rate and Rhythm: Normal rate and regular rhythm.     Heart sounds: Normal heart sounds.  Pulmonary:     Effort: Pulmonary effort is normal.     Breath sounds: Normal breath sounds.  Abdominal:     General: Bowel sounds are normal.     Palpations: Abdomen is soft.  Musculoskeletal:     Cervical back: Normal range of motion.  Skin:    General: Skin is warm and dry.     Capillary Refill: Capillary refill takes less than 2 seconds.     Comments: No signs of sclerodactyly.  Neurological:     Mental Status: She is alert and oriented to person, place, and time.  Psychiatric:        Behavior: Behavior normal.      Musculoskeletal Exam: Generalized hyperalgesia and positive tender points.  C-spine limited ROM.  Trapezius muscle tension and tenderness bilaterally.  Shoulder joints, elbow joints, wrist joints, MCPs, PIPs, DIPs have good range of motion with no synovitis.  Complete fist formation bilaterally.  Hip replacements have good range of motion with no groin pain.  Tenderness over bilateral trochanteric bursa.  Knee joints have good range of motion with no warmth or effusion.  Ankle joints have good range of motion with no tenderness or joint swelling.  CDAI Exam: CDAI Score: -- Patient Global: --; Provider Global:  -- Swollen: --; Tender: -- Joint Exam 05/22/2023   No joint exam has been documented for this visit   There is currently no information documented on the homunculus. Go to the Rheumatology activity and complete the homunculus joint exam.  Investigation: No additional findings.  Imaging: No results found.  Recent Labs: Lab Results  Component Value Date   WBC 8.6 06/18/2022   HGB 12.8 06/18/2022   PLT 270.0 06/18/2022   NA 134 04/17/2023   K 5.0 04/17/2023   CL 97 04/17/2023   CO2 22 04/17/2023   GLUCOSE 115 (H) 04/17/2023   BUN 16 04/17/2023   CREATININE 1.05 (H) 04/17/2023   BILITOT 0.5 06/18/2022   ALKPHOS 64 06/18/2022   AST 21 06/18/2022   ALT 12 06/18/2022   PROT 6.9 06/18/2022   ALBUMIN 4.1 06/18/2022   CALCIUM 9.8 04/17/2023   GFRAA >60 10/19/2018    Speciality Comments: No specialty comments available.  Procedures:  No procedures performed Allergies: Amoxil [amoxicillin], Penicillins, Tape, Sulfa antibiotics, Zanaflex [tizanidine], Polytrim [polymyxin b-trimethoprim], and Voltaren [diclofenac sodium]   Assessment / Plan:     Visit Diagnoses: Fibromyalgia -She has generalized hyperalgesia and positive tender points on examination.  Patient has been under increased stress grieving the loss of her mother.  She has noticed increased myofascial pain as well as fatigue.  She has had increased difficulty sleeping at night which is contributed to her level of fatigue.  Discussed the importance of regular exercise and good sleep hygiene.  She takes Flexeril 5 mg half tablet by mouth at bedtime as needed.  A refill of Flexeril sent to the pharmacy today.  She will follow up in 6 months or sooner if needed.   Raynaud's disease without gangrene -She has intermittent symptoms of Raynaud's phenomenon.  Her symptoms occur on a weekly basis typically.  She has not had any digital ulcerations or signs of gangrene.  No signs of sclerodactyly were noted today.  Capillary refill is  less than 2 seconds.  She continues to take amlodipine 2.5 mg 1 tablet by mouth daily and remains on aspirin 81 mg 1 tablet daily. Patient was advised to notify us if she develops any new or worsening symptoms.  Sicca syndrome (HCC):Chronic, Unchanged.  Chronic pain of left knee: Good range of motion with no discomfort.  No warmth or effusion noted.  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  Doing well.  Good range  of motion with no groin pain currently.  Other medical conditions are listed as follows:  History of vitamin D deficiency: She is taking vitamin D 1000 units daily.  History of migraine  History of hypertension: Blood pressure was 129/81 today in the office.  Holmes-Adie syndrome, unspecified laterality  Orders: No orders of the defined types were placed in this encounter.  Meds ordered this encounter  Medications   cyclobenzaprine (FLEXERIL) 5 MG tablet    Sig: Take 1/2 tablet by mouth at bedtime.    Dispense:  15 tablet    Refill:  2     Follow-Up Instructions: Return in about 6 months (around 11/21/2023) for Fibromyalgia.   Gearldine Bienenstock, PA-C  Note - This record has been created using Dragon software.  Chart creation errors have been sought, but may not always  have been located. Such creation errors do not reflect on  the standard of medical care.

## 2023-05-15 ENCOUNTER — Ambulatory Visit: Payer: Medicare Other | Admitting: Physician Assistant

## 2023-05-15 ENCOUNTER — Ambulatory Visit: Payer: Medicare Other | Admitting: Rheumatology

## 2023-05-15 DIAGNOSIS — Z8669 Personal history of other diseases of the nervous system and sense organs: Secondary | ICD-10-CM

## 2023-05-15 DIAGNOSIS — I7300 Raynaud's syndrome without gangrene: Secondary | ICD-10-CM

## 2023-05-15 DIAGNOSIS — H57059 Tonic pupil, unspecified eye: Secondary | ICD-10-CM

## 2023-05-15 DIAGNOSIS — Z8679 Personal history of other diseases of the circulatory system: Secondary | ICD-10-CM

## 2023-05-15 DIAGNOSIS — G8929 Other chronic pain: Secondary | ICD-10-CM

## 2023-05-15 DIAGNOSIS — Z96643 Presence of artificial hip joint, bilateral: Secondary | ICD-10-CM

## 2023-05-15 DIAGNOSIS — Z8639 Personal history of other endocrine, nutritional and metabolic disease: Secondary | ICD-10-CM

## 2023-05-15 DIAGNOSIS — M797 Fibromyalgia: Secondary | ICD-10-CM

## 2023-05-15 DIAGNOSIS — M3500 Sicca syndrome, unspecified: Secondary | ICD-10-CM

## 2023-05-22 ENCOUNTER — Encounter: Payer: Self-pay | Admitting: Physician Assistant

## 2023-05-22 ENCOUNTER — Ambulatory Visit: Payer: Medicare Other | Attending: Rheumatology | Admitting: Physician Assistant

## 2023-05-22 VITALS — BP 129/81 | HR 71 | Resp 17 | Ht 63.0 in | Wt 142.4 lb

## 2023-05-22 DIAGNOSIS — Z96643 Presence of artificial hip joint, bilateral: Secondary | ICD-10-CM | POA: Diagnosis not present

## 2023-05-22 DIAGNOSIS — H57059 Tonic pupil, unspecified eye: Secondary | ICD-10-CM

## 2023-05-22 DIAGNOSIS — Z8679 Personal history of other diseases of the circulatory system: Secondary | ICD-10-CM

## 2023-05-22 DIAGNOSIS — M797 Fibromyalgia: Secondary | ICD-10-CM | POA: Diagnosis not present

## 2023-05-22 DIAGNOSIS — G8929 Other chronic pain: Secondary | ICD-10-CM

## 2023-05-22 DIAGNOSIS — Z8639 Personal history of other endocrine, nutritional and metabolic disease: Secondary | ICD-10-CM | POA: Diagnosis not present

## 2023-05-22 DIAGNOSIS — I7300 Raynaud's syndrome without gangrene: Secondary | ICD-10-CM

## 2023-05-22 DIAGNOSIS — Z8669 Personal history of other diseases of the nervous system and sense organs: Secondary | ICD-10-CM

## 2023-05-22 DIAGNOSIS — M3500 Sicca syndrome, unspecified: Secondary | ICD-10-CM | POA: Diagnosis not present

## 2023-05-22 DIAGNOSIS — M25562 Pain in left knee: Secondary | ICD-10-CM | POA: Diagnosis not present

## 2023-05-22 MED ORDER — CYCLOBENZAPRINE HCL 5 MG PO TABS: ORAL_TABLET | ORAL | 2 refills | Status: DC

## 2023-06-03 ENCOUNTER — Encounter: Payer: Self-pay | Admitting: Pharmacist

## 2023-06-19 ENCOUNTER — Ambulatory Visit: Payer: Medicare Other | Admitting: Internal Medicine

## 2023-06-19 ENCOUNTER — Encounter: Payer: Self-pay | Admitting: Internal Medicine

## 2023-06-19 VITALS — BP 118/82 | HR 71 | Temp 97.3°F | Ht 63.0 in | Wt 144.0 lb

## 2023-06-19 DIAGNOSIS — L219 Seborrheic dermatitis, unspecified: Secondary | ICD-10-CM | POA: Diagnosis not present

## 2023-06-19 DIAGNOSIS — I10 Essential (primary) hypertension: Secondary | ICD-10-CM

## 2023-06-19 DIAGNOSIS — R739 Hyperglycemia, unspecified: Secondary | ICD-10-CM

## 2023-06-19 MED ORDER — NYSTATIN-TRIAMCINOLONE 100000-0.1 UNIT/GM-% EX OINT: 1.0000 | TOPICAL_OINTMENT | Freq: Every day | CUTANEOUS | 6 refills | Status: AC | PRN

## 2023-06-19 MED ORDER — ESTRADIOL 2 MG PO TABS: 2.0000 mg | ORAL_TABLET | Freq: Every day | ORAL | 3 refills | Status: DC

## 2023-06-19 MED ORDER — KETOCONAZOLE 2 % EX SHAM: 1.0000 | MEDICATED_SHAMPOO | Freq: Every day | CUTANEOUS | 6 refills | Status: AC

## 2023-06-19 MED ORDER — METRONIDAZOLE 0.75 % EX CREA: 1.0000 | TOPICAL_CREAM | Freq: Two times a day (BID) | CUTANEOUS | 11 refills | Status: DC

## 2023-06-19 MED ORDER — KETOCONAZOLE 2 % EX CREA: 1.0000 | TOPICAL_CREAM | Freq: Every day | CUTANEOUS | 11 refills | Status: AC

## 2023-06-19 NOTE — Progress Notes (Signed)
   Subjective:   Patient ID: Emily Page, female    DOB: [birth year], 72 y.o.   MRN: [id number]  HPI The patient is a 72 YO female coming in for follow up.   Review of Systems  Constitutional: Negative.   HENT: Negative.    Eyes: Negative.   Respiratory:  Negative for cough, chest tightness and shortness of breath.   Cardiovascular:  Negative for chest pain, palpitations and leg swelling.  Gastrointestinal:  Negative for abdominal distention, abdominal pain, constipation, diarrhea, nausea and vomiting.  Musculoskeletal: Negative.   Skin: Negative.   Neurological: Negative.   Psychiatric/Behavioral: Negative.      Objective:  Physical Exam Constitutional:      Appearance: She is well-developed.  HENT:     Head: Normocephalic and atraumatic.  Cardiovascular:     Rate and Rhythm: Normal rate and regular rhythm.  Pulmonary:     Effort: Pulmonary effort is normal. No respiratory distress.     Breath sounds: Normal breath sounds. No wheezing or rales.  Abdominal:     General: Bowel sounds are normal. There is no distension.     Palpations: Abdomen is soft.     Tenderness: There is no abdominal tenderness. There is no rebound.  Musculoskeletal:     Cervical back: Normal range of motion.  Skin:    General: Skin is warm and dry.  Neurological:     Mental Status: She is alert and oriented to person, place, and time.     Coordination: Coordination normal.     Vitals:   06/19/23 1044  BP: 118/82  Pulse: 71  Temp: (!) 97.3 F (36.3 C)  TempSrc: Temporal  SpO2: 99%  Weight: 144 lb (65.3 kg)  Height: 5\' 3"  (1.6 m)    Assessment & Plan:

## 2023-06-19 NOTE — Patient Instructions (Addendum)
We will leave the medicine the same today. We will plan to recheck the labs in around 6 months

## 2023-06-20 NOTE — Assessment & Plan Note (Signed)
Plan to recheck Hga1c in 6 months per patient preference. Counseled about need for diet and exercise.

## 2023-06-20 NOTE — Assessment & Plan Note (Signed)
Needs refill of ointment and shampoo.

## 2023-06-20 NOTE — Assessment & Plan Note (Signed)
Discussed recent renal function labs and will repeat in 6 months. BP at goal on amlodipine 2.5 mg daily and lisinopril 40 mg daily and metoprolol 100 mg daily and spironolactone 12.5 mg daily.

## 2023-06-29 ENCOUNTER — Encounter: Payer: Self-pay | Admitting: Internal Medicine

## 2023-06-30 ENCOUNTER — Ambulatory Visit (INDEPENDENT_AMBULATORY_CARE_PROVIDER_SITE_OTHER): Payer: Medicare Other | Admitting: Internal Medicine

## 2023-06-30 VITALS — BP 90/60 | HR 81 | Temp 98.6°F | Ht 63.0 in | Wt 141.0 lb

## 2023-06-30 DIAGNOSIS — R0989 Other specified symptoms and signs involving the circulatory and respiratory systems: Secondary | ICD-10-CM | POA: Insufficient documentation

## 2023-06-30 MED ORDER — PROMETHAZINE-DM 6.25-15 MG/5ML PO SYRP: 5.0000 mL | ORAL_SOLUTION | Freq: Four times a day (QID) | ORAL | 0 refills | Status: DC | PRN

## 2023-06-30 MED ORDER — DOXYCYCLINE HYCLATE 100 MG PO TABS: 100.0000 mg | ORAL_TABLET | Freq: Two times a day (BID) | ORAL | 0 refills | Status: DC

## 2023-06-30 NOTE — Patient Instructions (Signed)
We have sent in doxycycline to take 1 pill twice a day for 1 week. We have sent in the cough medicine to use as well.

## 2023-06-30 NOTE — Progress Notes (Unsigned)
   Subjective:   Patient ID: Emily Page, female    DOB: [birth year], 72 y.o.   MRN: [id number]  HPI The patient is a 72 YO female coming in for sick visit, cough and fevers going on 8-9 days now. Not improving.  Review of Systems  Constitutional:  Positive for activity change, appetite change and chills. Negative for fatigue, fever and unexpected weight change.  HENT:  Positive for congestion, postnasal drip, rhinorrhea and sinus pressure. Negative for ear discharge, ear pain, sinus pain, sneezing, sore throat, tinnitus, trouble swallowing and voice change.   Eyes: Negative.   Respiratory:  Positive for cough. Negative for chest tightness, shortness of breath and wheezing.   Cardiovascular: Negative.   Gastrointestinal: Negative.   Musculoskeletal:  Positive for myalgias.  Neurological: Negative.     Objective:  Physical Exam Constitutional:      Appearance: She is well-developed.  HENT:     Head: Normocephalic and atraumatic.     Comments: Oropharynx with redness and clear drainage, nose with swollen turbinates, TMs normal bilaterally.  Neck:     Thyroid: No thyromegaly.  Cardiovascular:     Rate and Rhythm: Normal rate and regular rhythm.  Pulmonary:     Effort: Pulmonary effort is normal. No respiratory distress.     Breath sounds: Rhonchi present. No wheezing or rales.  Abdominal:     General: Bowel sounds are normal. There is no distension.     Palpations: Abdomen is soft.     Tenderness: There is no abdominal tenderness. There is no rebound.  Musculoskeletal:        General: Tenderness present.     Cervical back: Normal range of motion.  Lymphadenopathy:     Cervical: No cervical adenopathy.  Skin:    General: Skin is warm and dry.  Neurological:     Mental Status: She is alert and oriented to person, place, and time.     Coordination: Coordination normal.     Vitals:   06/30/23 1403  BP: 90/60  Pulse: 81  Temp: 98.6 F (37 C)  TempSrc: Oral  SpO2:  98%  Weight: 141 lb (64 kg)  Height: 5\' 3"  (1.6 m)    Assessment & Plan:

## 2023-06-30 NOTE — Assessment & Plan Note (Signed)
Suspect early CAP. Rx doxycycline and promethazine/dm cough syrup. If no improvement

## 2023-07-11 ENCOUNTER — Encounter: Payer: Self-pay | Admitting: Internal Medicine

## 2023-07-11 NOTE — Telephone Encounter (Signed)
Please advise for patient

## 2023-07-22 ENCOUNTER — Other Ambulatory Visit: Payer: Self-pay | Admitting: Physician Assistant

## 2023-07-22 NOTE — Telephone Encounter (Signed)
Last Fill: 05/22/2023  Next Visit: 11/11/2023  Last Visit: 05/22/2023  Dx: Fibromyalgia   Current Dose per office note on 05/22/2023: Flexeril 5 mg half tablet by mouth at bedtime as needed.   Okay to refill Flexeril?

## 2023-09-04 DIAGNOSIS — H2511 Age-related nuclear cataract, right eye: Secondary | ICD-10-CM | POA: Diagnosis not present

## 2023-09-04 DIAGNOSIS — H18593 Other hereditary corneal dystrophies, bilateral: Secondary | ICD-10-CM | POA: Diagnosis not present

## 2023-09-04 DIAGNOSIS — Z961 Presence of intraocular lens: Secondary | ICD-10-CM | POA: Diagnosis not present

## 2023-09-04 DIAGNOSIS — H35372 Puckering of macula, left eye: Secondary | ICD-10-CM | POA: Diagnosis not present

## 2023-09-16 DIAGNOSIS — Z23 Encounter for immunization: Secondary | ICD-10-CM | POA: Diagnosis not present

## 2023-10-15 ENCOUNTER — Ambulatory Visit: Payer: Medicare Other | Admitting: Internal Medicine

## 2023-10-28 NOTE — Progress Notes (Signed)
Office Visit Note  Patient: Emily Page             Date of Birth: [birth year]           MRN: [id number]             PCP: Myrlene Broker, MD Referring: Myrlene Broker, * Visit Date: 11/11/2023 Occupation: @GUAROCC @  Subjective:  Pain in joints and muscles  History of Present Illness: Emily Page is a 72 y.o. female with fibromyalgia and Raynauds.  She states she continues to have generalized pain and discomfort from fibromyalgia.  She has muscle spasms in discomfort at night.  She takes cyclobenzaprine 5 mg half a tablet at bedtime which helps with the muscle spasms and insomnia.  She denies any side effects of cyclobenzaprine.  She is noticing increased Raynaud's symptoms with the weather change.  She is on amlodipine 2.5 mg p.o. daily.    Activities of Daily Living:  Patient reports morning stiffness for 1-2 hours.   Patient Reports nocturnal pain.  Difficulty dressing/grooming: Denies Difficulty climbing stairs: Denies Difficulty getting out of chair: Denies Difficulty using hands for taps, buttons, cutlery, and/or writing: Denies  Review of Systems  Constitutional:  Positive for fatigue.  HENT:  Positive for mouth dryness. Negative for mouth sores.   Eyes:  Positive for dryness.  Respiratory:  Positive for shortness of breath.   Cardiovascular:  Positive for palpitations. Negative for chest pain.  Gastrointestinal:  Negative for blood in stool, constipation and diarrhea.  Endocrine: Negative for increased urination.  Genitourinary:  Negative for involuntary urination.  Musculoskeletal:  Positive for joint pain, joint pain, myalgias, muscle weakness, morning stiffness, muscle tenderness and myalgias. Negative for gait problem and joint swelling.  Skin:  Positive for color change. Negative for rash, hair loss and sensitivity to sunlight.  Allergic/Immunologic: Positive for susceptible to infections.  Neurological:  Positive for dizziness and  headaches.  Hematological:  Positive for swollen glands.  Psychiatric/Behavioral:  Positive for sleep disturbance. Negative for depressed mood. The patient is nervous/anxious.     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   OSA (obstructive sleep apnea) 10/28/2016   Excessive daytime sleepiness 04/22/2016   Exertional shortness of breath 07/14/2014   Palpitations 07/14/2014   Allergic rhinitis 04/14/2014   Cervical pain 04/14/2014   Acid reflux 04/14/2014   Atypical migraine 04/14/2014   Arthralgia of multiple joints 04/14/2014   Hypertension    Chest pain, atypical 03/31/2014   PVC (premature ventricular contraction) 03/31/2014   Blood glucose elevated 02/02/2014    Past Medical History:  Diagnosis Date   Allergic rhinitis    followed by Dr Willa Rough in the past   Fibromyalgia    followed by Dr Bedelia Person   Foot pain, right    w intermittent swelling felt to be related to how she is walking on her foot. evaluated by Dr Leticia Penna (podiatrist)   GERD (gastroesophageal reflux disease)    Followed by Dr Randa Evens in the past   Hip dislocation, bilateral (  HCC)    as infant. was corrected   History of EKG    w palpitations and nonspecific T-wave changes, stress cardiolite 6/08 showed normal LV size in a systolic function,no maximum effort stress test with moderate work load and good HR achieved,no EKG criteria of ischemia. Did have occassional PVC's   Hypertension    Migraines     [facility].    Ocular rosacea    per patient   OSA (obstructive sleep apnea) 10/28/2016   Very mild   PVC (premature ventricular contraction)    Raynaud's disease    followed by Dr Corliss Skains and norvasc has helped   Rosacea    and seborrheic dermatitis followed by Dr Levy Sjogren    Family History  Problem Relation Age of Onset   Hypertension Mother    Heart attack Father    Hypertension Father    Hyperlipidemia Father    Cancer Daughter        stage 3 colorectal cancer   Past Surgical History:  Procedure Laterality Date   BREAST BIOPSY Left    BREAST BIOPSY Left    CARDIAC CATHETERIZATION     EYE SURGERY Left 10/2017   cataract extraction    HIP ARTHROPLASTY     HIP SURGERY     LEFT HEART CATHETERIZATION WITH CORONARY ANGIOGRAM N/A 07/28/2014   Procedure: LEFT HEART CATHETERIZATION WITH CORONARY ANGIOGRAM;  Surgeon: Marykay Lex, MD;  Location: [facility] CATH LAB;  Service: Cardiovascular;  Laterality: N/A;   LEG SURGERY     SPLIT NIGHT STUDY  04/10/2016   TOTAL HIP ARTHROPLASTY Right    TOTAL HIP ARTHROPLASTY Left 05/2020   Dr. Lequita Halt   VAGINAL HYSTERECTOMY     Social History   Social History Narrative   Not on file   Immunization History  Administered Date(s) Administered   Fluad Quad(high Dose 65+) 08/28/2022   Influenza Split 09/07/2012   Influenza, High Dose Seasonal PF 08/29/2020   Influenza,inj,Quad PF,6+ Mos 08/24/2015, 09/24/2016, 09/09/2017, 09/24/2019   Influenza,inj,quad, With Preservative 08/16/2015, 08/20/2018   Influenza-Unspecified 09/11/2019   PFIZER(Purple Top)SARS-COV-2 Vaccination 01/03/2020, 01/28/2020   Pfizer Covid-19 Vaccine Bivalent Booster 22yrs & up 08/28/2022   Pneumococcal Conjugate-13 01/04/2016   Pneumococcal Polysaccharide-23 09/09/2017   Rsv, Bivalent, Protein Subunit Rsvpref,pf Verdis Frederickson) 10/08/2022     Objective: Vital Signs: BP 122/81 (BP Location: Left Arm, Patient Position: Sitting, Cuff Size:  Normal)   Pulse 85   Resp 15   Ht 5\' 3"  (1.6 m)   Wt 147 lb 3.2 oz (66.8 kg)   BMI 26.08 kg/m    Physical Exam Vitals and nursing note reviewed.  Constitutional:      Appearance: She is well-developed.  HENT:     Head: Normocephalic and atraumatic.  Eyes:     Conjunctiva/sclera: Conjunctivae normal.  Cardiovascular:     Rate and Rhythm: Normal rate and regular rhythm.     Heart sounds: Normal heart sounds.  Pulmonary:     Effort: Pulmonary effort is normal.     Breath sounds: Normal breath sounds.  Abdominal:     General: Bowel sounds are normal.     Palpations: Abdomen is soft.  Musculoskeletal:     Cervical back: Normal range of motion.  Lymphadenopathy:     Cervical: No cervical adenopathy.  Skin:    General: Skin is warm and dry.     Capillary Refill: Capillary refill takes less than 2 seconds.  Neurological:     Mental  Status: She is alert and oriented to person, place, and time.  Psychiatric:        Behavior: Behavior normal.      Musculoskeletal Exam: Cervical, thoracic and lumbar spine were in good range of motion.  She had discomfort range of motion of the entire spine.  She had bilateral trapezius spasm.  Shoulders, elbows, wrists, MCPs PIPs and DIPs been good range of motion with no synovitis.  Hip joints were replaced and in good range of motion.  She had tenderness of bilateral trochanteric region.  Knee joints were in good range of motion without any warmth swelling or effusion.  There was no tenderness over ankles or MTPs.  CDAI Exam: CDAI Score: -- Patient Global: --; Provider Global: -- Swollen: --; Tender: -- Joint Exam 11/11/2023   No joint exam has been documented for this visit   There is currently no information documented on the homunculus. Go to the Rheumatology activity and complete the homunculus joint exam.  Investigation: No additional findings.  Imaging: No results found.  Recent Labs: Lab Results  Component Value Date   WBC 8.6  06/18/2022   HGB 12.8 06/18/2022   PLT 270.0 06/18/2022   NA 138 11/10/2023   K 4.6 11/10/2023   CL 98 11/10/2023   CO2 22 11/10/2023   GLUCOSE 192 (H) 11/10/2023   BUN 18 11/10/2023   CREATININE 1.04 (H) 11/10/2023   BILITOT 0.5 06/18/2022   ALKPHOS 64 06/18/2022   AST 21 06/18/2022   ALT 12 06/18/2022   PROT 6.9 06/18/2022   ALBUMIN 4.1 06/18/2022   CALCIUM 9.2 11/10/2023   GFRAA >60 10/19/2018    Speciality Comments: No specialty comments available.  Procedures:  No procedures performed Allergies: Amoxil [amoxicillin], Penicillins, Tape, Sulfa antibiotics, Zanaflex [tizanidine], Polytrim [polymyxin b-trimethoprim], and Voltaren [diclofenac sodium]   Assessment / Plan:     Visit Diagnoses: Fibromyalgia -she continues to have generalized pain and discomfort.  She had positive tender points and muscle spasm.  She is on  Flexeril 5 mg half tablet by mouth at bedtime as needed.  She has been tolerating it well.  A prescription refill of Flexeril was given today.  Raynaud's disease without gangrene - amlodipine 2.5 mg 1 tablet by mouth daily.  Patient states she has been taking aspirin 81 mg p.o. daily for Raynauds.  I advised her to discontinue aspirin as there is increased risk than benefit.  Sicca syndrome (HCC)-she continues to have dry mouth and dry eyes.  Over-the-counter products were discussed at length.  Chronic pain of left knee-she has intermittent pain in her left knee joint.  No warmth swelling or effusion was noted.  Lower extremity muscle strength exercises were discussed.  A handout on exercise was placed in the AVS.  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  She had good range of motion without discomfort today.  History of vitamin D deficiency  History of migraine  History of hypertension-blood pressure was normal at 122/81 today.  Holmes-Adie syndrome, unspecified laterality  Orders: No orders of the defined types were  placed in this encounter.  Meds ordered this encounter  Medications   cyclobenzaprine (FLEXERIL) 5 MG tablet    Sig: TAKE 1/2 TABLET BY MOUTH EVERY DAY AT BEDTIME    Dispense:  15 tablet    Refill:  2     Follow-Up Instructions: Return in about 6 months (around 05/11/2024) for Fibromyalgia, Raynaud's.   Pollyann Savoy, MD  Note - This  record has been created using [employer].  Chart creation errors have been sought, but may not always  have been located. Such creation errors do not reflect on  the standard of medical care.

## 2023-11-10 ENCOUNTER — Ambulatory Visit: Payer: Medicare Other

## 2023-11-10 ENCOUNTER — Encounter: Payer: Self-pay | Admitting: Internal Medicine

## 2023-11-10 ENCOUNTER — Ambulatory Visit: Payer: Medicare Other | Attending: Internal Medicine | Admitting: Internal Medicine

## 2023-11-10 VITALS — BP 111/71 | HR 89 | Ht 63.0 in | Wt 147.6 lb

## 2023-11-10 DIAGNOSIS — I951 Orthostatic hypotension: Secondary | ICD-10-CM | POA: Insufficient documentation

## 2023-11-10 DIAGNOSIS — R002 Palpitations: Secondary | ICD-10-CM | POA: Diagnosis not present

## 2023-11-10 DIAGNOSIS — I429 Cardiomyopathy, unspecified: Secondary | ICD-10-CM | POA: Insufficient documentation

## 2023-11-10 MED ORDER — METOPROLOL SUCCINATE ER 100 MG PO TB24: 50.0000 mg | ORAL_TABLET | Freq: Every day | ORAL | Status: DC

## 2023-11-10 NOTE — Progress Notes (Signed)
Patient Care Team: Myrlene Broker, MD as PCP - General (Internal Medicine) Juluis Rainier, MD (Inactive) as Referring Physician (Family Medicine) Duke Salvia, MD as Consulting Physician (Cardiology)   HPI  Emily Page is a 72 y.o. female seen in follow-up for stereotypical spells suggestive of dysautonomia.  Also with PVCs  History of significant hypertension and this is been noted by her rheumatologist a number of months ago.  The patient denies chest pain, nocturnal dyspnea, orthopnea or peripheral edema.  There have been no syncope.  Complains of good days and bad days, the latter being characterized by more fatigue in the morning or palpitations during the day or lightheadedness..     Remote sleep study 2017 sanguine, basically negative..    Her daughter has colorectal cancer and is undergoing surgery tomorrow  > now cancer free.  Her mother died May 13, 2023     DATE TEST EF   August 04, 2023 Echo  45-50 %   9/15 Cath   Normal CA  11/16 cMRI 47 % No LGE  6/18 Echo 50-55 %   11/19 cMRI 59 %     Date Cr K Hgb  5/18 0.74 4.2 13.9 08/04/23)  11/19 1.02    7/23 0.9 4.6 12.8  05-13-23 1.05 5.0        CPX 11/16 FVC 2.83 (100%)      FEV1 2.13 (94%)        FEV1/FVC 75 (96%)  Other studies personally reviewed: LH with recorded rhythms showing normal sinus during spells of LH and weakness  Records and Results Reviewed  Past Medical History:  Diagnosis Date   Allergic rhinitis    followed by Dr Willa Rough in the past   Fibromyalgia    followed by Dr Bedelia Person   Foot pain, right    w intermittent swelling felt to be related to how she is walking on her foot. evaluated by Dr Leticia Penna (podiatrist)   GERD (gastroesophageal reflux disease)    Followed by Dr Randa Evens in the past   Hip dislocation, bilateral (HCC)    as infant. was corrected   History of EKG    w palpitations and nonspecific T-wave changes, stress cardiolite 6/08 showed normal LV size in a systolic  function,no maximum effort stress test with moderate work load and good HR achieved,no EKG criteria of ischemia. Did have occassional PVC's   Hypertension    Migraines    followed [facility] in past.    Ocular rosacea    per patient   OSA (obstructive sleep apnea) 10/28/2016   Very mild   PVC (premature ventricular contraction)    Raynaud's disease    followed by Dr Corliss Skains and norvasc has helped   Rosacea    and seborrheic dermatitis followed by Dr Levy Sjogren    Past Surgical History:  Procedure Laterality Date   BREAST BIOPSY Left    BREAST BIOPSY Left    CARDIAC CATHETERIZATION     EYE SURGERY Left 10/2017   cataract extraction    HIP ARTHROPLASTY     HIP SURGERY     LEFT HEART CATHETERIZATION WITH CORONARY ANGIOGRAM N/A 07/28/2014   Procedure: LEFT HEART CATHETERIZATION WITH CORONARY ANGIOGRAM;  Surgeon: Marykay Lex, MD;  Location: [facility] CATH LAB;  Service: Cardiovascular;  Laterality: N/A;   LEG SURGERY     SPLIT NIGHT STUDY  04/10/2016   TOTAL HIP ARTHROPLASTY Right    TOTAL HIP ARTHROPLASTY Left 05/2020   Dr. Lequita Halt  VAGINAL HYSTERECTOMY      No outpatient medications have been marked as taking for the 11/10/23 encounter (Office Visit) with Duke Salvia, MD.    Allergies  Allergen Reactions   Amoxil [Amoxicillin]     Felt like throat was swelling   Penicillins     Swelling of throat   Tape     Electrodes cause Blisters, even with the sensitive kind    Sulfa Antibiotics     Swelling of eyes   Zanaflex [Tizanidine]     halluciations   Polytrim [Polymyxin B-Trimethoprim]     unknown   Voltaren [Diclofenac Sodium] Rash     Review of Systems negative except from HPI and PMH  Physical Exam BP 116/66   Pulse 69   Ht 5\' 3"  (1.6 m)   Wt 147 lb 9.6 oz (67 kg)   SpO2 99%   BMI 26.15 kg/m  Well developed and nourished in no acute distress HENT normal Neck supple with JVP-  flat Venous collateralization is noted on her left  chest Clear Regular rate and rhythm, no murmurs or gallops Abd-soft with active BS No Clubbing cyanosis edema Skin-warm and dry A & Oriented  Grossly normal sensory and motor function  ECG sinus @ 78 15/08/39 Twavy flattening    CrCl cannot be calculated (Patient's most recent lab result is older than the maximum 21 days allowed.).   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  Will actually decrease the metoprolol from 100--50 to see if that has any impact on improving fatigue.  She is on spironolactone, last potassium was 5.0.  Will recheck it today.  Her palpitations continue to be problematic.  Will use a Zio patch and/or her watch to try to clarify the mechanisms of her's palpitations to see if there are other treatment strategies  She does not have any old pictures of her with her chest visible, she will look at home to see if the collateralization left noted on her left chest is old or new.  In the event that it is new she will need some type of vascular assessment of her intrathoracic venous system           Current medicines are reviewed at length with the patient today .  The patient does not  have concerns regarding medicines.

## 2023-11-10 NOTE — Progress Notes (Unsigned)
Enrolled for [employer] to mail a ZIO XT long term holter monitor to the patients address on file.  

## 2023-11-10 NOTE — Patient Instructions (Addendum)
Medication Instructions:  Your physician has recommended you make the following change in your medication:   ** Decrease Metoprolol 100mg  to 1/2 tablet (50mg ) by mouth daily  *If you need a refill on your cardiac medications before your next appointment, please call your pharmacy*   Lab Work: BMET today If you have labs (blood work) drawn today and your tests are completely normal, you will receive your results only by: MyChart Message (if you have MyChart) OR A paper copy in the mail If you have any lab test that is abnormal or we need to change your treatment, we will call you to review the results.   Testing/Procedures: Emily Page- Long Term Monitor Instructions  Your physician has requested you wear a ZIO patch monitor for 14 days.  This is a single patch monitor. [employer] supplies one patch monitor per enrollment. Additional stickers are not available. Please do not apply patch if you will be having a Nuclear Stress Test,  Echocardiogram, Cardiac CT, MRI, or Chest Xray during the period you would be wearing the  monitor. The patch cannot be worn during these tests. You cannot remove and re-apply the  ZIO XT patch monitor.  Your ZIO patch monitor will be mailed 3 day USPS to your address on file. It may take 3-5 days  to receive your monitor after you have been enrolled.  Once you have received your monitor, please review the enclosed instructions. Your monitor  has already been registered assigning a specific monitor serial # to you.  Billing and Patient Assistance Program Information  We have supplied [employer] with any of your insurance information on file for billing purposes. [employer] offers a sliding scale Patient Assistance Program for patients that do not have  insurance, or whose insurance does not completely cover the cost of the ZIO monitor.  You must apply for the Patient Assistance Program to qualify for this discounted rate.  To apply, please call [employer] at  [phone], select option 4, select option 2, ask to apply for  Patient Assistance Program. Emily Page will ask your household income, and how many people  are in your household. They will quote your out-of-pocket cost based on that information.  [employer] will also be able to set up a 76-month, interest-free payment plan if needed.  Applying the monitor   Shave hair from upper left chest.  Hold abrader disc by orange tab. Rub abrader in 40 strokes over the upper left chest as  indicated in your monitor instructions.  Clean area with 4 enclosed alcohol pads. Let dry.  Apply patch as indicated in monitor instructions. Patch will be placed under collarbone on left  side of chest with arrow pointing upward.  Rub patch adhesive wings for 2 minutes. Remove white label marked "1". Remove the white  label marked "2". Rub patch adhesive wings for 2 additional minutes.  While looking in a mirror, press and release button in center of patch. A small green light will  flash 3-4 times. This will be your only indicator that the monitor has been turned on.  Do not shower for the first 24 hours. You may shower after the first 24 hours.  Press the button if you feel a symptom. You will hear a small click. Record Date, Time and  Symptom in the Patient Logbook.  When you are ready to remove the patch, follow instructions on the last 2 pages of Patient  Logbook. Stick patch monitor onto the last page of Patient Logbook.  Place  Patient Logbook in the blue and white box. Use locking tab on box and tape box closed  securely. The blue and white box has prepaid postage on it. Please place it in the mailbox as  soon as possible. Your physician should have your test results approximately 7 days after the  monitor has been mailed back to [facility].  Call [facility] Customer Care at [phone] if you have questions regarding  your ZIO XT patch monitor. Call them immediately if you see an orange light  blinking on your  monitor.  If your monitor falls off in less than 4 days, contact our Monitor department at [phone].  If your monitor becomes loose or falls off after 4 days call [employer] at [phone] for  suggestions on securing your monitor    Follow-Up: At [facility], you and your health needs are our priority.  As part of our continuing mission to provide you with exceptional heart care, we have created designated Provider Care Teams.  These Care Teams include your primary Cardiologist (physician) and Advanced Practice Providers (APPs -  Physician Assistants and Nurse Practitioners) who all work together to provide you with the care you need, when you need it.  We recommend signing up for the patient portal called "MyChart".  Sign up information is provided on this After Visit Summary.  MyChart is used to connect with patients for Virtual Visits (Telemedicine).  Patients are able to view lab/test results, encounter notes, upcoming appointments, etc.  Non-urgent messages can be sent to your provider as well.   To learn more about what you can do with MyChart, go to [email or web address].    Your next appointment:   6 weeks with Dr Graciela Husbands

## 2023-11-11 ENCOUNTER — Encounter: Payer: Self-pay | Admitting: Rheumatology

## 2023-11-11 ENCOUNTER — Ambulatory Visit: Payer: Medicare Other | Attending: Rheumatology | Admitting: Rheumatology

## 2023-11-11 VITALS — BP 122/81 | HR 85 | Resp 15 | Ht 63.0 in | Wt 147.2 lb

## 2023-11-11 DIAGNOSIS — I7300 Raynaud's syndrome without gangrene: Secondary | ICD-10-CM | POA: Diagnosis not present

## 2023-11-11 DIAGNOSIS — Z8639 Personal history of other endocrine, nutritional and metabolic disease: Secondary | ICD-10-CM | POA: Diagnosis not present

## 2023-11-11 DIAGNOSIS — M25562 Pain in left knee: Secondary | ICD-10-CM | POA: Insufficient documentation

## 2023-11-11 DIAGNOSIS — G8929 Other chronic pain: Secondary | ICD-10-CM | POA: Diagnosis not present

## 2023-11-11 DIAGNOSIS — Z8679 Personal history of other diseases of the circulatory system: Secondary | ICD-10-CM | POA: Diagnosis not present

## 2023-11-11 DIAGNOSIS — M3500 Sicca syndrome, unspecified: Secondary | ICD-10-CM | POA: Diagnosis not present

## 2023-11-11 DIAGNOSIS — Z96643 Presence of artificial hip joint, bilateral: Secondary | ICD-10-CM | POA: Insufficient documentation

## 2023-11-11 DIAGNOSIS — M797 Fibromyalgia: Secondary | ICD-10-CM | POA: Insufficient documentation

## 2023-11-11 DIAGNOSIS — H57059 Tonic pupil, unspecified eye: Secondary | ICD-10-CM | POA: Diagnosis not present

## 2023-11-11 DIAGNOSIS — Z8669 Personal history of other diseases of the nervous system and sense organs: Secondary | ICD-10-CM | POA: Insufficient documentation

## 2023-11-11 LAB — BASIC METABOLIC PANEL
BUN/Creatinine Ratio: 17 (ref 12–28)
BUN: 18 mg/dL (ref 8–27)
CO2: 22 mmol/L (ref 20–29)
Calcium: 9.2 mg/dL (ref 8.7–10.3)
Chloride: 98 mmol/L (ref 96–106)
Creatinine, Ser: 1.04 mg/dL — ABNORMAL HIGH (ref 0.57–1.00)
Glucose: 192 mg/dL — ABNORMAL HIGH (ref 70–99)
Potassium: 4.6 mmol/L (ref 3.5–5.2)
Sodium: 138 mmol/L (ref 134–144)
eGFR: 57 mL/min/{1.73_m2} — ABNORMAL LOW (ref >59)

## 2023-11-11 MED ORDER — CYCLOBENZAPRINE HCL 5 MG PO TABS: ORAL_TABLET | ORAL | 2 refills | Status: DC

## 2023-11-11 NOTE — Patient Instructions (Signed)
Exercises for Chronic Knee Pain Chronic knee pain is pain that lasts longer than 3 months. For most people with chronic knee pain, exercise and weight loss is an important part of treatment. Your health care provider may want you to focus on: Making the muscles that support your knee stronger. This can take pressure off your knee and reduce pain. Preventing knee stiffness. How far you can move your knee, keeping it there or making it farther. Losing weight (if this applies) to take pressure off your knee, lower your risk for injury, and make it easier for you to exercise. Your provider will help you make an exercise program that fits your needs and physical abilities. Below are simple, low-impact exercises you can do at home. Ask your provider or physical therapist how often you should do your exercise program and how many times to repeat each exercise. General safety tips  Get your provider's approval before doing any exercises. Start slowly and stop any time you feel pain. Do not exercise if your knee pain is flaring up. Warm up first. Stretching a cold muscle can cause an injury. Do 5-10 minutes of easy movement or light stretching before beginning your exercises. Do 5-10 minutes of low-impact activity (like walking or cycling) before starting strengthening exercises. Contact your provider any time you have pain during or after exercising. Exercise can cause discomfort but should not be painful. It is normal to be a little stiff or sore after exercising. Stretching and range-of-motion exercises Front thigh stretch  Stand up straight and support your body by holding on to a chair or resting one hand on a wall. With your legs straight and close together, bend one knee to lift your heel up toward your butt. Using one hand for support, grab your ankle with your free hand. Pull your foot up closer toward your butt to feel the stretch in front of your thigh. Hold the stretch for 30  seconds. Repeat __________ times. Complete this exercise __________ times a day. Back thigh stretch  Sit on the floor with your back straight and your legs out straight in front of you. Place the palms of your hands on the floor and slide them toward your feet as you bend at the hip. Try to touch your nose to your knees and feel the stretch in the back of your thighs. Hold for 30 seconds. Repeat __________ times. Complete this exercise __________ times a day. Calf stretch  Stand facing a wall. Place the palms of your hands flat against the wall, arms extended, and lean slightly against the wall. Get into a lunge position with one leg bent at the knee and the other leg stretched out straight behind you. Keep both feet facing the wall and increase the bend in your knee while keeping the heel of the other leg flat on the ground. You should feel the stretch in your calf. Hold for 30 seconds. Repeat __________ times. Complete this exercise __________ times a day. Strengthening exercises Straight leg lift  Lie on your back with one knee bent and the other leg out straight. Slowly lift the straight leg without bending the knee. Lift until your foot is about 12 inches (30 cm) off the floor. Hold for 3-5 seconds and slowly lower your leg. Repeat __________ times. Complete this exercise __________ times a day. Single leg dip  Stand between two chairs and put both hands on the backs of the chairs for support. Extend one leg out straight with your body   weight resting on the heel of the standing leg. Slowly bend your standing knee to dip your body to the level that is comfortable for you. Hold for 3-5 seconds. Repeat __________ times. Complete this exercise __________ times a day. Hamstring curls  Stand straight, knees close together, facing the back of a chair. Hold on to the back of a chair with both hands. Keep one leg straight. Bend the other knee while bringing the heel up toward the butt  until the knee is bent at a 90-degree angle (right angle). Hold for 3-5 seconds. Repeat __________ times. Complete this exercise __________ times a day. Wall squat  Stand straight with your back, hips, and head against a wall. Step forward one foot at a time with your back still against the wall. Your feet should be 2 feet (61 cm) from the wall at shoulder width. Keeping your back, hips, and head against the wall, slide down the wall to as close to a sitting position as you can get. Hold for 5-10 seconds, then slowly slide back up. Repeat __________ times. Complete this exercise __________ times a day. Step-ups  Stand in front of a sturdy platform or stool that is about 6 inches (15 cm) high. Slowly step up with your left / right foot, keeping your knee in line with your hip and foot. Do not let your knee bend so far that you cannot see your toes. Hold on to a chair for balance, but do not use it for support. Slowly unlock your knee and lower yourself to the starting position. Repeat __________ times. Complete this exercise __________ times a day. Contact a health care provider if: Your exercises cause pain. Your pain is worse after you exercise. Your pain prevents you from doing your exercises. This information is not intended to replace advice given to you by your health care provider. Make sure you discuss any questions you have with your health care provider. Document Revised: 11/26/2022 Document Reviewed: 11/26/2022 Elsevier Patient Education  2024 [employer].  

## 2023-11-15 ENCOUNTER — Encounter: Payer: Self-pay | Admitting: Internal Medicine

## 2023-11-15 DIAGNOSIS — I951 Orthostatic hypotension: Secondary | ICD-10-CM

## 2023-11-15 DIAGNOSIS — R002 Palpitations: Secondary | ICD-10-CM

## 2023-12-08 ENCOUNTER — Encounter: Payer: Self-pay | Admitting: Internal Medicine

## 2023-12-08 DIAGNOSIS — I951 Orthostatic hypotension: Secondary | ICD-10-CM | POA: Diagnosis not present

## 2023-12-08 DIAGNOSIS — R002 Palpitations: Secondary | ICD-10-CM

## 2023-12-16 ENCOUNTER — Other Ambulatory Visit: Payer: Self-pay | Admitting: Internal Medicine

## 2023-12-18 ENCOUNTER — Encounter: Payer: Self-pay | Admitting: Internal Medicine

## 2023-12-18 ENCOUNTER — Ambulatory Visit: Payer: Medicare Other | Admitting: Internal Medicine

## 2023-12-18 VITALS — BP 138/80 | HR 79 | Temp 97.8°F | Ht 63.0 in | Wt 149.0 lb

## 2023-12-18 DIAGNOSIS — H57059 Tonic pupil, unspecified eye: Secondary | ICD-10-CM | POA: Diagnosis not present

## 2023-12-18 DIAGNOSIS — M3500 Sicca syndrome, unspecified: Secondary | ICD-10-CM | POA: Diagnosis not present

## 2023-12-18 DIAGNOSIS — R739 Hyperglycemia, unspecified: Secondary | ICD-10-CM

## 2023-12-18 DIAGNOSIS — E559 Vitamin D deficiency, unspecified: Secondary | ICD-10-CM | POA: Diagnosis not present

## 2023-12-18 DIAGNOSIS — K219 Gastro-esophageal reflux disease without esophagitis: Secondary | ICD-10-CM

## 2023-12-18 DIAGNOSIS — N951 Menopausal and female climacteric states: Secondary | ICD-10-CM | POA: Diagnosis not present

## 2023-12-18 DIAGNOSIS — R5383 Other fatigue: Secondary | ICD-10-CM

## 2023-12-18 DIAGNOSIS — G901 Familial dysautonomia [Riley-Day]: Secondary | ICD-10-CM

## 2023-12-18 DIAGNOSIS — I10 Essential (primary) hypertension: Secondary | ICD-10-CM | POA: Diagnosis not present

## 2023-12-18 DIAGNOSIS — I429 Cardiomyopathy, unspecified: Secondary | ICD-10-CM

## 2023-12-18 LAB — COMPREHENSIVE METABOLIC PANEL
ALT: 10 U/L (ref 0–35)
AST: 18 U/L (ref 0–37)
Albumin: 4 g/dL (ref 3.5–5.2)
Alkaline Phosphatase: 62 U/L (ref 39–117)
BUN: 15 mg/dL (ref 6–23)
CO2: 27 meq/L (ref 19–32)
Calcium: 9.2 mg/dL (ref 8.4–10.5)
Chloride: 102 meq/L (ref 96–112)
Creatinine, Ser: 1 mg/dL (ref 0.40–1.20)
GFR: 56.03 mL/min — ABNORMAL LOW (ref >60.00)
Glucose, Bld: 113 mg/dL — ABNORMAL HIGH (ref 70–99)
Potassium: 4.7 meq/L (ref 3.5–5.1)
Sodium: 136 meq/L (ref 135–145)
Total Bilirubin: 0.4 mg/dL (ref 0.2–1.2)
Total Protein: 6.6 g/dL (ref 6.0–8.3)

## 2023-12-18 LAB — VITAMIN D 25 HYDROXY (VIT D DEFICIENCY, FRACTURES): VITD: 55.54 ng/mL (ref 30.00–100.00)

## 2023-12-18 LAB — CBC
HCT: 40.5 % (ref 36.0–46.0)
Hemoglobin: 13.2 g/dL (ref 12.0–15.0)
MCHC: 32.6 g/dL (ref 30.0–36.0)
MCV: 97 fL (ref 78.0–100.0)
Platelets: 317 10*3/uL (ref 150.0–400.0)
RBC: 4.18 Mil/uL (ref 3.87–5.11)
RDW: 14 % (ref 11.5–15.5)
WBC: 9 10*3/uL (ref 4.0–10.5)

## 2023-12-18 LAB — LIPID PANEL
Cholesterol: 210 mg/dL — ABNORMAL HIGH (ref 0–200)
HDL: 92.9 mg/dL (ref >39.00)
LDL Cholesterol: 78 mg/dL (ref 0–99)
NonHDL: 117.55
Total CHOL/HDL Ratio: 2
Triglycerides: 196 mg/dL — ABNORMAL HIGH (ref 0.0–149.0)
VLDL: 39.2 mg/dL (ref 0.0–40.0)

## 2023-12-18 LAB — TSH: TSH: 1.98 u[IU]/mL (ref 0.35–5.50)

## 2023-12-18 LAB — VITAMIN B12: Vitamin B-12: 1500 pg/mL — ABNORMAL HIGH (ref 211–911)

## 2023-12-18 LAB — HEMOGLOBIN A1C: Hgb A1c MFr Bld: 5.7 % (ref 4.6–6.5)

## 2023-12-18 NOTE — Patient Instructions (Signed)
We will check the labs today.  Try lidocaine gel on the ear and on the thigh

## 2023-12-18 NOTE — Progress Notes (Signed)
   Subjective:   Patient ID: Emily Page, female    DOB: 1950-12-06, 73 y.o.   MRN: [id number]  HPI The patient is a 73 YO female coming in for follow up and medical management see A/P for details.   Review of Systems  Constitutional: Negative.   HENT:  Positive for ear pain.   Eyes: Negative.   Respiratory:  Negative for cough, chest tightness and shortness of breath.   Cardiovascular:  Negative for chest pain, palpitations and leg swelling.  Gastrointestinal:  Negative for abdominal distention, abdominal pain, constipation, diarrhea, nausea and vomiting.  Musculoskeletal: Negative.   Skin: Negative.   Neurological: Negative.   Psychiatric/Behavioral: Negative.      Objective:  Physical Exam Constitutional:      Appearance: She is well-developed.  HENT:     Head: Normocephalic and atraumatic.  Cardiovascular:     Rate and Rhythm: Normal rate and regular rhythm.  Pulmonary:     Effort: Pulmonary effort is normal. No respiratory distress.     Breath sounds: Normal breath sounds. No wheezing or rales.  Abdominal:     General: Bowel sounds are normal. There is no distension.     Palpations: Abdomen is soft.     Tenderness: There is no abdominal tenderness. There is no rebound.  Musculoskeletal:     Cervical back: Normal range of motion.  Skin:    General: Skin is warm and dry.  Neurological:     Mental Status: She is alert and oriented to person, place, and time.     Coordination: Coordination normal.     Vitals:   12/18/23 1044  BP: 138/80  Pulse: 79  Temp: 97.8 F (36.6 C)  TempSrc: Oral  SpO2: 99%  Weight: 149 lb (67.6 kg)  Height: 5\' 3"  (1.6 m)    Assessment & Plan:

## 2023-12-19 MED ORDER — TRIAMCINOLONE ACETONIDE 0.1 % EX CREA: 1.0000 | TOPICAL_CREAM | Freq: Two times a day (BID) | CUTANEOUS | 0 refills | Status: AC

## 2023-12-19 NOTE — Assessment & Plan Note (Signed)
Checking CBC as she is having some episodes of lightheadedness recently. Overall this is stable but she does have symptoms associated with it.

## 2023-12-19 NOTE — Assessment & Plan Note (Signed)
Taking estradiol 2 mg daily and symptoms are controlled. She prefers to continue and understands risk/benefit.

## 2023-12-19 NOTE — Assessment & Plan Note (Signed)
BP at goal on amlodipine 2.5 mg daily, lisinopril 40 mg daily and cardiology recently changed metoprolol to 50 mg daily and spironolactone 12.5 mg daily. Checking CMP and adjust as needed.

## 2023-12-19 NOTE — Assessment & Plan Note (Signed)
Taking nexium 40 mg daily and controlled. Will continue.

## 2023-12-19 NOTE — Assessment & Plan Note (Signed)
Checking HgA1c and adjust as needed.

## 2023-12-19 NOTE — Assessment & Plan Note (Signed)
Is taking metoprolol 50 mg daily and spironolactone and lisinopril. No clear flare today. Continue and checking CMP and adjust as needed.

## 2023-12-19 NOTE — Assessment & Plan Note (Signed)
Affecting eyes and follows up with optho regularly.

## 2023-12-19 NOTE — Assessment & Plan Note (Signed)
Using otc to help with dryness and this is adequate at this time.

## 2023-12-22 ENCOUNTER — Encounter: Payer: Self-pay | Admitting: Internal Medicine

## 2023-12-23 ENCOUNTER — Ambulatory Visit: Payer: Medicare Other | Admitting: Physician Assistant

## 2023-12-24 ENCOUNTER — Ambulatory Visit: Payer: Medicare Other | Attending: Internal Medicine

## 2023-12-24 DIAGNOSIS — I951 Orthostatic hypotension: Secondary | ICD-10-CM

## 2023-12-24 DIAGNOSIS — R002 Palpitations: Secondary | ICD-10-CM

## 2024-01-15 NOTE — Progress Notes (Deleted)
 Cardiology Office Note:  .   Date:  01/15/2024  ID:  Emily Page, DOB 02-07-1951, MRN [id number] PCP: Myrlene Broker, MD  [facility] Providers Cardiologist:  None {  History of Present Illness: .   Emily Page is a 73 y.o. female w/PMHx of HTN, PVCs stereotypical spells suggestive of dysautonomia   Saw Dr. Graciela Husbands 11/10/23,  DATE TEST EF    8/15 Echo  45-50 %    9/15 Cath   Normal CA  11/16 cMRI 47 % No LGE  6/18 Echo 50-55 %    11/19 cMRI 59 %    Reports:  LH with recorded rhythms showing normal sinus during spells of LH and weakness (presumably by historical monitoring He discusses: Reducing her BB for symptoms of fatigue Ongoing  palpitations with plans for monitoring F/u on K+ of 5.0 Noted chest wall venous collateralization that may need w/u (not felt to be new perhaps by the pat?), if new, recommended that she will need some type of vascular assessment of her intrathoracic venous system   Dr. Graciela Husbands has not yet read the monitor Looks like she perhaps had difficulty wearing it 2/2 skin sensitivity   Today's visit is scheduled as a 6 week f/u  No arrhythmias Low burden of ectopy Symptom tracings ST, SR, PVCs  ROS:   *** symptoms *** hydration *** triggers *** better on lower BB?   Studies Reviewed: Marland Kitchen    EKG not done today  10/19/2018, c.MRI IMPRESSION: 1.  Normal LV size and systolic function, EF 59%. 2. Normal RV size and systolic function, EF 64%. No evidence for ARVC. 3. No myocardial LGE, so no definitive evidence for prior MI, infiltrative disease, or myocarditis.   05/12/2017: TTE Study Conclusions  - Left ventricle: The cavity size was mildly dilated. Systolic    function was normal. The estimated ejection fraction was in the    range of 50% to 55%. Wall motion was normal; there were no    regional wall motion abnormalities. Features are consistent with    a pseudonormal left ventricular filling pattern, with  concomitant    abnormal relaxation and increased filling pressure (grade 2    diastolic dysfunction).  - Aortic valve: Trileaflet; normal thickness, mildly calcified    leaflets.  - Mitral valve: There was mild regurgitation.  - Tricuspid valve: There was mild regurgitation.  - Pulmonic valve: There was mild regurgitation.   Impressions: - Low normal LVF with EF 50-55% with mild MR, TR and PR. Compared    to prior study - LVF appears improved.    Risk Assessment/Calculations:    Physical Exam:   VS:  There were no vitals taken for this visit.   Wt Readings from Last 3 Encounters:  12/18/23 149 lb (67.6 kg)  11/11/23 147 lb 3.2 oz (66.8 kg)  11/10/23 147 lb 9.6 oz (67 kg)    GEN: Well nourished, well developed in no acute distress NECK: No JVD; No carotid bruits CARDIAC: ***RRR, no murmurs, rubs, gallops RESPIRATORY:  *** CTA b/l without rales, wheezing or rhonchi  ABDOMEN: Soft, non-tender, non-distended EXTREMITIES:  No edema; No deformity   PPM/ICD/ILR site: *** is stable, no thinning, fluctuation, tethering  ASSESSMENT AND PLAN: .    *** AFib *** Secondary hypercoagulable state 2/2 AFib     {Are you ordering a CV Procedure (e.g. stress test, cath, DCCV, TEE, etc)?   Press F2        :[id number]}  Dispo: ***  Signed, Sheilah Pigeon, PA-C

## 2024-01-16 ENCOUNTER — Ambulatory Visit: Payer: Medicare Other | Admitting: Physician Assistant

## 2024-02-02 ENCOUNTER — Encounter: Payer: Self-pay | Admitting: Internal Medicine

## 2024-02-03 NOTE — Progress Notes (Unsigned)
 Cardiology Office Note:  .   Date:  02/03/2024  ID:  Emily Page, DOB 04-02-1951, MRN [id number] PCP: Myrlene Broker, MD  [facility] Providers Cardiologist:  Dr. Graciela Husbands {  History of Present Illness: .   Emily Page is a 73 y.o. female w/PMHx of HTN, PVCs stereotypical spells suggestive of dysautonomia   Saw Dr. Graciela Husbands 11/10/23,  DATE TEST EF    8/15 Echo  45-50 %    9/15 Cath   Normal CA  11/16 cMRI 47 % No LGE  6/18 Echo 50-55 %    11/19 cMRI 59 %    Reports:  LH with recorded rhythms showing normal sinus during spells of LH and weakness (presumably by historical monitoring He discusses: Reducing her BB for symptoms of fatigue Ongoing  palpitations with plans for monitoring F/u on K+ of 5.0 Noted chest wall venous collateralization that may need w/u (not felt to be new perhaps by the pat?), if new, recommended that she will need some type of vascular assessment of her intrathoracic venous system   Looks like she perhaps had difficulty wearing it 2/2 skin sensitivity   Today's visit is scheduled as a 6 week f/u  No arrhythmias Low burden of ectopy Symptom tracings ST, SR, PVCs  ROS:   Her symptoms continue Today is a good day They do seem to wax/wane  When her HRs start to gt into the 80's she is aware of it, makes her feel uncomfortable, weak, winded. She will often have to sit or lay down and put her feet up for several minutes to start to feel better  Hot environments are worse Some days like recently, symptoms just wont let up and has to go lay down several times in the day  She feel like she is sipping on water throughout the day, but probably doesn't get towards 60oz, this much makes her feel bloated. She has reynaud's and off the amlodipine made these symptoms worse and resulted in very high BP, so very reluctant to consider retry off it  Support stockings have proven too difficult to get on, thigh sleeves uncomfortable or even  painful  Reports painful blisters from the monitor adhesive    Studies Reviewed: Marland Kitchen    EKG not done today   Jan 2025, monitor Findings HR  avg 76  Min 56-Max 140  PVCs Rare, less than 1%   PACs Rare, less than 1%   No SVT  episodes;  Symptoms:             Shortness of breath sinus w rate PVC>>              fatigue>> sinus with rare PVC Triggered: none          Conclusions: No significant arrhtymia noted with her albeit infrequent symptoms    10/19/2018, c.MRI IMPRESSION: 1.  Normal LV size and systolic function, EF 59%. 2. Normal RV size and systolic function, EF 64%. No evidence for ARVC. 3. No myocardial LGE, so no definitive evidence for prior MI, infiltrative disease, or myocarditis.   05/12/2017: TTE Study Conclusions  - Left ventricle: The cavity size was mildly dilated. Systolic    function was normal. The estimated ejection fraction was in the    range of 50% to 55%. Wall motion was normal; there were no    regional wall motion abnormalities. Features are consistent with    a pseudonormal left ventricular filling pattern, with concomitant  abnormal relaxation and increased filling pressure (grade 2    diastolic dysfunction).  - Aortic valve: Trileaflet; normal thickness, mildly calcified    leaflets.  - Mitral valve: There was mild regurgitation.  - Tricuspid valve: There was mild regurgitation.  - Pulmonic valve: There was mild regurgitation.   Impressions: - Low normal LVF with EF 50-55% with mild MR, TR and PR. Compared    to prior study - LVF appears improved.    Risk Assessment/Calculations:    Physical Exam:   VS:  There were no vitals taken for this visit.   Wt Readings from Last 3 Encounters:  12/18/23 149 lb (67.6 kg)  11/11/23 147 lb 3.2 oz (66.8 kg)  11/10/23 147 lb 9.6 oz (67 kg)    GEN: Well nourished, well developed in no acute distress NECK: No JVD; No carotid bruits CARDIAC: RRR, no murmurs, rubs, gallops RESPIRATORY:  CTA b/l  without rales, wheezing or rhonchi  ABDOMEN: Soft, non-tender, non-distended EXTREMITIES: No edema; No deformity   ASSESSMENT AND PLAN: .    Lightheaded Dysautonomia (?) Palpitations PVCs Rare on her monitor Benign monitor findings   Advise adequate hydration Supportive wear as tolerated Advoid hot environments Symptom recognition     Dispo: back in 6 mo, sooner if needed  Signed, Sheilah Pigeon, PA-C

## 2024-02-05 ENCOUNTER — Encounter: Payer: Self-pay | Admitting: Physician Assistant

## 2024-02-05 ENCOUNTER — Ambulatory Visit: Payer: Medicare Other | Attending: Physician Assistant | Admitting: Physician Assistant

## 2024-02-05 VITALS — BP 118/64 | HR 73 | Ht 62.5 in | Wt 149.6 lb

## 2024-02-05 DIAGNOSIS — I493 Ventricular premature depolarization: Secondary | ICD-10-CM | POA: Diagnosis present

## 2024-02-05 DIAGNOSIS — G901 Familial dysautonomia [Riley-Day]: Secondary | ICD-10-CM

## 2024-02-05 NOTE — Patient Instructions (Signed)
 Medication Instructions:   Your physician recommends that you continue on your current medications as directed. Please refer to the Current Medication list given to you today.   *If you need a refill on your cardiac medications before your next appointment, please call your pharmacy*    Lab Work: NONE ORDERED  TODAY    If you have labs (blood work) drawn today and your tests are completely normal, you will receive your results only by: MyChart Message (if you have MyChart) OR A paper copy in the mail If you have any lab test that is abnormal or we need to change your treatment, we will call you to review the results.   Testing/Procedures: NONE ORDERED  TODAY      Follow-Up: At [facility], you and your health needs are our priority.  As part of our continuing mission to provide you with exceptional heart care, we have created designated Provider Care Teams.  These Care Teams include your primary Cardiologist (physician) and Advanced Practice Providers (APPs -  Physician Assistants and Nurse Practitioners) who all work together to provide you with the care you need, when you need it.  We recommend signing up for the patient portal called "MyChart".  Sign up information is provided on this After Visit Summary.  MyChart is used to connect with patients for Virtual Visits (Telemedicine).  Patients are able to view lab/test results, encounter notes, upcoming appointments, etc.  Non-urgent messages can be sent to your provider as well.   To learn more about what you can do with MyChart, go to [email or web address].    Your next appointment:     6 month(s)   Provider:    You may see Dr. Graciela Husbands or one of the following Advanced Practice Providers on your designated Care Team:   Francis Dowse, Charlott Holler [address] [place], [place] Canary Brim, NP    Other Instructions

## 2024-02-14 ENCOUNTER — Other Ambulatory Visit: Payer: Self-pay | Admitting: Rheumatology

## 2024-02-14 DIAGNOSIS — M797 Fibromyalgia: Secondary | ICD-10-CM

## 2024-02-16 NOTE — Telephone Encounter (Signed)
 Last Fill: 11/11/2023  Next Visit: 05/11/2024  Last Visit: 11/08/2023  Dx: Fibromyalgia   Current Dose per office note on 11/11/2023: Flexeril 5 mg half tablet by mouth at bedtime as needed.   Okay to refill Flexeril?

## 2024-02-20 ENCOUNTER — Ambulatory Visit: Payer: Medicare Other

## 2024-02-20 VITALS — Ht 62.5 in | Wt 149.0 lb

## 2024-02-20 DIAGNOSIS — Z780 Asymptomatic menopausal state: Secondary | ICD-10-CM

## 2024-02-20 DIAGNOSIS — Z0000 Encounter for general adult medical examination without abnormal findings: Secondary | ICD-10-CM | POA: Diagnosis not present

## 2024-02-20 DIAGNOSIS — Z1211 Encounter for screening for malignant neoplasm of colon: Secondary | ICD-10-CM

## 2024-02-20 NOTE — Patient Instructions (Addendum)
 Emily Page , Thank you for taking time to come for your Medicare Wellness Visit. I appreciate your ongoing commitment to your health goals. Please review the following plan we discussed and let me know if I can assist you in the future.   Referrals/Orders/Follow-Ups/Clinician Recommendations: Aim for 30 minutes of exercise or brisk walking, 6-8 glasses of water, and 5 servings of fruits and vegetables each day.   This is a list of the screening recommended for you and due dates:  Health Maintenance  Topic Date Due   Hepatitis C Screening  Never done   DTaP/Tdap/Td vaccine (1 - Tdap) Never done   DEXA scan (bone density measurement)  Never done   COVID-19 Vaccine (4 - 2024-25 season) 07/27/2023   Colon Cancer Screening  06/29/2024*   Medicare Annual Wellness Visit  02/19/2025   Mammogram  03/05/2025   Pneumonia Vaccine  Completed   Flu Shot  Completed   HPV Vaccine  Aged Out   Zoster (Shingles) Vaccine  Discontinued  *Topic was postponed. The date shown is not the original due date.    Advanced directives: (Copy Requested) Please bring a copy of your health care power of attorney and living will to the office to be added to your chart at your convenience. You can mail to [facility] [address] [place], [place] [address] or email to [email or web address]  Next Medicare Annual Wellness Visit scheduled for next year: Yes

## 2024-02-20 NOTE — Progress Notes (Signed)
 Subjective:  Please attest and cosign this visit due to patients primary care provider not being in the office at the time the visit was completed.  (Pt of Emily Page)   Emily Page is a 73 y.o. who presents for a Medicare Wellness preventive visit.  Visit Complete: Virtual I connected with  Emily Page on 02/20/24 by a video and audio enabled telemedicine application and verified that I am speaking with the correct Page using two identifiers.  Patient Location: Home  Provider Location: Office/Clinic  I discussed the limitations of evaluation and management by telemedicine. The patient expressed understanding and agreed to proceed.  Vital Signs: Because this visit was a virtual/telehealth visit, some criteria may be missing or patient reported. Any vitals not documented were not able to be obtained and vitals that have been documented are patient reported.  Persons Participating in Visit: Patient.  AWV Questionnaire: No: Patient Medicare AWV questionnaire was not completed prior to this visit.  Cardiac Risk Factors include: advanced age (>75men, >60 women);hypertension     Objective:    Today's Vitals   02/20/24 1344  Weight: 149 lb (67.6 kg)  Height: 5' 2.5" (1.588 m)   Body mass index is 26.82 kg/m.     02/20/2024    1:43 PM 10/29/2022    3:02 PM 04/10/2016    9:00 PM 04/17/2015   10:31 AM 07/28/2014   10:05 AM  Advanced Directives  Does Patient Have a Medical Advance Directive? Yes Yes Yes Yes No  Type of Estate agent of [place];Living will Healthcare Power of [place];Living will Healthcare Power of [place];Living will Healthcare Power of [place];Living will   Does patient want to make changes to medical advance directive?  No - Patient declined No - Patient declined No - Patient declined   Copy of Healthcare Power of Attorney in Chart? No - copy requested No - copy requested Yes No - copy requested   Would patient like  information on creating a medical advance directive?     No - patient declined information    Current Medications (verified) Outpatient Encounter Medications as of 02/20/2024  Medication Sig   amLODipine (NORVASC) 2.5 MG tablet Take 1 tablet (2.5 mg total) by mouth daily.   CALCIUM PO Take 600 mg by mouth daily.    Carboxymethylcellulose Sodium (ARTIFICIAL TEARS OP) Apply to eye 2 (two) times daily.   cholecalciferol (VITAMIN D3) 25 MCG (1000 UNIT) tablet Take 1,000 Units by mouth daily.   clindamycin (CLEOCIN) 150 MG capsule Take 150 mg by mouth 4 (four) times daily. As needed for dental procedures.   cyclobenzaprine (FLEXERIL) 5 MG tablet TAKE 1/2 TABLET BY MOUTH EVERY DAY AT BEDTIME   DENTA 5000 PLUS 1.1 % CREA dental cream See admin instructions.   esomeprazole (NEXIUM) 40 MG capsule Take 40 mg by mouth daily at 12 noon.   estradiol (ESTRACE) 2 MG tablet Take 1 tablet (2 mg total) by mouth daily.   fluticasone (FLONASE) 50 MCG/ACT nasal spray    ketoconazole (NIZORAL) 2 % cream Apply 1 Application topically daily.   ketoconazole (NIZORAL) 2 % shampoo Apply 1 Application topically daily.   lisinopril (ZESTRIL) 40 MG tablet Take 1 tablet (40 mg total) by mouth daily.   Magnesium Malate 1250 (141.7 Mg) MG TABS Take 2 tablets by mouth daily.   metoprolol succinate (TOPROL-XL) 100 MG 24 hr tablet TAKE 1 TABLET BY MOUTH EVERY DAY   metroNIDAZOLE (METROCREAM) 0.75 % cream Apply 1 Application  topically 2 (two) times daily.   nystatin-triamcinolone ointment (MYCOLOG) Apply 1 Application topically daily as needed (to rash).   Probiotic Product (PROBIOTIC PO) Take by mouth daily.   spironolactone (ALDACTONE) 25 MG tablet Take 0.5 tablets (12.5 mg total) by mouth daily.   triamcinolone cream (KENALOG) 0.1 % Apply 1 Application topically 2 (two) times daily.   No facility-administered encounter medications on file as of 02/20/2024.    Allergies (verified) Amoxil [amoxicillin], Penicillins, Tape,  Sulfa antibiotics, Zanaflex [tizanidine], Polytrim [polymyxin b-trimethoprim], Wound dressing adhesive, and Voltaren [diclofenac sodium]   History: Past Medical History:  Diagnosis Date   Allergic rhinitis    followed by Emily Emily Page in the past   Fibromyalgia    followed by Emily Emily Page   Foot pain, right    w intermittent swelling felt to be related to how she is walking on her foot. evaluated by Emily Emily Page (podiatrist)   GERD (gastroesophageal reflux disease)    Followed by Emily Emily Page in the past   Hip dislocation, bilateral (HCC)    as infant. was corrected   History of EKG    w palpitations and nonspecific T-wave changes, stress cardiolite 6/08 showed normal LV size in a systolic function,no maximum effort stress test with moderate work load and good HR achieved,no EKG criteria of ischemia. Did have occassional PVC's   Hypertension    Migraines    followed [facility] in past.    Ocular rosacea    per patient   OSA (obstructive sleep apnea) 10/28/2016   Very mild   PVC (premature ventricular contraction)    Raynaud's disease    followed by Emily Page and norvasc has helped   Rosacea    and seborrheic dermatitis followed by Emily Emily Page   Past Surgical History:  Procedure Laterality Date   BREAST BIOPSY Left    BREAST BIOPSY Left    CARDIAC CATHETERIZATION     EYE SURGERY Left 10/2017   cataract extraction    HIP ARTHROPLASTY     HIP SURGERY     LEFT HEART CATHETERIZATION WITH CORONARY ANGIOGRAM N/A 07/28/2014   Procedure: LEFT HEART CATHETERIZATION WITH CORONARY ANGIOGRAM;  Surgeon: Emily Lex, MD;  Location[facility] C[facility] LAB;  Service: Cardiovascular;  Laterality: N/A;   LEG SURGERY     SPLIT NIGHT STUDY  04/10/2016   TOTAL HIP ARTHROPLASTY Right    TOTAL HIP ARTHROPLASTY Left 05/2020   Emily. Lequita Page   VAGINAL HYSTERECTOMY     Family History  Problem Relation Age of Onset   Hypertension Mother    Heart attack Father    Hypertension Father     Hyperlipidemia Father    Cancer Daughter        stage 3 colorectal cancer   Social History   Socioeconomic History   Marital status: Married    Spouse name: Emily Needle   Number of children: Not on file   Years of education: Not on file   Highest education level: Bachelor's degree (e.g., BA, AB, BS)  Occupational History   Not on file  Tobacco Use   Smoking status: Never    Passive exposure: Never   Smokeless tobacco: Never   Tobacco comments:    never used tobacco  Vaping Use   Vaping status: Never Used  Substance and Sexual Activity   Alcohol use: Yes    Alcohol/week: 1.0 standard drink of alcohol    Types: 1 Glasses of wine per week    Comment: per day  Drug use: Never   Sexual activity: Yes  Other Topics Concern   Not on file  Social History Narrative   Not on file   Social Drivers of Health   Financial Resource Strain: Low Risk  (02/20/2024)   Overall Financial Resource Strain (CARDIA)    Difficulty of Paying Living Expenses: Not hard at all  Food Insecurity: No Food Insecurity (02/20/2024)   Hunger Vital Sign    Worried About Running Out of Food in the Last Year: Never true    Ran Out of Food in the Last Year: Never true  Transportation Needs: No Transportation Needs (02/20/2024)   PRAPARE - Administrator, Civil Service (Medical): No    Lack of Transportation (Non-Medical): No  Physical Activity: Insufficiently Active (02/20/2024)   Exercise Vital Sign    Days of Exercise per Week: 2 days    Minutes of Exercise per Session: 30 min  Stress: No Stress Concern Present (02/20/2024)   [employer] of Occupational Health - Occupational Stress Questionnaire    Feeling of Stress : Not at all  Social Connections: Moderately Isolated (02/20/2024)   Social Connection and Isolation Panel [NHANES]    Frequency of Communication with Friends and Family: More than three times a week    Frequency of Social Gatherings with Friends and Family: More than three times  a week    Attends Religious Services: Never    Database administrator or Organizations: No    Attends Engineer, structural: Never    Marital Status: Married    Tobacco Counseling Counseling given: No Tobacco comments: never used tobacco    Clinical Intake:  Pre-visit preparation completed: Yes  Pain : No/denies pain     BMI - recorded: 26.82 Nutritional Status: BMI 25 -29 Overweight Nutritional Risks: None Diabetes: No  Lab Results  Component Value Date   HGBA1C 5.7 12/18/2023   HGBA1C 5.6 06/18/2022     How often do you need to have someone help you when you read instructions, pamphlets, or other written materials from your doctor or pharmacy?: 1 - Never  Interpreter Needed?: No  Information entered by :: Hassell Halim, CMA   Activities of Daily Living     02/20/2024    1:47 PM  In your present state of health, do you have any difficulty performing the following activities:  Hearing? 0  Vision? 0  Difficulty concentrating or making decisions? 0  Walking or climbing stairs? 0  Dressing or bathing? 0  Doing errands, shopping? 0  Preparing Food and eating ? N  Using the Toilet? N  In the past six months, have you accidently leaked urine? N  Do you have problems with loss of bowel control? N  Managing your Medications? N  Managing your Finances? N  Housekeeping or managing your Housekeeping? N    Patient Care Team: Myrlene Broker, MD as PCP - General (Internal Medicine) Juluis Rainier, MD (Inactive) as Referring Physician (Family Medicine) Duke Salvia, MD as Consulting Physician (Cardiology) Shon Millet, MD as Consulting Physician (Ophthalmology)  Indicate any recent Medical Services you may have received from other than [facility] providers in the past year (date may be approximate).     Assessment:   This is a routine wellness examination for Emily Page.  Hearing/Vision screen Hearing Screening - Comments:: Denies hearing  difficulties   Vision Screening - Comments:: Wears rx glasses - up to date with routine eye exams with  Emily Sherrine Maples  Goals Addressed               This Visit's Progress     Patient Stated (pt-stated)        Patient stated she wants to stay heathy.       Depression Screen     02/20/2024    1:51 PM 02/20/2024    1:50 PM 06/19/2023   10:50 AM 12/19/2022   10:56 AM 10/29/2022    2:58 PM 06/18/2022   10:54 AM 12/18/2021    2:13 PM  PHQ 2/9 Scores  PHQ - 2 Score 0 0 0 0 0 0 0  PHQ- 9 Score 0 0 5   5     Fall Risk     02/20/2024    1:47 PM 12/18/2023   10:52 AM 06/30/2023    2:05 PM 06/19/2023   10:50 AM 12/19/2022   10:55 AM  Fall Risk   Falls in the past year? 0 0 0 0 0  Number falls in past yr: 0 0 0 0 0  Injury with Fall? 0 0 0 0 0  Risk for fall due to : No Fall Risks   No Fall Risks No Fall Risks  Follow up Falls prevention discussed;Falls evaluation completed Falls evaluation completed Falls evaluation completed Falls evaluation completed Falls evaluation completed    MEDICARE RISK AT HOME:  Medicare Risk at Home Any stairs in or around the home?: Yes (outside) If so, are there any without handrails?: No Home free of loose throw rugs in walkways, pet beds, electrical cords, etc?: Yes Adequate lighting in your home to reduce risk of falls?: Yes Life alert?: No Use of a cane, walker or w/c?: No Grab bars in the bathroom?: Yes Shower chair or bench in shower?: No Elevated toilet seat or a handicapped toilet?: Yes  TIMED UP AND GO:  Was the test performed?  No  Cognitive Function: 6CIT completed        02/20/2024    1:52 PM 10/29/2022    3:03 PM  6CIT Screen  What Year? 0 points 0 points  What month? 0 points 0 points  What time? 0 points 0 points  Count back from 20 0 points 0 points  Months in reverse 0 points 0 points  Repeat phrase 0 points 0 points  Total Score 0 points 0 points    Immunizations Immunization History  Administered Date(s) Administered    Fluad Quad(high Dose 65+) 08/28/2022   Fluad Trivalent(High Dose 65+) 09/16/2023   Influenza Split 09/07/2012   Influenza, High Dose Seasonal PF 08/29/2020   Influenza,inj,Quad PF,6+ Mos 08/24/2015, 09/24/2016, 09/09/2017, 09/24/2019   Influenza,inj,quad, With Preservative 08/16/2015, 08/20/2018   Influenza-Unspecified 09/11/2019   PFIZER(Purple Top)SARS-COV-2 Vaccination 01/03/2020, 01/28/2020   Pfizer Covid-19 Vaccine Bivalent Booster 74yrs & up 08/28/2022   Pneumococcal Conjugate-13 01/04/2016   Pneumococcal Polysaccharide-23 09/09/2017   Rsv, Bivalent, Protein Subunit Rsvpref,pf Verdis Frederickson) 10/08/2022    Screening Tests Health Maintenance  Topic Date Due   Hepatitis C Screening  Never done   DTaP/Tdap/Td (1 - Tdap) Never done   DEXA SCAN  Never done   COVID-19 Vaccine (4 - 2024-25 season) 07/27/2023   Colonoscopy  06/29/2024 (Originally 10/25/1995)   Medicare Annual Wellness (AWV)  02/19/2025   MAMMOGRAM  03/05/2025   Pneumonia Vaccine 60+ Years old  Completed   INFLUENZA VACCINE  Completed   HPV VACCINES  Aged Out   Zoster Vaccines- Shingrix  Discontinued    Health Maintenance  Health Maintenance Due  Topic Date Due   Hepatitis C Screening  Never done   DTaP/Tdap/Td (1 - Tdap) Never done   DEXA SCAN  Never done   COVID-19 Vaccine (4 - 2024-25 season) 07/27/2023   Health Maintenance Items Addressed: 02/20/2024 DEXA ordered, Referral sent to GI for colonoscopy  Additional Screening:  Vision Screening: Recommended annual ophthalmology exams for early detection of glaucoma and other disorders of the eye.  Dental Screening: Recommended annual dental exams for proper oral hygiene  Community Resource Referral / Chronic Care Management: CRR required this visit?  No   CCM required this visit?  No     Plan:     I have personally reviewed and noted the following in the patient's chart:   Medical and social history Use of alcohol, tobacco or illicit drugs   Current medications and supplements including opioid prescriptions. Patient is not currently taking opioid prescriptions. Functional ability and status Nutritional status Physical activity Advanced directives List of other physicians Hospitalizations, surgeries, and ER visits in previous 12 months Vitals Screenings to include cognitive, depression, and falls Referrals and appointments  In addition, I have reviewed and discussed with patient certain preventive protocols, quality metrics, and best practice recommendations. A written personalized care plan for preventive services as well as general preventive health recommendations were provided to patient.     Darreld Mclean, CMA   02/20/2024   After Visit Summary: Due to this being a Video visit, the patient will review information viz MyChart.  Notes: Please refer to Routing Comments.

## 2024-03-03 ENCOUNTER — Ambulatory Visit (INDEPENDENT_AMBULATORY_CARE_PROVIDER_SITE_OTHER)
Admission: RE | Admit: 2024-03-03 | Discharge: 2024-03-03 | Disposition: A | Discharge status: Home / Self Care | Source: Ambulatory Visit | Attending: Family Medicine | Admitting: Family Medicine

## 2024-03-03 DIAGNOSIS — Z780 Asymptomatic menopausal state: Secondary | ICD-10-CM

## 2024-03-04 NOTE — Progress Notes (Signed)
 Forwarding to you. Looks like a normal bone density study.

## 2024-03-05 ENCOUNTER — Encounter: Payer: Self-pay | Admitting: Internal Medicine

## 2024-03-08 ENCOUNTER — Encounter: Payer: Self-pay | Admitting: Internal Medicine

## 2024-03-08 LAB — HM DEXA SCAN: HM Dexa Scan: -0.3

## 2024-03-14 ENCOUNTER — Other Ambulatory Visit: Payer: Self-pay | Admitting: Internal Medicine

## 2024-04-05 ENCOUNTER — Encounter: Payer: Self-pay | Admitting: Family Medicine

## 2024-04-20 ENCOUNTER — Encounter: Payer: Self-pay | Admitting: Family Medicine

## 2024-04-27 NOTE — Progress Notes (Deleted)
 Office Visit Note  Patient: Emily Page             Date of Birth: 1951-02-26           MRN: [id number]             PCP: Adelia Homestead, MD Referring: Adelia Homestead, * Visit Date: 05/11/2024 Occupation: @GUAROCC @  Subjective:    History of Present Illness: Emily Page is a 73 y.o. female with history of fibromyalgia.     Flexeril    Activities of Daily Living:  Patient reports morning stiffness for *** {minute/hour:19697}.   Patient {ACTIONS;DENIES/REPORTS:21021675::Denies} nocturnal pain.  Difficulty dressing/grooming: {ACTIONS;DENIES/REPORTS:21021675::Denies} Difficulty climbing stairs: {ACTIONS;DENIES/REPORTS:21021675::Denies} Difficulty getting out of chair: {ACTIONS;DENIES/REPORTS:21021675::Denies} Difficulty using hands for taps, buttons, cutlery, and/or writing: {ACTIONS;DENIES/REPORTS:21021675::Denies}  No Rheumatology ROS completed.   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   OSA (obstructive sleep apnea) 10/28/2016   Excessive daytime sleepiness 04/22/2016   Exertional shortness of breath 07/14/2014   Palpitations 07/14/2014   Allergic rhinitis 04/14/2014   Cervical pain 04/14/2014   Acid reflux 04/14/2014   Atypical migraine 04/14/2014   Arthralgia of multiple joints 04/14/2014   Hypertension    Chest pain, atypical 03/31/2014   PVC (premature ventricular contraction) 03/31/2014   Blood glucose elevated 02/02/2014     Past Medical History:  Diagnosis Date   Allergic rhinitis    followed by Dr Lonni Robert in the past   Fibromyalgia    followed by Dr Alejandro Amour   Foot pain, right    w intermittent swelling felt to be related to how she is walking on her foot. 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 Did have occassional PVC's   Hypertension    Migraines    followed by [facility] in past.    Ocular rosacea    per patient   OSA (obstructive sleep apnea) 10/28/2016   Very mild   PVC (premature ventricular contraction)    Raynaud's disease    followed by Dr Alvira Josephs and norvasc  has helped   Rosacea    and seborrheic dermatitis followed by Dr Arla Lab    Family History  Problem Relation Age of Onset   Hypertension Mother    Heart attack Father    Hypertension Father    Hyperlipidemia Father    Cancer Daughter        stage 3 colorectal cancer   Past Surgical History:  Procedure Laterality Date   BREAST BIOPSY Left    BREAST BIOPSY Left    CARDIAC CATHETERIZATION     EYE SURGERY Left 10/2017   cataract extraction    HIP ARTHROPLASTY  HIP SURGERY     LEFT HEART CATHETERIZATION WITH CORONARY ANGIOGRAM N/A 07/28/2014   Procedure: LEFT HEART CATHETERIZATION WITH CORONARY ANGIOGRAM;  Surgeon: Arleen Lacer, MD;  Location: [facility] CATH LAB;  Service: Cardiovascular;  Laterality: N/A;   LEG SURGERY     SPLIT NIGHT STUDY  04/10/2016   TOTAL HIP ARTHROPLASTY Right    TOTAL HIP ARTHROPLASTY Left 05/2020   Dr. France Ina   VAGINAL HYSTERECTOMY     Social History   Social History Narrative   Not on file   Immunization  History  Administered Date(s) Administered   Fluad Quad(high Dose 65+) 08/28/2022   Fluad Trivalent(High Dose 65+) 09/16/2023   Fluzone Influenza virus vaccine,trivalent (IIV3), split virus 09/07/2012   Influenza, High Dose Seasonal PF 08/29/2020   Influenza,inj,Quad PF,6+ Mos 08/24/2015, 09/24/2016, 09/09/2017, 09/24/2019   Influenza,inj,quad, With Preservative 08/16/2015, 08/20/2018   Influenza-Unspecified 09/11/2019   PFIZER(Purple Top)SARS-COV-2 Vaccination 01/03/2020, 01/28/2020   Pfizer Covid-19 Vaccine Bivalent Booster 66yrs & up 08/28/2022   Pneumococcal Conjugate-13 01/04/2016   Pneumococcal Polysaccharide-23 09/09/2017   Rsv, Bivalent, Protein Subunit Rsvpref,pf Pattricia Bores) 10/08/2022     Objective: Vital Signs: There were no vitals taken for this visit.   Physical Exam Vitals and nursing note reviewed.  Constitutional:      Appearance: She is well-developed.  HENT:     Head: Normocephalic and atraumatic.   Eyes:     Conjunctiva/sclera: Conjunctivae normal.    Cardiovascular:     Rate and Rhythm: Normal rate and regular rhythm.     Heart sounds: Normal heart sounds.  Pulmonary:     Effort: Pulmonary effort is normal.     Breath sounds: Normal breath sounds.  Abdominal:     General: Bowel sounds are normal.     Palpations: Abdomen is soft.   Musculoskeletal:     Cervical back: Normal range of motion.  Lymphadenopathy:     Cervical: No cervical adenopathy.   Skin:    General: Skin is warm and dry.     Capillary Refill: Capillary refill takes less than 2 seconds.   Neurological:     Mental Status: She is alert and oriented to person, place, and time.   Psychiatric:        Behavior: Behavior normal.      Musculoskeletal Exam: ***  CDAI Exam: CDAI Score: -- Patient Global: --; Provider Global: -- Swollen: --; Tender: -- Joint Exam 05/11/2024   No joint exam has been documented for this visit   There is currently no information documented on the  homunculus. Go to the Rheumatology activity and complete the homunculus joint exam.  Investigation: No additional findings.  Imaging: No results found.  Recent Labs: Lab Results  Component Value Date   WBC 9.0 12/18/2023   HGB 13.2 12/18/2023   PLT 317.0 12/18/2023   NA 136 12/18/2023   K 4.7 12/18/2023   CL 102 12/18/2023   CO2 27 12/18/2023   GLUCOSE 113 (H) 12/18/2023   BUN 15 12/18/2023   CREATININE 1.00 12/18/2023   BILITOT 0.4 12/18/2023   ALKPHOS 62 12/18/2023   AST 18 12/18/2023   ALT 10 12/18/2023   PROT 6.6 12/18/2023   ALBUMIN 4.0 12/18/2023   CALCIUM 9.2 12/18/2023   GFRAA >60 10/19/2018    Speciality Comments: No specialty comments available.  Procedures:  No procedures performed Allergies: Amoxil [amoxicillin], Penicillins, Tape, Sulfa antibiotics, Zanaflex [tizanidine], Polytrim [polymyxin b-trimethoprim], Wound dressing adhesive, and Voltaren  [diclofenac  sodium]   Assessment / Plan:  Visit Diagnoses: Fibromyalgia  Raynaud's disease without gangrene  Sicca syndrome (HCC)  Chronic pain of left knee  Status post bilateral total hip replacement  History of vitamin D  deficiency  History of migraine  History of hypertension  Holmes-Adie syndrome, unspecified laterality  Orders: No orders of the defined types were placed in this encounter.  No orders of the defined types were placed in this encounter.   Face-to-face time spent with patient was *** minutes. Greater than 50% of time was spent in counseling and coordination of care.  Follow-Up Instructions: No follow-ups on file.   Romayne Clubs, PA-C  Note - This record has been created using Dragon software.  Chart creation errors have been sought, but may not always  have been located. Such creation errors do not reflect on  the standard of medical care.

## 2024-05-11 ENCOUNTER — Ambulatory Visit: Payer: Medicare Other | Admitting: Physician Assistant

## 2024-05-11 DIAGNOSIS — H57059 Tonic pupil, unspecified eye: Secondary | ICD-10-CM

## 2024-05-11 DIAGNOSIS — Z8679 Personal history of other diseases of the circulatory system: Secondary | ICD-10-CM

## 2024-05-11 DIAGNOSIS — Z8639 Personal history of other endocrine, nutritional and metabolic disease: Secondary | ICD-10-CM

## 2024-05-11 DIAGNOSIS — M797 Fibromyalgia: Secondary | ICD-10-CM

## 2024-05-11 DIAGNOSIS — I7300 Raynaud's syndrome without gangrene: Secondary | ICD-10-CM

## 2024-05-11 DIAGNOSIS — Z8669 Personal history of other diseases of the nervous system and sense organs: Secondary | ICD-10-CM

## 2024-05-11 DIAGNOSIS — G8929 Other chronic pain: Secondary | ICD-10-CM

## 2024-05-11 DIAGNOSIS — M3500 Sicca syndrome, unspecified: Secondary | ICD-10-CM

## 2024-05-11 DIAGNOSIS — Z96643 Presence of artificial hip joint, bilateral: Secondary | ICD-10-CM

## 2024-05-12 NOTE — Progress Notes (Unsigned)
 Office Visit Note  Patient: Emily Page             Date of Birth: [birth year]           MRN: [id number]             PCP: Adelia Homestead, MD Referring: Adelia Homestead, * Visit Date: 05/26/2024 Occupation: @GUAROCC @  Subjective:    History of Present Illness: Emily Page is a 73 y.o. female with history of fibromyalgia.     Flexeril    Activities of Daily Living:  Patient reports morning stiffness for *** {minute/hour:19697}.   Patient {ACTIONS;DENIES/REPORTS:21021675::Denies} nocturnal pain.  Difficulty dressing/grooming: {ACTIONS;DENIES/REPORTS:21021675::Denies} Difficulty climbing stairs: {ACTIONS;DENIES/REPORTS:21021675::Denies} Difficulty getting out of chair: {ACTIONS;DENIES/REPORTS:21021675::Denies} Difficulty using hands for taps, buttons, cutlery, and/or writing: {ACTIONS;DENIES/REPORTS:21021675::Denies}  No Rheumatology ROS completed.   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   OSA (obstructive sleep apnea) 10/28/2016   Excessive daytime sleepiness 04/22/2016   Exertional shortness of breath 07/14/2014   Palpitations 07/14/2014   Allergic rhinitis 04/14/2014   Cervical pain 04/14/2014   Acid reflux 04/14/2014   Atypical migraine 04/14/2014   Arthralgia of multiple joints 04/14/2014   Hypertension    Chest pain, atypical 03/31/2014   PVC (premature ventricular contraction) 03/31/2014   Blood glucose elevated 02/02/2014     Past Medical History:  Diagnosis Date   Allergic rhinitis    followed by Dr Lonni Robert in the past   Fibromyalgia    followed by Dr Alejandro Amour   Foot pain, right    w intermittent swelling felt to be related to how she is walking on her foot. 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 Did have occassional PVC's   Hypertension    Migraines    followed [facility] in past.    Ocular rosacea    per patient   OSA (obstructive sleep apnea) 10/28/2016   Very mild   PVC (premature ventricular contraction)    Raynaud's disease    followed by Dr Alvira Josephs and norvasc  has helped   Rosacea    and seborrheic dermatitis followed by Dr Arla Lab    Family History  Problem Relation Age of Onset   Hypertension Mother    Heart attack Father    Hypertension Father    Hyperlipidemia Father    Cancer Daughter        stage 3 colorectal cancer   Past Surgical History:  Procedure Laterality Date   BREAST BIOPSY Left    BREAST BIOPSY Left    CARDIAC CATHETERIZATION     EYE SURGERY Left 10/2017   cataract extraction    HIP ARTHROPLASTY  HIP SURGERY     LEFT HEART CATHETERIZATION WITH CORONARY ANGIOGRAM N/A 07/28/2014   Procedure: LEFT HEART CATHETERIZATION WITH CORONARY ANGIOGRAM;  Surgeon: Arleen Lacer, MD;  Location: [facility] CATH LAB;  Service: Cardiovascular;  Laterality: N/A;   LEG SURGERY     SPLIT NIGHT STUDY  04/10/2016   TOTAL HIP ARTHROPLASTY Right    TOTAL HIP ARTHROPLASTY Left 05/2020   Dr. France Ina   VAGINAL HYSTERECTOMY     Social History   Social History Narrative   Not on file   Immunization  History  Administered Date(s) Administered   Fluad Quad(high Dose 65+) 08/28/2022   Fluad Trivalent(High Dose 65+) 09/16/2023   Fluzone Influenza virus vaccine,trivalent (IIV3), split virus 09/07/2012   Influenza, High Dose Seasonal PF 08/29/2020   Influenza,inj,Quad PF,6+ Mos 08/24/2015, 09/24/2016, 09/09/2017, 09/24/2019   Influenza,inj,quad, With Preservative 08/16/2015, 08/20/2018   Influenza-Unspecified 09/11/2019   PFIZER(Purple Top)SARS-COV-2 Vaccination 01/03/2020, 01/28/2020   Pfizer Covid-19 Vaccine Bivalent Booster 65yrs & up 08/28/2022   Pneumococcal Conjugate-13 01/04/2016   Pneumococcal Polysaccharide-23 09/09/2017   Rsv, Bivalent, Protein Subunit Rsvpref,pf Pattricia Bores) 10/08/2022     Objective: Vital Signs: There were no vitals taken for this visit.   Physical Exam Vitals and nursing note reviewed.  Constitutional:      Appearance: She is well-developed.  HENT:     Head: Normocephalic and atraumatic.   Eyes:     Conjunctiva/sclera: Conjunctivae normal.    Cardiovascular:     Rate and Rhythm: Normal rate and regular rhythm.     Heart sounds: Normal heart sounds.  Pulmonary:     Effort: Pulmonary effort is normal.     Breath sounds: Normal breath sounds.  Abdominal:     General: Bowel sounds are normal.     Palpations: Abdomen is soft.   Musculoskeletal:     Cervical back: Normal range of motion.  Lymphadenopathy:     Cervical: No cervical adenopathy.   Skin:    General: Skin is warm and dry.     Capillary Refill: Capillary refill takes less than 2 seconds.   Neurological:     Mental Status: She is alert and oriented to person, place, and time.   Psychiatric:        Behavior: Behavior normal.      Musculoskeletal Exam: ***  CDAI Exam: CDAI Score: -- Patient Global: --; Provider Global: -- Swollen: --; Tender: -- Joint Exam 05/26/2024   No joint exam has been documented for this visit   There is currently no information documented on the  homunculus. Go to the Rheumatology activity and complete the homunculus joint exam.  Investigation: No additional findings.  Imaging: No results found.  Recent Labs: Lab Results  Component Value Date   WBC 9.0 12/18/2023   HGB 13.2 12/18/2023   PLT 317.0 12/18/2023   NA 136 12/18/2023   K 4.7 12/18/2023   CL 102 12/18/2023   CO2 27 12/18/2023   GLUCOSE 113 (H) 12/18/2023   BUN 15 12/18/2023   CREATININE 1.00 12/18/2023   BILITOT 0.4 12/18/2023   ALKPHOS 62 12/18/2023   AST 18 12/18/2023   ALT 10 12/18/2023   PROT 6.6 12/18/2023   ALBUMIN 4.0 12/18/2023   CALCIUM 9.2 12/18/2023   GFRAA >60 10/19/2018    Speciality Comments: No specialty comments available.  Procedures:  No procedures performed Allergies: Amoxil [amoxicillin], Penicillins, Tape, Sulfa antibiotics, Zanaflex [tizanidine], Polytrim [polymyxin b-trimethoprim], Wound dressing adhesive, and Voltaren  [diclofenac  sodium]   Assessment / Plan:  Visit Diagnoses: Fibromyalgia  Sicca syndrome (HCC)  Raynaud's disease without gangrene  Chronic pain of left knee  Status post bilateral total hip replacement  History of vitamin D  deficiency  History of migraine  History of hypertension  Holmes-Adie syndrome, unspecified laterality  Orders: No orders of the defined types were placed in this encounter.  No orders of the defined types were placed in this encounter.   Face-to-face time spent with patient was *** minutes. Greater than 50% of time was spent in counseling and coordination of care.  Follow-Up Instructions: No follow-ups on file.   Romayne Clubs, PA-C  Note - This record has been created using Dragon software.  Chart creation errors have been sought, but may not always  have been located. Such creation errors do not reflect on  the standard of medical care.

## 2024-05-26 ENCOUNTER — Encounter: Payer: Self-pay | Admitting: Physician Assistant

## 2024-05-26 ENCOUNTER — Ambulatory Visit: Attending: Physician Assistant | Admitting: Physician Assistant

## 2024-05-26 VITALS — BP 125/74 | HR 84 | Resp 16 | Ht 62.5 in | Wt 151.6 lb

## 2024-05-26 DIAGNOSIS — Z8679 Personal history of other diseases of the circulatory system: Secondary | ICD-10-CM | POA: Diagnosis not present

## 2024-05-26 DIAGNOSIS — M3500 Sicca syndrome, unspecified: Secondary | ICD-10-CM | POA: Insufficient documentation

## 2024-05-26 DIAGNOSIS — Z8639 Personal history of other endocrine, nutritional and metabolic disease: Secondary | ICD-10-CM | POA: Insufficient documentation

## 2024-05-26 DIAGNOSIS — Z8669 Personal history of other diseases of the nervous system and sense organs: Secondary | ICD-10-CM | POA: Diagnosis not present

## 2024-05-26 DIAGNOSIS — M797 Fibromyalgia: Secondary | ICD-10-CM | POA: Diagnosis not present

## 2024-05-26 DIAGNOSIS — M25562 Pain in left knee: Secondary | ICD-10-CM | POA: Diagnosis not present

## 2024-05-26 DIAGNOSIS — I7300 Raynaud's syndrome without gangrene: Secondary | ICD-10-CM | POA: Insufficient documentation

## 2024-05-26 DIAGNOSIS — H57059 Tonic pupil, unspecified eye: Secondary | ICD-10-CM | POA: Insufficient documentation

## 2024-05-26 DIAGNOSIS — Z96643 Presence of artificial hip joint, bilateral: Secondary | ICD-10-CM | POA: Diagnosis not present

## 2024-05-26 DIAGNOSIS — G8929 Other chronic pain: Secondary | ICD-10-CM | POA: Diagnosis not present

## 2024-05-26 MED ORDER — CYCLOBENZAPRINE HCL 5 MG PO TABS: ORAL_TABLET | ORAL | 2 refills | Status: DC

## 2024-06-09 ENCOUNTER — Other Ambulatory Visit: Payer: Self-pay | Admitting: Internal Medicine

## 2024-06-16 ENCOUNTER — Ambulatory Visit: Payer: Medicare Other | Admitting: Internal Medicine

## 2024-06-21 ENCOUNTER — Ambulatory Visit (INDEPENDENT_AMBULATORY_CARE_PROVIDER_SITE_OTHER): Admitting: Internal Medicine

## 2024-06-21 VITALS — BP 120/80 | HR 78 | Temp 98.0°F | Ht 62.0 in | Wt 152.0 lb

## 2024-06-21 DIAGNOSIS — R739 Hyperglycemia, unspecified: Secondary | ICD-10-CM | POA: Diagnosis not present

## 2024-06-21 DIAGNOSIS — G901 Familial dysautonomia [Riley-Day]: Secondary | ICD-10-CM | POA: Diagnosis not present

## 2024-06-21 DIAGNOSIS — E559 Vitamin D deficiency, unspecified: Secondary | ICD-10-CM | POA: Diagnosis not present

## 2024-06-21 DIAGNOSIS — M797 Fibromyalgia: Secondary | ICD-10-CM | POA: Diagnosis not present

## 2024-06-21 DIAGNOSIS — I493 Ventricular premature depolarization: Secondary | ICD-10-CM

## 2024-06-21 LAB — COMPREHENSIVE METABOLIC PANEL WITH GFR
ALT: 12 U/L (ref 0–35)
AST: 16 U/L (ref 0–37)
Albumin: 4.2 g/dL (ref 3.5–5.2)
Alkaline Phosphatase: 54 U/L (ref 39–117)
BUN: 15 mg/dL (ref 6–23)
CO2: 21 meq/L (ref 19–32)
Calcium: 9 mg/dL (ref 8.4–10.5)
Chloride: 103 meq/L (ref 96–112)
Creatinine, Ser: 1.08 mg/dL (ref 0.40–1.20)
GFR: 50.91 mL/min — ABNORMAL LOW (ref >60.00)
Glucose, Bld: 99 mg/dL (ref 70–99)
Potassium: 4.2 meq/L (ref 3.5–5.1)
Sodium: 137 meq/L (ref 135–145)
Total Bilirubin: 0.4 mg/dL (ref 0.2–1.2)
Total Protein: 6.6 g/dL (ref 6.0–8.3)

## 2024-06-21 LAB — HEMOGLOBIN A1C: Hgb A1c MFr Bld: 5.7 % (ref 4.6–6.5)

## 2024-06-21 NOTE — Assessment & Plan Note (Signed)
Checking vitamin D level and adjust as needed.  

## 2024-06-21 NOTE — Assessment & Plan Note (Signed)
 Checking HgA1c and adjust as needed.

## 2024-06-21 NOTE — Progress Notes (Signed)
   Subjective:   Patient ID: Emily Page, female    DOB: 1951/09/20, 73 y.o.   MRN: [id number]  HPI The patient is a 73 YO female coming in for medical management (see A/P for details).   Review of Systems  Constitutional: Negative.   HENT: Negative.    Eyes: Negative.   Respiratory:  Negative for cough, chest tightness and shortness of breath.   Cardiovascular:  Positive for leg swelling. Negative for chest pain and palpitations.  Gastrointestinal:  Negative for abdominal distention, abdominal pain, constipation, diarrhea, nausea and vomiting.  Musculoskeletal:  Positive for myalgias.  Skin: Negative.   Neurological:  Positive for dizziness.  Psychiatric/Behavioral: Negative.      Objective:  Physical Exam Constitutional:      Appearance: She is well-developed.  HENT:     Head: Normocephalic and atraumatic.  Cardiovascular:     Rate and Rhythm: Normal rate and regular rhythm.  Pulmonary:     Effort: Pulmonary effort is normal. No respiratory distress.     Breath sounds: Normal breath sounds. No wheezing or rales.  Abdominal:     General: Bowel sounds are normal. There is no distension.     Palpations: Abdomen is soft.     Tenderness: There is no abdominal tenderness. There is no rebound.  Musculoskeletal:     Cervical back: Normal range of motion.     Right lower leg: No edema.     Left lower leg: No edema.  Skin:    General: Skin is warm and dry.  Neurological:     Mental Status: She is alert and oriented to person, place, and time.     Coordination: Coordination normal.     Vitals:   06/21/24 1400  BP: 120/80  Pulse: 78  Temp: 98 F (36.7 C)  TempSrc: Temporal  SpO2: 98%  Weight: 152 lb (68.9 kg)  Height: 5' 2 (1.575 m)    Assessment & Plan:

## 2024-06-21 NOTE — Assessment & Plan Note (Signed)
 Taking metoprolol  for prevention and having some tremoring right before taking this. It may be co-treating essential tremor. We will monitor closely.

## 2024-06-21 NOTE — Patient Instructions (Signed)
 We will check the labs today.

## 2024-06-21 NOTE — Assessment & Plan Note (Signed)
 Checking CBC and CMP. She takes flexeril  at bedtime 1/2 pill and this helps well. Stable.

## 2024-06-21 NOTE — Assessment & Plan Note (Signed)
 With some dizziness. Checking B12 and CBC and CMP.

## 2024-06-22 ENCOUNTER — Ambulatory Visit: Payer: Self-pay | Admitting: Internal Medicine

## 2024-06-22 LAB — VITAMIN D 25 HYDROXY (VIT D DEFICIENCY, FRACTURES): VITD: 51.33 ng/mL (ref 30.00–100.00)

## 2024-06-22 LAB — VITAMIN B12: Vitamin B-12: 994 pg/mL — ABNORMAL HIGH (ref 211–911)

## 2024-07-29 ENCOUNTER — Encounter: Payer: Self-pay | Admitting: Physician Assistant

## 2024-07-30 ENCOUNTER — Telehealth: Payer: Self-pay

## 2024-07-30 MED ORDER — AMLODIPINE BESYLATE 2.5 MG PO TABS: 2.5000 mg | ORAL_TABLET | Freq: Every day | ORAL | 3 refills | Status: AC

## 2024-07-30 NOTE — Telephone Encounter (Signed)
 Call to patient to discuss refills, patient last seen March 2025. Notes state She has reynaud's and off the amlodipine  made these symptoms worse and resulted in very high BP, so very reluctant to consider retry off it.  Asked patient if she tracks her BP at home, patient states she does not. Notes also state she needs 6 mo f/u due 07/2024, made appt for 08/16/24 and reordered 30 day supply of amlodipine . Patient states she has plenty of spironolactone  on hand.

## 2024-08-06 ENCOUNTER — Encounter: Payer: Self-pay | Admitting: Internal Medicine

## 2024-08-06 ENCOUNTER — Other Ambulatory Visit: Payer: Self-pay | Admitting: Medical Genetics

## 2024-08-06 DIAGNOSIS — Z23 Encounter for immunization: Secondary | ICD-10-CM | POA: Diagnosis not present

## 2024-08-06 MED ORDER — COVID-19 MRNA VAC-TRIS(PFIZER) 30 MCG/0.3ML IM SUSY: 0.3000 mL | PREFILLED_SYRINGE | Freq: Once | INTRAMUSCULAR | 0 refills | Status: AC

## 2024-08-06 NOTE — Telephone Encounter (Signed)
Pharmacy is on file

## 2024-08-15 NOTE — Progress Notes (Unsigned)
 Cardiology Office Note:  .   Date:  08/15/2024  ID:  Emily Page, DOB 1951-05-21, MRN [id number] PCP: Emily Almarie LABOR, MD  [facility] Providers Cardiologist:  Dr. Fernande {  History of Present Illness: .   Emily Page is a 73 y.o. female w/PMHx of  HTN, PVCs stereotypical Page suggestive of dysautonomia   Saw Dr. Fernande 11/10/23,  DATE TEST EF    8/15 Echo  45-50 %    9/15 Cath   Normal CA  11/16 cMRI 47 % No LGE  6/18 Echo 50-55 %    11/19 cMRI 59 %    Reports:  LH with recorded rhythms showing normal sinus during Page of LH and weakness (presumably by historical monitoring He discusses: Reducing her BB for symptoms of fatigue Ongoing  palpitations with plans for monitoring F/u on K+ of 5.0 Noted chest wall venous collateralization that may need w/u (not felt to be new perhaps by the pat?), if new, recommended that she will need some type of vascular assessment of her intrathoracic venous system   Looks like she perhaps had difficulty wearing it 2/2 skin sensitivity   No arrhythmias Low burden of ectopy Symptom tracings ST, SR, PVCs  I saw her march 2025 Palpitations wax/wane Good at symptom recognition Feels she is adequately hydrated No changes were made Re-enforced safety, lifestyle management strategies Intolerant of support/compression wear   Today's visit is scheduled as a 6 mo f/u ROS:   Symptoms tend to wax/wane, much the same as they have been over the years She does feel like she still has PVCs, burden also waxes/wanes >> not necessarily linked toi her orthostatic symptoms  She mentions today that when she stands up she feels like her heart is going fast (watch and pulse ox read low 100's), other times she finds HRs go very low towards the 50's making her feel quite sluggish, like she is wilting. When these issues are acting up she has to pace her days, take breaks.  Feels like she is adequately hydrated Amlodipine  has  been kept for her Raynaud's  No syncope No CP   Studies Reviewed: SABRA    EKG not done today   Jan 2025, monitor Findings HR  avg 76  Min 56-Max 140  PVCs Rare, less than 1%   PACs Rare, less than 1%   No SVT  episodes;  Symptoms:             Shortness of breath sinus w rate PVC>>              fatigue>> sinus with rare PVC Triggered: none          Conclusions: No significant arrhtymia noted with her albeit infrequent symptoms    10/19/2018, c.MRI IMPRESSION: 1.  Normal LV size and systolic function, EF 59%. 2. Normal RV size and systolic function, EF 64%. No evidence for ARVC. 3. No myocardial LGE, so no definitive evidence for prior MI, infiltrative disease, or myocarditis.   05/12/2017: TTE Study Conclusions  - Left ventricle: The cavity size was mildly dilated. Systolic    function was normal. The estimated ejection fraction was in the    range of 50% to 55%. Wall motion was normal; there were no    regional wall motion abnormalities. Features are consistent with    a pseudonormal left ventricular filling pattern, with concomitant    abnormal relaxation and increased filling pressure (grade 2    diastolic dysfunction).  -  Aortic valve: Trileaflet; normal thickness, mildly calcified    leaflets.  - Mitral valve: There was mild regurgitation.  - Tricuspid valve: There was mild regurgitation.  - Pulmonic valve: There was mild regurgitation.   Impressions: - Low normal LVF with EF 50-55% with mild MR, TR and PR. Compared    to prior study - LVF appears improved.    Risk Assessment/Calculations:    Physical Exam:   VS:  There were no vitals taken for this visit.   Wt Readings from Last 3 Encounters:  06/21/24 152 lb (68.9 kg)  05/26/24 151 lb 9.6 oz (68.8 kg)  02/20/24 149 lb (67.6 kg)    GEN: Well nourished, well developed in no acute distress NECK: No JVD; No carotid bruits CARDIAC: RRR, no ectopy/extrasystoles on prolonged auscultation, no murmurs, rubs,  gallops RESPIRATORY: CTA b/l without rales, wheezing or rhonchi  ABDOMEN: Soft, non-tender, non-distended EXTREMITIES: No edema; No deformity   ASSESSMENT AND PLAN: .    Lightheaded Dysautonomia (?) HTN Palpitations PVCs Rare on her monitor Benign monitor findings  Advise adequate hydration Supportive wear as tolerated (unable to wear with skin irritation) Advoid hot environments Symptom recognition   Suspect her slower rates are 2/2 PVCs She will send some watch or kardia tracings with symptoms to help w/recommendations If PVCs with perhaps up-titrate her BB  Her husband is a patient of Dr. Cindie, she would like to transition care to his service  Dispo: back in 6 mo, sooner if needed  Signed, Charlies Macario Arthur, PA-C

## 2024-08-16 ENCOUNTER — Ambulatory Visit: Attending: Physician Assistant | Admitting: Physician Assistant

## 2024-08-16 ENCOUNTER — Encounter: Payer: Self-pay | Admitting: Physician Assistant

## 2024-08-16 VITALS — BP 110/64 | HR 83 | Ht 62.0 in | Wt 153.0 lb

## 2024-08-16 DIAGNOSIS — I10 Essential (primary) hypertension: Secondary | ICD-10-CM | POA: Insufficient documentation

## 2024-08-16 DIAGNOSIS — I493 Ventricular premature depolarization: Secondary | ICD-10-CM | POA: Diagnosis not present

## 2024-08-16 DIAGNOSIS — G901 Familial dysautonomia [Riley-Day]: Secondary | ICD-10-CM | POA: Diagnosis not present

## 2024-08-16 NOTE — Patient Instructions (Signed)
 Medication Instructions:   Your physician recommends that you continue on your current medications as directed. Please refer to the Current Medication list given to you today.   *If you need a refill on your cardiac medications before your next appointment, please call your pharmacy*   Lab Work: NONE ORDERED  TODAY    If you have labs (blood work) drawn today and your tests are completely normal, you will receive your results only by: MyChart Message (if you have MyChart) OR A paper copy in the mail If you have any lab test that is abnormal or we need to change your treatment, we will call you to review the results.    Testing/Procedures: NONE ORDERED  TODAY     Follow-Up: At [facility], you and your health needs are our priority.  As part of our continuing mission to provide you with exceptional heart care, our providers are all part of one team.  This team includes your primary Cardiologist (physician) and Advanced Practice Providers or APPs (Physician Assistants and Nurse Practitioners) who all work together to provide you with the care you need, when you need it.   Your next appointment:    6 month(s)   Provider:    Ole Holts, MD    We recommend signing up for the patient portal called MyChart.  Sign up information is provided on this After Visit Summary.  MyChart is used to connect with patients for Virtual Visits (Telemedicine).  Patients are able to view lab/test results, encounter notes, upcoming appointments, etc.  Non-urgent messages can be sent to your provider as well.   To learn more about what you can do with MyChart, go to [email or web address].   Other Instructions

## 2024-08-23 ENCOUNTER — Other Ambulatory Visit

## 2024-08-23 DIAGNOSIS — Z006 Encounter for examination for normal comparison and control in clinical research program: Secondary | ICD-10-CM

## 2024-08-26 ENCOUNTER — Other Ambulatory Visit: Payer: Self-pay | Admitting: Physician Assistant

## 2024-08-26 DIAGNOSIS — M797 Fibromyalgia: Secondary | ICD-10-CM

## 2024-08-26 DIAGNOSIS — Z23 Encounter for immunization: Secondary | ICD-10-CM | POA: Diagnosis not present

## 2024-08-26 NOTE — Telephone Encounter (Signed)
 Last Fill: 05/26/2024  Next Visit: 12/08/2024  Last Visit: 05/26/2024  DX: Fibromyalgia   Current Dose per office note on 05/26/2024: Flexeril  5 mg half tablet at bedtime   Okay to refill flexeril ?

## 2024-08-31 LAB — GENECONNECT MOLECULAR SCREEN: Genetic Analysis Overall Interpretation: NEGATIVE

## 2024-09-16 DIAGNOSIS — H35372 Puckering of macula, left eye: Secondary | ICD-10-CM | POA: Diagnosis not present

## 2024-09-16 DIAGNOSIS — H2511 Age-related nuclear cataract, right eye: Secondary | ICD-10-CM | POA: Diagnosis not present

## 2024-09-16 DIAGNOSIS — H18593 Other hereditary corneal dystrophies, bilateral: Secondary | ICD-10-CM | POA: Diagnosis not present

## 2024-09-16 DIAGNOSIS — H0102B Squamous blepharitis left eye, upper and lower eyelids: Secondary | ICD-10-CM | POA: Diagnosis not present

## 2024-11-08 ENCOUNTER — Other Ambulatory Visit: Payer: Self-pay | Admitting: Internal Medicine

## 2024-11-08 ENCOUNTER — Ambulatory Visit: Admitting: Internal Medicine

## 2024-11-08 VITALS — BP 122/70 | HR 88 | Temp 98.3°F

## 2024-11-08 DIAGNOSIS — J0110 Acute frontal sinusitis, unspecified: Secondary | ICD-10-CM

## 2024-11-08 MED ORDER — AZITHROMYCIN 250 MG PO TABS: ORAL_TABLET | ORAL | 0 refills | Status: AC

## 2024-11-08 MED ORDER — METRONIDAZOLE 0.75 % EX CREA: 1.0000 | TOPICAL_CREAM | Freq: Two times a day (BID) | CUTANEOUS | 11 refills | Status: AC

## 2024-11-08 MED ORDER — PREDNISONE 20 MG PO TABS: 40.0000 mg | ORAL_TABLET | Freq: Every day | ORAL | 0 refills | Status: AC

## 2024-11-08 NOTE — Patient Instructions (Signed)
 We have sent in the prednisone  to take 2 pills daily for 5 days.  We have sent in the azithromycin  to take 2 pills on day 1, then take 1 pill daily for days 2-5.

## 2024-11-09 ENCOUNTER — Encounter: Payer: Self-pay | Admitting: Internal Medicine

## 2024-11-09 DIAGNOSIS — J019 Acute sinusitis, unspecified: Secondary | ICD-10-CM | POA: Insufficient documentation

## 2024-11-09 NOTE — Progress Notes (Signed)
° °  Subjective:   Patient ID: Emily Page, female    DOB: 1951/11/04, 73 y.o.   MRN: [id number]  The patient is a 73 YO female coming in for 13 days of cough and sinus pain/congestion. Overall worsening. Cough is productive and sinus pain moderate to severe. Ears are hurting and drainage. Using otc without relief. No fevers or chills in last few days.  Review of Systems  Constitutional:  Positive for activity change and appetite change. Negative for chills, fatigue, fever and unexpected weight change.  HENT:  Positive for congestion, ear pain, postnasal drip, rhinorrhea and sinus pressure. Negative for ear discharge, sinus pain, sneezing, sore throat, tinnitus, trouble swallowing and voice change.   Eyes: Negative.   Respiratory:  Positive for cough and shortness of breath. Negative for chest tightness and wheezing.   Cardiovascular: Negative.   Gastrointestinal: Negative.   Musculoskeletal:  Positive for myalgias.  Neurological: Negative.     Objective:  Physical Exam Constitutional:      Appearance: She is well-developed.  HENT:     Head: Normocephalic and atraumatic.     Comments: Oropharynx with redness and clear drainage, nose with swollen turbinates, TMs normal bilaterally.  Neck:     Thyroid : No thyromegaly.  Cardiovascular:     Rate and Rhythm: Normal rate and regular rhythm.  Pulmonary:     Effort: Pulmonary effort is normal. No respiratory distress.     Breath sounds: Rhonchi present. No wheezing or rales.  Abdominal:     Palpations: Abdomen is soft.  Musculoskeletal:        General: Tenderness present.     Cervical back: Normal range of motion.  Lymphadenopathy:     Cervical: No cervical adenopathy.  Skin:    General: Skin is warm and dry.  Neurological:     Mental Status: She is alert and oriented to person, place, and time.     Vitals:   11/08/24 1623  BP: 122/70  Pulse: 88  Temp: 98.3 F (36.8 C)  TempSrc: Oral  SpO2: 99%

## 2024-11-09 NOTE — Assessment & Plan Note (Signed)
 Rx for azithromycin  and prednisone  to help with 2 weeks of sinus symptoms without relief and worsening overall.

## 2024-11-29 NOTE — Progress Notes (Signed)
 "  Office Visit Note  Patient: Emily Page             Date of Birth: May 29, 1951           MRN: [id number]             PCP: Rollene Almarie LABOR, MD Referring: Rollene Almarie LABOR, MD Visit Date: 12/08/2024 Occupation: Data Unavailable  Subjective:  Pain in multiple joints  History of Present Illness: RAHA TENNISON is a 74 y.o. female with Raynauds, sicca symptoms, osteoarthritis and fibromyalgia syndrome.  She continues to have dry mouth and dry eye symptoms.  She states her Raynaud's has not been very active.  She continues to take amlodipine  2.5 mg daily.  She reports discomfort in her replaced hip joints, knee joints and her lower back.  She states the back pain goes into her hips at times.  She has generalized pain and discomfort from fibromyalgia with intermittent flares.  She continues to have fatigue and insomnia.  She complains of some thoracic and lower back pain.    Activities of Daily Living:  Patient reports morning stiffness for 0 minutes.   Patient Reports nocturnal pain.  Difficulty dressing/grooming: Denies Difficulty climbing stairs: Denies Difficulty getting out of chair: Denies Difficulty using hands for taps, buttons, cutlery, and/or writing: Denies  Review of Systems  Constitutional:  Positive for fatigue.  HENT:  Positive for mouth dryness. Negative for mouth sores.   Eyes:  Positive for dryness.  Respiratory:  Positive for shortness of breath.   Cardiovascular:  Positive for palpitations and swelling in legs/feet. Negative for chest pain.  Gastrointestinal:  Negative for blood in stool, constipation and diarrhea.  Endocrine: Negative for increased urination.  Genitourinary:  Negative for involuntary urination.  Musculoskeletal:  Positive for myalgias, muscle weakness, muscle tenderness and myalgias. Negative for joint pain, gait problem, joint pain, joint swelling and morning stiffness.  Skin:  Positive for sensitivity to sunlight. Negative for  color change, rash and hair loss.  Allergic/Immunologic: Positive for susceptible to infections.  Neurological:  Negative for dizziness and headaches.  Hematological:  Negative for swollen glands.  Psychiatric/Behavioral:  Positive for sleep disturbance. Negative for depressed mood. The patient is not nervous/anxious.     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   OSA (obstructive sleep apnea) 10/28/2016   Excessive daytime sleepiness 04/22/2016   Exertional shortness of breath 07/14/2014   Allergic rhinitis 04/14/2014   Cervical pain 04/14/2014   Acid reflux 04/14/2014   Atypical migraine 04/14/2014   Arthralgia of multiple joints 04/14/2014   Hypertension    Chest pain, atypical 03/31/2014   PVC (premature ventricular contraction) 03/31/2014   Blood glucose elevated 02/02/2014    Past Medical History:  Diagnosis Date   Allergic rhinitis    followed by Dr Vinie in the past   Fibromyalgia    followed by Dr Maryland   Foot pain, right    w intermittent swelling felt to be related to how she is walking on  her foot. evaluated by Dr Delsa (podiatrist)   GERD (gastroesophageal reflux disease)    Followed by Dr Celestia in the past   Hip dislocation, bilateral (HCC)    as infant. was corrected   History of EKG    w palpitations and nonspecific T-wave changes, stress cardiolite  6/08 showed normal LV size in a systolic function,no maximum effort stress  test with moderate work load and good HR achieved,no EKG criteria of ischemia. Did have occassional PVC's   Hypertension    Migraines    followed [facility] in past.    Ocular rosacea    per patient   OSA (obstructive sleep apnea) 10/28/2016   Very mild   PVC (premature ventricular contraction)    Raynaud's disease    followed by Dr Dolphus and norvasc  has helped   Rosacea    and seborrheic dermatitis followed by Dr Sherrie    Family History  Problem Relation Age of Onset   Hypertension Mother    Heart attack Father    Hypertension Father    Hyperlipidemia Father    Cancer Daughter        stage 3 colorectal cancer   Past Surgical History:  Procedure Laterality Date   BREAST BIOPSY Left    BREAST BIOPSY Left    CARDIAC CATHETERIZATION     EYE SURGERY Left 10/2017   cataract extraction    HIP ARTHROPLASTY     HIP SURGERY     LEFT HEART CATHETERIZATION WITH CORONARY ANGIOGRAM N/A 07/28/2014   Procedure: LEFT HEART CATHETERIZATION WITH CORONARY ANGIOGRAM;  Surgeon: Alm LELON Clay, MD;  Location: [facility] CATH LAB;  Service: Cardiovascular;  Laterality: N/A;   LEG SURGERY     SPLIT NIGHT STUDY  04/10/2016   TOTAL HIP ARTHROPLASTY Right    TOTAL HIP ARTHROPLASTY Left 05/2020   Dr. Melodi   VAGINAL HYSTERECTOMY     Social History[1] Social History   Social History Narrative   Not on file     Immunization History  Administered Date(s) Administered    sv, Bivalent, Protein Subunit Rsvpref,pf [place]) 10/08/2022   Fluad Quad(high Dose 65+) 08/28/2022   Fluad Trivalent(High Dose 65+) 09/16/2023   Fluzone Influenza virus vaccine,trivalent (IIV3), split virus 09/07/2012   INFLUENZA, HIGH DOSE SEASONAL PF 08/29/2020   Influenza,inj,Quad PF,6+ Mos 08/24/2015, 09/24/2016, 09/09/2017, 09/24/2019   Influenza,inj,quad, With Preservative 08/16/2015, 08/20/2018   Influenza-Unspecified 09/11/2019   PFIZER(Purple Top)SARS-COV-2 Vaccination 01/03/2020, 01/28/2020    Pfizer Covid-19 Vaccine Bivalent Booster 69yrs & up 08/28/2022   Pneumococcal Conjugate-13 01/04/2016   Pneumococcal Polysaccharide-23 09/09/2017     Objective: Vital Signs: BP (!) 151/81   Pulse 77   Temp (!) 97.4 F (36.3 C)   Resp 14   Ht 5' 3 (1.6 m)   Wt 157 lb 3.2 oz (71.3 kg)   BMI 27.85 kg/m    Physical Exam Vitals and nursing note reviewed.  Constitutional:      Appearance: She is well-developed.  HENT:     Head: Normocephalic and atraumatic.  Eyes:     Conjunctiva/sclera: Conjunctivae normal.  Cardiovascular:     Rate and Rhythm: Normal rate and regular rhythm.     Heart sounds: Normal heart sounds.  Pulmonary:     Effort: Pulmonary effort is normal.     Breath sounds: Normal breath sounds.  Abdominal:     General: Bowel sounds are normal.     Palpations: Abdomen is soft.  Musculoskeletal:     Cervical back: Normal  range of motion.  Lymphadenopathy:     Cervical: No cervical adenopathy.  Skin:    General: Skin is warm and dry.     Capillary Refill: Capillary refill takes less than 2 seconds.  Neurological:     Mental Status: She is alert and oriented to person, place, and time.  Psychiatric:        Behavior: Behavior normal.      Musculoskeletal Exam: She had limited lateral rotation of the cervical spine.  Thoracic kyphosis was noted.  She has some tenderness around the thoracic region.  She had discomfort range of motion of the lumbar spine.  Shoulders, elbows, wrist joints, MCPs PIPs and DIPs were in good range of motion without any discomfort.  No synovitis was noted.  PIP and DIP thickening was noted.  Hip joints were replaced and were in good range of motion.  Knee joints in good range of motion without any warmth swelling or effusion.  There was no tenderness over ankles or MTPs.  CDAI Exam: CDAI Score: -- Patient Global: --; Provider Global: -- Swollen: --; Tender: -- Joint Exam 12/08/2024   No joint exam has been documented for this visit    There is currently no information documented on the homunculus. Go to the Rheumatology activity and complete the homunculus joint exam.  Investigation: No additional findings.  Imaging: No results found.  Recent Labs: Lab Results  Component Value Date   WBC 9.0 12/18/2023   HGB 13.2 12/18/2023   PLT 317.0 12/18/2023   NA 137 06/21/2024   K 4.2 06/21/2024   CL 103 06/21/2024   CO2 21 06/21/2024   GLUCOSE 99 06/21/2024   BUN 15 06/21/2024   CREATININE 1.08 06/21/2024   BILITOT 0.4 06/21/2024   ALKPHOS 54 06/21/2024   AST 16 06/21/2024   ALT 12 06/21/2024   PROT 6.6 06/21/2024   ALBUMIN 4.2 06/21/2024   CALCIUM 9.0 06/21/2024   GFRAA >60 10/19/2018    Speciality Comments: No specialty comments available.  Procedures:  No procedures performed Allergies: Amoxil [amoxicillin], Penicillins, Tape, Sulfa antibiotics, Zanaflex [tizanidine], Polytrim [polymyxin b-trimethoprim], Wound dressing adhesive, and Voltaren  [diclofenac  sodium]   Assessment / Plan:     Visit Diagnoses: Sicca syndrome-she continues to have dry mouth and dry eye symptoms.  Over-the-counter products were discussed.  Salivary gland massage was discussed.  Raynaud's disease without gangrene -she states Raynauds symptoms are mild.  She remains on amlodipine  2.5 mg 1 tablet by mouth daily.  Keeping core temperature warm and warm clothing was discussed.  She was advised to contact us  if she develops any digital ulcers.  Chronic pain of left knee-she has intermittent discomfort.  No warmth swelling or effusion was noted.  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  She had good range of motion without any discomfort.  However she notices some discomfort off-and-on.  Chronic bilateral thoracic back pain-she complains of some discomfort in the thoracic region.  She had no point tenderness.  A handout on thoracic exercises was given.  Chronic midline low back pain without  sciatica-complaints of chronic lower back pain.  A handout on lumbar spine exercise was given.  History of vitamin D  deficiency  Fibromyalgia -she continues to have generalized pain and discomfort fibromyalgia with intermittent flares.  Need for regular exercise and stretches was discussed.  Flexeril  5 mg half tablet at bedtime which helps to alleviate her myofascial pain and to help her sleep better at night.  Primary insomnia-  sleep hygiene was discussed.  Other fatigue-related to insomnia and fibromyalgia.  History of hypertension-blood pressure was elevated at 151/81.  Repeat blood pressure was 147/75.  Patient states that she recently traveled and did not have much rest.  She was advised to monitor pressure closely and follow-up with her PCP.  History of migraine  Holmes-Adie syndrome, unspecified laterality  Orders: No orders of the defined types were placed in this encounter.  No orders of the defined types were placed in this encounter.    Follow-Up Instructions: Return in about 6 months (around 06/07/2025) for Raynauds, FMS.   Maya Nash, MD  Note - This record has been created using Animal nutritionist.  Chart creation errors have been sought, but may not always  have been located. Such creation errors do not reflect on  the standard of medical care.     [1]  Social History Tobacco Use   Smoking status: Never    Passive exposure: Past   Smokeless tobacco: Never   Tobacco comments:    never used tobacco  Vaping Use   Vaping status: Never Used  Substance Use Topics   Alcohol use: Yes    Alcohol/week: 1.0 standard drink of alcohol    Types: 1 Glasses of wine per week    Comment: per day   Drug use: Never   "

## 2024-11-30 ENCOUNTER — Other Ambulatory Visit: Payer: Self-pay | Admitting: Physician Assistant

## 2024-11-30 DIAGNOSIS — M797 Fibromyalgia: Secondary | ICD-10-CM

## 2024-11-30 NOTE — Telephone Encounter (Signed)
 Last Fill: 08/26/2024  Next Visit: 12/08/2024  Last Visit: 05/26/2024  DX: Fibromyalgia   Current Dose per office note on 05/26/2024: Flexeril  5 mg half tablet at bedtime   Okay to refill flexeril ?

## 2024-12-08 ENCOUNTER — Encounter: Payer: Self-pay | Admitting: Rheumatology

## 2024-12-08 ENCOUNTER — Ambulatory Visit: Attending: Rheumatology | Admitting: Rheumatology

## 2024-12-08 VITALS — BP 147/75 | HR 76 | Temp 97.4°F | Resp 14 | Ht 63.0 in | Wt 157.2 lb

## 2024-12-08 DIAGNOSIS — Z8679 Personal history of other diseases of the circulatory system: Secondary | ICD-10-CM | POA: Insufficient documentation

## 2024-12-08 DIAGNOSIS — F5101 Primary insomnia: Secondary | ICD-10-CM | POA: Insufficient documentation

## 2024-12-08 DIAGNOSIS — M546 Pain in thoracic spine: Secondary | ICD-10-CM | POA: Insufficient documentation

## 2024-12-08 DIAGNOSIS — R5383 Other fatigue: Secondary | ICD-10-CM | POA: Diagnosis not present

## 2024-12-08 DIAGNOSIS — H57059 Tonic pupil, unspecified eye: Secondary | ICD-10-CM | POA: Diagnosis present

## 2024-12-08 DIAGNOSIS — Z8669 Personal history of other diseases of the nervous system and sense organs: Secondary | ICD-10-CM | POA: Diagnosis not present

## 2024-12-08 DIAGNOSIS — M3500 Sicca syndrome, unspecified: Secondary | ICD-10-CM | POA: Insufficient documentation

## 2024-12-08 DIAGNOSIS — M25562 Pain in left knee: Secondary | ICD-10-CM | POA: Insufficient documentation

## 2024-12-08 DIAGNOSIS — M545 Low back pain, unspecified: Secondary | ICD-10-CM | POA: Diagnosis not present

## 2024-12-08 DIAGNOSIS — Z96643 Presence of artificial hip joint, bilateral: Secondary | ICD-10-CM | POA: Insufficient documentation

## 2024-12-08 DIAGNOSIS — M797 Fibromyalgia: Secondary | ICD-10-CM | POA: Diagnosis not present

## 2024-12-08 DIAGNOSIS — I7300 Raynaud's syndrome without gangrene: Secondary | ICD-10-CM | POA: Insufficient documentation

## 2024-12-08 DIAGNOSIS — Z8639 Personal history of other endocrine, nutritional and metabolic disease: Secondary | ICD-10-CM | POA: Insufficient documentation

## 2024-12-08 DIAGNOSIS — G8929 Other chronic pain: Secondary | ICD-10-CM | POA: Insufficient documentation

## 2024-12-08 NOTE — Patient Instructions (Addendum)
 Low Back Sprain or Strain Rehab Ask your health care provider which exercises are safe for you. Do exercises exactly as told by your health care provider and adjust them as directed. It is normal to feel mild stretching, pulling, tightness, or discomfort as you do these exercises. Stop right away if you feel sudden pain or your pain gets worse. Do not begin these exercises until told by your health care provider. Stretching and range-of-motion exercises These exercises warm up your muscles and joints and improve the movement and flexibility of your back. These exercises also help to relieve pain, numbness, and tingling. Lumbar rotation  Lie on your back on a firm bed or the floor with your knees bent. Straighten your arms out to your sides so each arm forms a 90-degree angle (right angle) with a side of your body. Slowly move (rotate) both of your knees to one side of your body until you feel a stretch in your lower back (lumbar). Try not to let your shoulders lift off the floor. Hold this position for __________ seconds. Tense your abdominal muscles and slowly move your knees back to the starting position. Repeat this exercise on the other side of your body. Repeat __________ times. Complete this exercise __________ times a day. Single knee to chest  Lie on your back on a firm bed or the floor with both legs straight. Bend one of your knees. Use your hands to move your knee up toward your chest until you feel a gentle stretch in your lower back and buttock. Hold your leg in this position by holding on to the front of your knee. Keep your other leg as straight as possible. Hold this position for __________ seconds. Slowly return to the starting position. Repeat with your other leg. Repeat __________ times. Complete this exercise __________ times a day. Prone extension on elbows  Lie on your abdomen on a firm bed or the floor (prone position). Prop yourself up on your elbows. Use your arms  to help lift your chest up until you feel a gentle stretch in your abdomen and your lower back. This will place some of your body weight on your elbows. If this is uncomfortable, try stacking pillows under your chest. Your hips should stay down, against the surface that you are lying on. Keep your hip and back muscles relaxed. Hold this position for __________ seconds. Slowly relax your upper body and return to the starting position. Repeat __________ times. Complete this exercise __________ times a day. Strengthening exercises These exercises build strength and endurance in your back. Endurance is the ability to use your muscles for a long time, even after they get tired. Pelvic tilt This exercise strengthens the muscles that lie deep in the abdomen. Lie on your back on a firm bed or the floor with your legs extended. Bend your knees so they are pointing toward the ceiling and your feet are flat on the floor. Tighten your lower abdominal muscles to press your lower back against the floor. This motion will tilt your pelvis so your tailbone points up toward the ceiling instead of pointing to your feet or the floor. To help with this exercise, you may place a small towel under your lower back and try to push your back into the towel. Hold this position for __________ seconds. Let your muscles relax completely before you repeat this exercise. Repeat __________ times. Complete this exercise __________ times a day. Alternating arm and leg raises  Get on your hands  and knees on a firm surface. If you are on a hard floor, you may want to use padding, such as an exercise mat, to cushion your knees. Line up your arms and legs. Your hands should be directly below your shoulders, and your knees should be directly below your hips. Lift your left leg behind you. At the same time, raise your right arm and straighten it in front of you. Do not lift your leg higher than your hip. Do not lift your arm higher  than your shoulder. Keep your abdominal and back muscles tight. Keep your hips facing the ground. Do not arch your back. Keep your balance carefully, and do not hold your breath. Hold this position for __________ seconds. Slowly return to the starting position. Repeat with your right leg and your left arm. Repeat __________ times. Complete this exercise __________ times a day. Abdominal set with straight leg raise  Lie on your back on a firm bed or the floor. Bend one of your knees and keep your other leg straight. Tense your abdominal muscles and lift your straight leg up, 4-6 inches (10-15 cm) off the ground. Keep your abdominal muscles tight and hold this position for __________ seconds. Do not hold your breath. Do not arch your back. Keep it flat against the ground. Keep your abdominal muscles tense as you slowly lower your leg back to the starting position. Repeat with your other leg. Repeat __________ times. Complete this exercise __________ times a day. Single leg lower with bent knees Lie on your back on a firm bed or the floor. Tense your abdominal muscles and lift your feet off the floor, one foot at a time, so your knees and hips are bent in 90-degree angles (right angles). Your knees should be over your hips and your lower legs should be parallel to the floor. Keeping your abdominal muscles tense and your knee bent, slowly lower one of your legs so your toe touches the ground. Lift your leg back up to return to the starting position. Do not hold your breath. Do not let your back arch. Keep your back flat against the ground. Repeat with your other leg. Repeat __________ times. Complete this exercise __________ times a day. Posture and body mechanics Good posture and healthy body mechanics can help to relieve stress in your body's tissues and joints. Body mechanics refers to the movements and positions of your body while you do your daily activities. Posture is part of body  mechanics. Good posture means: Your spine is in its natural S-curve position (neutral). Your shoulders are pulled back slightly. Your head is not tipped forward (neutral). Follow these guidelines to improve your posture and body mechanics in your everyday activities. Standing  When standing, keep your spine neutral and your feet about hip-width apart. Keep a slight bend in your knees. Your ears, shoulders, and hips should line up. When you do a task in which you stand in one place for a long time, place one foot up on a stable object that is 2-4 inches (5-10 cm) high, such as a footstool. This helps keep your spine neutral. Sitting  When sitting, keep your spine neutral and keep your feet flat on the floor. Use a footrest, if necessary, and keep your thighs parallel to the floor. Avoid rounding your shoulders, and avoid tilting your head forward. When working at a desk or a computer, keep your desk at a height where your hands are slightly lower than your elbows. Slide your  chair under your desk so you are close enough to maintain good posture. When working at a computer, place your monitor at a height where you are looking straight ahead and you do not have to tilt your head forward or downward to look at the screen. Resting When lying down and resting, avoid positions that are most painful for you. If you have pain with activities such as sitting, bending, stooping, or squatting, lie in a position in which your body does not bend very much. For example, avoid curling up on your side with your arms and knees near your chest (fetal position). If you have pain with activities such as standing for a long time or reaching with your arms, lie with your spine in a neutral position and bend your knees slightly. Try the following positions: Lying on your side with a pillow between your knees. Lying on your back with a pillow under your knees. Lifting  When lifting objects, keep your feet at least  shoulder-width apart and tighten your abdominal muscles. Bend your knees and hips and keep your spine neutral. It is important to lift using the strength of your legs, not your back. Do not lock your knees straight out. Always ask for help to lift heavy or awkward objects. This information is not intended to replace advice given to you by your health care provider. Make sure you discuss any questions you have with your health care provider. Document Revised: 03/17/2023 Document Reviewed: 01/29/2021 [employer] Patient Education  2024 [employer] Thoracic Strain Rehab Ask your health care provider which exercises are safe for you. Do exercises exactly as told by your provider and adjust them as directed. It is normal to feel mild stretching, pulling, tightness, or discomfort as you do these exercises. Stop right away if you feel sudden pain or your pain gets worse. Do not begin these exercises until told by your provider. Stretching and range-of-motion exercise This exercise warms up your muscles and joints and improves the movement and flexibility of your back and shoulders. This exercise also helps to relieve pain. Chest and spine stretch  Lie down on your back on a firm surface. Roll a towel or a small blanket so it is about 4 inches (10 cm) in diameter. Put the towel under the middle of your back so it is under your spine, but not under your shoulder blades. Put your hands behind your head and let your elbows fall to your sides. This will increase your stretch. Take a deep breath (inhale). Hold for __________ seconds. Relax after you breathe out (exhale). Repeat __________ times. Complete this exercise __________ times a day. Strengthening exercises These exercises build strength and endurance in your back and your shoulder blade muscles. Endurance is the ability to use your muscles for a long time, even after they get tired. Alternating arm and leg raises  Get on your hands and knees on a  firm surface. If you are on a hard floor, you may want to use padding, such as an exercise mat, to cushion your knees. Line up your arms and legs. Your hands should be directly below your shoulders, and your knees should be directly below your hips. Lift your left leg behind you. At the same time, raise your right arm and straighten it in front of you. Do not lift your leg higher than your hip. Do not lift your arm higher than your shoulder. Keep your abdominal and back muscles tight. Keep your hips facing the ground.  Do not arch your back. Carefully stay balanced. Do not hold your breath. Hold for __________ seconds. Slowly return to the starting position and repeat with your right leg and your left arm. Repeat __________ times. Complete this exercise __________ times a day. Straight arm rows This exercise is also called the shoulder extension exercise. Stand with your feet shoulder width apart. Secure an exercise band to a stable object in front of you so the band is at or above shoulder height. Hold one end of the exercise band in each hand. Straighten your elbows and lift your hands up to shoulder height. Step back, away from the secured end of the exercise band, until the band stretches. Squeeze your shoulder blades together and pull your hands down to the sides of your thighs. Stop when your hands are straight down by your sides. This is shoulder extension. Do not let your hands go behind your body. Hold for __________ seconds. Slowly return to the starting position. Repeat __________ times. Complete this exercise __________ times a day. Rowing scapular retraction This is an exercise in which the shoulder blades (scapulae) are pulled toward each other (retraction). Sit in a stable chair without armrests, or stand up. Secure an exercise band to a stable object in front of you so the band is at shoulder height. Hold one end of the exercise band in each hand. Your palms should face  toward each other. Bring your arms out straight in front of you. Step back, away from the secured end of the exercise band, until the band stretches. Pull the band backward. As you do this, bend your elbows and squeeze your shoulder blades together, but avoid letting the rest of your body move. Do not shrug your shoulders upward while you do this. Stop when your elbows are at your sides or slightly behind your body. Hold for __________ seconds. Slowly straighten your arms to return to the starting position. Repeat __________ times. Complete this exercise __________ times a day. Posture and body mechanics Good posture and healthy body mechanics can help to relieve stress in your body's tissues and joints. Body mechanics refers to the movements and positions of your body while you do your daily activities. Posture is part of body mechanics. Good posture means: Your spine is in its natural S-curve position (neutral). Your shoulders are pulled back slightly. Your head is not tipped forward. Follow these guidelines to improve your posture and body mechanics in your everyday activities. Standing  When standing, keep your spine neutral and your feet about hip width apart. Keep a slight bend in your knees. Your ears, shoulders, and hips should line up with each other. When you do a task in which you lean forward while standing in one place for a long time, place one foot up on a stable object that is 2-4 inches (5-10 cm) high, such as a footstool. This helps keep your spine neutral. Sitting  When sitting, keep your spine neutral and keep your feet flat on the floor. Use a footrest if needed. Keep your thighs parallel to the floor. Avoid rounding your shoulders, and avoid tilting your head forward. When working at a desk or a computer, keep your desk at a height where your hands are slightly lower than your elbows. Slide your chair under your desk so you are close enough to maintain good posture. When  working at a computer, place your monitor at a height where you are looking straight ahead and you do not have to  tilt your head forward or downward to look at the screen. Resting When lying down and resting, avoid positions that are most painful for you. If you have pain with activities such as sitting, bending, stooping, or squatting (flexion-basedactivities), lie in a position in which your body does not bend very much. For example, avoid curling up on your side with your arms and knees near your chest (fetal position). If you have pain with activities such as standing for a long time or reaching with your arms (extension-basedactivities), lie with your spine in a neutral position and bend your knees slightly. Try the following positions: Lie on your side with a pillow between your knees. Lie on your back with a pillow under your knees.  Lifting  When lifting objects, keep your feet at least shoulder width apart and tighten your abdominal muscles. Bend your knees and hips and keep your spine neutral. It is important to lift using the strength of your legs, not your back. Do not lock your knees straight out. Always ask for help to lift heavy or awkward objects. This information is not intended to replace advice given to you by your health care provider. Make sure you discuss any questions you have with your health care provider. Document Revised: 04/25/2023 Document Reviewed: 07/01/2022 [employer] Patient Education  2024 [employer].

## 2024-12-14 ENCOUNTER — Other Ambulatory Visit: Payer: Self-pay | Admitting: Internal Medicine

## 2024-12-14 ENCOUNTER — Other Ambulatory Visit: Payer: Self-pay | Admitting: Physician Assistant

## 2024-12-20 MED ORDER — METOPROLOL SUCCINATE ER 100 MG PO TB24: 100.0000 mg | ORAL_TABLET | Freq: Every day | ORAL | 3 refills | Status: AC

## 2024-12-21 ENCOUNTER — Ambulatory Visit: Admitting: Internal Medicine

## 2025-02-24 ENCOUNTER — Ambulatory Visit

## 2025-03-16 ENCOUNTER — Ambulatory Visit

## 2025-06-08 ENCOUNTER — Ambulatory Visit: Admitting: Rheumatology
